# Patient Record
Sex: Female | Born: 1940 | Race: White | Hispanic: No | Smoking: Former smoker
Health system: Southern US, Community
[De-identification: ages and names within clinical notes are randomized; demographics above are authoritative.]

## PROBLEM LIST (undated history)

## (undated) DIAGNOSIS — I10 Essential (primary) hypertension: Secondary | ICD-10-CM

## (undated) DIAGNOSIS — F329 Major depressive disorder, single episode, unspecified: Secondary | ICD-10-CM

## (undated) DIAGNOSIS — K219 Gastro-esophageal reflux disease without esophagitis: Secondary | ICD-10-CM

## (undated) DIAGNOSIS — E119 Type 2 diabetes mellitus without complications: Secondary | ICD-10-CM

## (undated) DIAGNOSIS — F32A Depression, unspecified: Secondary | ICD-10-CM

## (undated) DIAGNOSIS — J189 Pneumonia, unspecified organism: Secondary | ICD-10-CM

## (undated) DIAGNOSIS — Z87442 Personal history of urinary calculi: Secondary | ICD-10-CM

## (undated) DIAGNOSIS — I2510 Atherosclerotic heart disease of native coronary artery without angina pectoris: Secondary | ICD-10-CM

## (undated) DIAGNOSIS — I209 Angina pectoris, unspecified: Secondary | ICD-10-CM

## (undated) DIAGNOSIS — H269 Unspecified cataract: Secondary | ICD-10-CM

## (undated) DIAGNOSIS — F419 Anxiety disorder, unspecified: Secondary | ICD-10-CM

## (undated) DIAGNOSIS — M1990 Unspecified osteoarthritis, unspecified site: Secondary | ICD-10-CM

## (undated) DIAGNOSIS — N189 Chronic kidney disease, unspecified: Secondary | ICD-10-CM

## (undated) DIAGNOSIS — M2040 Other hammer toe(s) (acquired), unspecified foot: Secondary | ICD-10-CM

## (undated) DIAGNOSIS — R0609 Other forms of dyspnea: Secondary | ICD-10-CM

## (undated) DIAGNOSIS — E785 Hyperlipidemia, unspecified: Secondary | ICD-10-CM

## (undated) DIAGNOSIS — R55 Syncope and collapse: Principal | ICD-10-CM

## (undated) HISTORY — DX: Pneumonia, unspecified organism: J18.9

## (undated) HISTORY — DX: Gastro-esophageal reflux disease without esophagitis: K21.9

## (undated) HISTORY — DX: Depression, unspecified: F32.A

## (undated) HISTORY — DX: Hyperlipidemia, unspecified: E78.5

## (undated) HISTORY — DX: Unspecified cataract: H26.9

## (undated) HISTORY — DX: Other hammer toe(s) (acquired), unspecified foot: M20.40

## (undated) HISTORY — PX: EYE SURGERY: SHX253

## (undated) HISTORY — DX: Essential (primary) hypertension: I10

## (undated) HISTORY — DX: Atherosclerotic heart disease of native coronary artery without angina pectoris: I25.10

## (undated) HISTORY — PX: CATARACT EXTRACTION W/ INTRAOCULAR LENS  IMPLANT, BILATERAL: SHX1307

## (undated) HISTORY — DX: Major depressive disorder, single episode, unspecified: F32.9

## (undated) HISTORY — PX: APPENDECTOMY: SHX54

## (undated) HISTORY — DX: Chronic kidney disease, unspecified: N18.9

---
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
## 1969-08-27 HISTORY — PX: ABDOMINAL HYSTERECTOMY: SHX81

## 1998-07-29 ENCOUNTER — Ambulatory Visit (HOSPITAL_COMMUNITY): Admission: RE | Admit: 1998-07-29 | Discharge: 1998-07-29 | Payer: Self-pay | Admitting: Endocrinology

## 1998-11-11 ENCOUNTER — Inpatient Hospital Stay (HOSPITAL_COMMUNITY): Admission: EM | Admit: 1998-11-11 | Discharge: 1998-11-13 | Payer: Self-pay | Admitting: Emergency Medicine

## 1998-12-31 ENCOUNTER — Other Ambulatory Visit: Admission: RE | Admit: 1998-12-31 | Discharge: 1998-12-31 | Payer: Self-pay | Admitting: Gastroenterology

## 1999-01-09 ENCOUNTER — Ambulatory Visit (HOSPITAL_COMMUNITY): Admission: RE | Admit: 1999-01-09 | Discharge: 1999-01-09 | Payer: Self-pay | Admitting: Gastroenterology

## 1999-01-26 ENCOUNTER — Ambulatory Visit (HOSPITAL_COMMUNITY): Admission: RE | Admit: 1999-01-26 | Discharge: 1999-01-26 | Payer: Self-pay | Admitting: Gastroenterology

## 1999-06-24 ENCOUNTER — Ambulatory Visit (HOSPITAL_COMMUNITY): Admission: RE | Admit: 1999-06-24 | Discharge: 1999-06-24 | Payer: Self-pay | Admitting: Endocrinology

## 1999-06-24 ENCOUNTER — Encounter: Payer: Self-pay | Admitting: Endocrinology

## 2000-06-17 ENCOUNTER — Ambulatory Visit (HOSPITAL_COMMUNITY): Admission: RE | Admit: 2000-06-17 | Discharge: 2000-06-17 | Payer: Self-pay | Admitting: Cardiology

## 2000-08-24 ENCOUNTER — Ambulatory Visit (HOSPITAL_COMMUNITY): Admission: RE | Admit: 2000-08-24 | Discharge: 2000-08-24 | Payer: Self-pay | Admitting: Family Medicine

## 2000-08-24 ENCOUNTER — Encounter: Payer: Self-pay | Admitting: Family Medicine

## 2000-10-25 ENCOUNTER — Encounter (INDEPENDENT_AMBULATORY_CARE_PROVIDER_SITE_OTHER): Payer: Self-pay

## 2000-10-25 ENCOUNTER — Other Ambulatory Visit: Admission: RE | Admit: 2000-10-25 | Discharge: 2000-10-25 | Payer: Self-pay | Admitting: Gastroenterology

## 2001-12-08 ENCOUNTER — Ambulatory Visit (HOSPITAL_COMMUNITY): Admission: RE | Admit: 2001-12-08 | Discharge: 2001-12-08 | Payer: Self-pay | Admitting: Family Medicine

## 2001-12-08 ENCOUNTER — Encounter: Payer: Self-pay | Admitting: Family Medicine

## 2001-12-22 ENCOUNTER — Observation Stay (HOSPITAL_COMMUNITY): Admission: AD | Admit: 2001-12-22 | Discharge: 2001-12-26 | Payer: Self-pay | Admitting: Cardiology

## 2002-04-24 ENCOUNTER — Encounter: Payer: Self-pay | Admitting: Family Medicine

## 2002-04-24 ENCOUNTER — Encounter: Admission: RE | Admit: 2002-04-24 | Discharge: 2002-04-24 | Payer: Self-pay | Admitting: Family Medicine

## 2002-12-10 ENCOUNTER — Encounter: Payer: Self-pay | Admitting: Family Medicine

## 2002-12-10 ENCOUNTER — Ambulatory Visit (HOSPITAL_COMMUNITY): Admission: RE | Admit: 2002-12-10 | Discharge: 2002-12-10 | Payer: Self-pay | Admitting: Family Medicine

## 2003-04-15 ENCOUNTER — Other Ambulatory Visit: Admission: RE | Admit: 2003-04-15 | Discharge: 2003-04-15 | Payer: Self-pay | Admitting: Family Medicine

## 2004-11-04 ENCOUNTER — Ambulatory Visit: Payer: Self-pay | Admitting: Family Medicine

## 2004-12-30 ENCOUNTER — Ambulatory Visit: Payer: Self-pay | Admitting: Family Medicine

## 2004-12-31 ENCOUNTER — Ambulatory Visit: Payer: Self-pay | Admitting: Cardiology

## 2005-01-07 ENCOUNTER — Ambulatory Visit: Payer: Self-pay

## 2005-01-09 ENCOUNTER — Encounter: Admission: RE | Admit: 2005-01-09 | Discharge: 2005-01-09 | Payer: Self-pay | Admitting: Family Medicine

## 2005-01-12 ENCOUNTER — Encounter: Admission: RE | Admit: 2005-01-12 | Discharge: 2005-01-12 | Payer: Self-pay | Admitting: Family Medicine

## 2005-01-14 ENCOUNTER — Ambulatory Visit: Payer: Self-pay | Admitting: Gastroenterology

## 2005-01-15 ENCOUNTER — Ambulatory Visit: Payer: Self-pay | Admitting: Gastroenterology

## 2005-01-19 ENCOUNTER — Ambulatory Visit: Payer: Self-pay | Admitting: Gastroenterology

## 2005-01-19 ENCOUNTER — Ambulatory Visit (HOSPITAL_COMMUNITY): Admission: RE | Admit: 2005-01-19 | Discharge: 2005-01-19 | Payer: Self-pay | Admitting: Gastroenterology

## 2005-01-27 ENCOUNTER — Ambulatory Visit: Payer: Self-pay | Admitting: Family Medicine

## 2005-01-27 ENCOUNTER — Ambulatory Visit: Payer: Self-pay | Admitting: Cardiology

## 2005-01-27 ENCOUNTER — Inpatient Hospital Stay (HOSPITAL_COMMUNITY): Admission: EM | Admit: 2005-01-27 | Discharge: 2005-01-28 | Payer: Self-pay | Admitting: Emergency Medicine

## 2005-02-03 ENCOUNTER — Ambulatory Visit: Payer: Self-pay | Admitting: Family Medicine

## 2005-02-14 ENCOUNTER — Ambulatory Visit: Payer: Self-pay | Admitting: Internal Medicine

## 2005-02-15 ENCOUNTER — Inpatient Hospital Stay (HOSPITAL_COMMUNITY): Admission: EM | Admit: 2005-02-15 | Discharge: 2005-02-16 | Payer: Self-pay | Admitting: Emergency Medicine

## 2005-02-16 ENCOUNTER — Encounter: Payer: Self-pay | Admitting: Cardiology

## 2005-03-23 ENCOUNTER — Ambulatory Visit: Payer: Self-pay | Admitting: Family Medicine

## 2005-05-14 ENCOUNTER — Ambulatory Visit: Payer: Self-pay | Admitting: Family Medicine

## 2005-09-13 ENCOUNTER — Ambulatory Visit: Payer: Self-pay | Admitting: Internal Medicine

## 2005-09-16 ENCOUNTER — Ambulatory Visit: Payer: Self-pay

## 2005-10-28 ENCOUNTER — Ambulatory Visit: Payer: Self-pay | Admitting: Internal Medicine

## 2006-02-03 ENCOUNTER — Ambulatory Visit: Payer: Self-pay | Admitting: Internal Medicine

## 2006-03-01 ENCOUNTER — Encounter: Admission: RE | Admit: 2006-03-01 | Discharge: 2006-03-01 | Payer: Self-pay | Admitting: Internal Medicine

## 2006-03-04 ENCOUNTER — Ambulatory Visit: Payer: Self-pay | Admitting: Internal Medicine

## 2006-05-30 ENCOUNTER — Ambulatory Visit: Payer: Self-pay | Admitting: Internal Medicine

## 2006-08-01 ENCOUNTER — Ambulatory Visit: Payer: Self-pay | Admitting: Internal Medicine

## 2006-08-22 ENCOUNTER — Ambulatory Visit: Payer: Self-pay | Admitting: Internal Medicine

## 2006-09-14 ENCOUNTER — Ambulatory Visit: Payer: Self-pay | Admitting: Cardiology

## 2006-09-16 ENCOUNTER — Ambulatory Visit: Payer: Self-pay | Admitting: Cardiology

## 2006-10-11 ENCOUNTER — Ambulatory Visit: Payer: Self-pay

## 2006-10-24 ENCOUNTER — Ambulatory Visit: Payer: Self-pay | Admitting: Cardiology

## 2007-01-31 ENCOUNTER — Ambulatory Visit: Payer: Self-pay | Admitting: Family Medicine

## 2007-01-31 LAB — CONVERTED CEMR LAB
AST: 25 units/L (ref 0–37)
Albumin: 4 g/dL (ref 3.5–5.2)
Chloride: 100 meq/L (ref 96–112)
Cholesterol: 275 mg/dL (ref 0–200)
Direct LDL: 182 mg/dL
GFR calc Af Amer: 45 mL/min
GFR calc non Af Amer: 37 mL/min
Hgb A1c MFr Bld: 5.5 % (ref 4.6–6.0)
Potassium: 3.8 meq/L (ref 3.5–5.1)
Sodium: 139 meq/L (ref 135–145)
Total CHOL/HDL Ratio: 5.3
Triglycerides: 204 mg/dL (ref 0–149)
VLDL: 41 mg/dL — ABNORMAL HIGH (ref 0–40)

## 2007-02-08 ENCOUNTER — Ambulatory Visit: Payer: Self-pay | Admitting: Internal Medicine

## 2007-02-08 LAB — CONVERTED CEMR LAB
BUN: 24 mg/dL — ABNORMAL HIGH (ref 6–23)
Calcium: 9.1 mg/dL (ref 8.4–10.5)
Chloride: 100 meq/L (ref 96–112)
GFR calc Af Amer: 64 mL/min
GFR calc non Af Amer: 53 mL/min

## 2007-02-28 ENCOUNTER — Encounter: Admission: RE | Admit: 2007-02-28 | Discharge: 2007-02-28 | Payer: Self-pay | Admitting: Orthopedic Surgery

## 2007-03-01 ENCOUNTER — Ambulatory Visit (HOSPITAL_BASED_OUTPATIENT_CLINIC_OR_DEPARTMENT_OTHER): Admission: RE | Admit: 2007-03-01 | Discharge: 2007-03-01 | Payer: Self-pay | Admitting: Orthopedic Surgery

## 2007-03-01 ENCOUNTER — Encounter (INDEPENDENT_AMBULATORY_CARE_PROVIDER_SITE_OTHER): Payer: Self-pay | Admitting: *Deleted

## 2007-03-24 ENCOUNTER — Ambulatory Visit: Payer: Self-pay | Admitting: Internal Medicine

## 2007-03-24 LAB — CONVERTED CEMR LAB
Cholesterol: 172 mg/dL (ref 0–200)
HDL: 43.9 mg/dL (ref >39.0)
Total CHOL/HDL Ratio: 3.9
Triglycerides: 250 mg/dL (ref 0–149)

## 2007-03-28 ENCOUNTER — Encounter: Admission: RE | Admit: 2007-03-28 | Discharge: 2007-03-28 | Payer: Self-pay | Admitting: Internal Medicine

## 2007-04-28 ENCOUNTER — Ambulatory Visit: Payer: Self-pay | Admitting: Cardiology

## 2007-05-19 DIAGNOSIS — R739 Hyperglycemia, unspecified: Secondary | ICD-10-CM | POA: Insufficient documentation

## 2007-05-19 DIAGNOSIS — I2510 Atherosclerotic heart disease of native coronary artery without angina pectoris: Secondary | ICD-10-CM | POA: Insufficient documentation

## 2007-05-19 DIAGNOSIS — Z8719 Personal history of other diseases of the digestive system: Secondary | ICD-10-CM | POA: Insufficient documentation

## 2007-05-19 DIAGNOSIS — Z9079 Acquired absence of other genital organ(s): Secondary | ICD-10-CM | POA: Insufficient documentation

## 2007-05-19 DIAGNOSIS — I10 Essential (primary) hypertension: Secondary | ICD-10-CM | POA: Insufficient documentation

## 2007-05-19 DIAGNOSIS — Z9089 Acquired absence of other organs: Secondary | ICD-10-CM | POA: Insufficient documentation

## 2007-05-19 DIAGNOSIS — E785 Hyperlipidemia, unspecified: Secondary | ICD-10-CM | POA: Insufficient documentation

## 2007-05-19 DIAGNOSIS — F325 Major depressive disorder, single episode, in full remission: Secondary | ICD-10-CM | POA: Insufficient documentation

## 2007-05-30 ENCOUNTER — Telehealth: Payer: Self-pay | Admitting: Internal Medicine

## 2007-06-13 ENCOUNTER — Ambulatory Visit: Payer: Self-pay | Admitting: Internal Medicine

## 2007-06-13 LAB — CONVERTED CEMR LAB
Albumin: 4 g/dL (ref 3.5–5.2)
Alkaline Phosphatase: 52 units/L (ref 39–117)
Direct LDL: 101.7 mg/dL
HDL: 45.7 mg/dL (ref >39.0)
Hgb A1c MFr Bld: 5.9 % (ref 4.6–6.0)
Total CHOL/HDL Ratio: 4.4
Triglycerides: 207 mg/dL (ref 0–149)

## 2007-06-21 ENCOUNTER — Ambulatory Visit: Payer: Self-pay | Admitting: Internal Medicine

## 2007-07-04 ENCOUNTER — Ambulatory Visit: Payer: Self-pay | Admitting: Internal Medicine

## 2007-07-04 DIAGNOSIS — J189 Pneumonia, unspecified organism: Secondary | ICD-10-CM

## 2007-07-04 HISTORY — DX: Pneumonia, unspecified organism: J18.9

## 2007-07-05 ENCOUNTER — Ambulatory Visit: Payer: Self-pay | Admitting: Internal Medicine

## 2007-07-14 ENCOUNTER — Encounter: Admission: RE | Admit: 2007-07-14 | Discharge: 2007-07-14 | Payer: Self-pay | Admitting: Internal Medicine

## 2007-10-20 ENCOUNTER — Telehealth (INDEPENDENT_AMBULATORY_CARE_PROVIDER_SITE_OTHER): Payer: Self-pay | Admitting: *Deleted

## 2007-12-07 ENCOUNTER — Telehealth (INDEPENDENT_AMBULATORY_CARE_PROVIDER_SITE_OTHER): Payer: Self-pay | Admitting: *Deleted

## 2007-12-15 ENCOUNTER — Telehealth (INDEPENDENT_AMBULATORY_CARE_PROVIDER_SITE_OTHER): Payer: Self-pay | Admitting: *Deleted

## 2008-04-30 ENCOUNTER — Encounter: Admission: RE | Admit: 2008-04-30 | Discharge: 2008-04-30 | Payer: Self-pay | Admitting: Internal Medicine

## 2008-06-12 ENCOUNTER — Encounter: Admission: RE | Admit: 2008-06-12 | Discharge: 2008-06-12 | Payer: Self-pay | Admitting: Internal Medicine

## 2008-06-12 ENCOUNTER — Ambulatory Visit: Payer: Self-pay | Admitting: Cardiology

## 2008-07-25 ENCOUNTER — Telehealth: Payer: Self-pay | Admitting: Gastroenterology

## 2008-08-01 ENCOUNTER — Ambulatory Visit: Payer: Self-pay | Admitting: Cardiology

## 2008-08-01 LAB — CONVERTED CEMR LAB
ALT: 18 units/L (ref 0–35)
AST: 20 units/L (ref 0–37)
Bilirubin, Direct: 0.1 mg/dL (ref 0.0–0.3)
Cholesterol: 165 mg/dL (ref 0–200)
HDL: 48.6 mg/dL (ref >39.0)
Total Bilirubin: 0.8 mg/dL (ref 0.3–1.2)
Total Protein: 6.6 g/dL (ref 6.0–8.3)
VLDL: 18 mg/dL (ref 0–40)

## 2008-11-29 ENCOUNTER — Encounter: Payer: Self-pay | Admitting: Obstetrics and Gynecology

## 2008-11-29 ENCOUNTER — Other Ambulatory Visit: Admission: RE | Admit: 2008-11-29 | Discharge: 2008-11-29 | Payer: Self-pay | Admitting: Obstetrics and Gynecology

## 2008-11-29 ENCOUNTER — Ambulatory Visit: Payer: Self-pay | Admitting: Obstetrics and Gynecology

## 2009-01-07 ENCOUNTER — Ambulatory Visit: Payer: Self-pay | Admitting: Obstetrics and Gynecology

## 2009-07-21 ENCOUNTER — Encounter: Admission: RE | Admit: 2009-07-21 | Discharge: 2009-07-21 | Payer: Self-pay | Admitting: Internal Medicine

## 2009-09-06 DIAGNOSIS — I259 Chronic ischemic heart disease, unspecified: Secondary | ICD-10-CM | POA: Insufficient documentation

## 2009-09-06 DIAGNOSIS — Z87898 Personal history of other specified conditions: Secondary | ICD-10-CM | POA: Insufficient documentation

## 2009-09-06 DIAGNOSIS — Z9189 Other specified personal risk factors, not elsewhere classified: Secondary | ICD-10-CM | POA: Insufficient documentation

## 2009-09-06 DIAGNOSIS — R609 Edema, unspecified: Secondary | ICD-10-CM | POA: Insufficient documentation

## 2009-09-06 DIAGNOSIS — K219 Gastro-esophageal reflux disease without esophagitis: Secondary | ICD-10-CM | POA: Insufficient documentation

## 2009-09-08 ENCOUNTER — Ambulatory Visit: Payer: Self-pay | Admitting: Cardiology

## 2009-09-08 DIAGNOSIS — R42 Dizziness and giddiness: Secondary | ICD-10-CM | POA: Insufficient documentation

## 2009-10-09 ENCOUNTER — Encounter: Payer: Self-pay | Admitting: Internal Medicine

## 2009-10-09 LAB — CONVERTED CEMR LAB: LDL Cholesterol: 96 mg/dL

## 2009-10-13 ENCOUNTER — Telehealth (INDEPENDENT_AMBULATORY_CARE_PROVIDER_SITE_OTHER): Payer: Self-pay | Admitting: *Deleted

## 2009-10-17 ENCOUNTER — Telehealth: Payer: Self-pay | Admitting: Cardiology

## 2009-10-21 ENCOUNTER — Encounter: Payer: Self-pay | Admitting: Internal Medicine

## 2009-10-29 ENCOUNTER — Ambulatory Visit: Payer: Self-pay | Admitting: Women's Health

## 2009-11-07 ENCOUNTER — Ambulatory Visit: Payer: Self-pay | Admitting: Women's Health

## 2010-01-02 ENCOUNTER — Encounter (INDEPENDENT_AMBULATORY_CARE_PROVIDER_SITE_OTHER): Payer: Self-pay | Admitting: *Deleted

## 2010-01-29 ENCOUNTER — Encounter (INDEPENDENT_AMBULATORY_CARE_PROVIDER_SITE_OTHER): Payer: Self-pay | Admitting: *Deleted

## 2010-01-29 ENCOUNTER — Ambulatory Visit: Payer: Self-pay | Admitting: Gastroenterology

## 2010-02-13 ENCOUNTER — Ambulatory Visit: Payer: Self-pay | Admitting: Gastroenterology

## 2010-02-18 ENCOUNTER — Encounter: Payer: Self-pay | Admitting: Gastroenterology

## 2010-08-10 ENCOUNTER — Encounter: Payer: Self-pay | Admitting: Cardiology

## 2010-10-22 ENCOUNTER — Encounter: Admission: RE | Admit: 2010-10-22 | Discharge: 2010-10-22 | Payer: Self-pay | Admitting: Internal Medicine

## 2010-10-28 ENCOUNTER — Encounter: Payer: Self-pay | Admitting: Cardiology

## 2010-11-02 ENCOUNTER — Ambulatory Visit: Payer: Self-pay | Admitting: Cardiology

## 2010-12-27 HISTORY — PX: VEIN LIGATION AND STRIPPING: SHX2653

## 2011-01-28 NOTE — Letter (Signed)
Summary: Patient Notice- Polyp Results  [facility]  [address] [place], [place] [address]   Phone: [phone]  Fax: [phone]        February 18, 2010 MRN: [id number]    [name] [address] [place], [place]  [address]    Dear [name],  I am pleased to inform you that the colon polyp(s) removed during your recent colonoscopy was (were) found to be benign (no cancer detected) upon pathologic examination.  I recommend you have a repeat colonoscopy examination in _5 years to look for recurrent polyps, as having colon polyps increases your risk for having recurrent polyps or even colon cancer in the future.  Should you develop new or worsening symptoms of abdominal pain, bowel habit changes or bleeding from the rectum or bowels, please schedule an evaluation with either your primary care physician or with me.  Additional information/recommendations:  __ No further action with gastroenterology is needed at this time. Please      follow-up with your primary care physician for your other healthcare      needs.  __ Please call [phone] to schedule a return visit to review your      situation.  __ Please keep your follow-up visit as already scheduled.  _x_ Continue treatment plan as outlined the day of your exam.  Please call us if you are having persistent problems or have questions about your condition that have not been fully answered at this time.  Sincerely,  Louis Meckel MD  This letter has been electronically signed by your physician.  Appended Document: Patient Notice- Polyp Results  Letter mailed 2.24.11

## 2011-01-28 NOTE — Letter (Signed)
Summary: [facility]  [facility]   Imported By: Marylou Mccoy 11/16/2010 14:48:44  _____________________________________________________________________  External Attachment:    Type:   Image     Comment:   External Document

## 2011-01-28 NOTE — Assessment & Plan Note (Signed)
Summary: 1 year fu appt/mt  Medications Added ASPIRIN 81 MG TBEC (ASPIRIN) Take one tablet by mouth daily        CC:  pt states she has been having mood swings thinks its related to meds.  History of Present Illness: [name] is a pleasant female who has a history of mild coronary artery disease by previous catheterization in February 2006.  At that time, she had a 30% LAD, 30% circumflex and irregularities in the right coronary artery.  Her ejection fraction was 60%.  An echocardiogram in January 2006, showed normal LV function and trace mitral and tricuspid regurgitation.  She has also had a previous abdominal ultrasound that showed no aneurysm in October 2007.  Carotid Dopplers in October 2007, showed 0-39% bilateral internal carotid artery stenosis. I last saw her in Sept of 2009. Since then the patient denies any dyspnea on exertion, orthopnea, PND, pedal edema, palpitations, syncope or chest pain.   Current Medications (verified): 1)  Pravastatin Sodium 80 Mg Tabs (Pravastatin Sodium) .... Take One Tablet By Mouth Daily At Bedtime 2)  Lasix 20 Mg Tabs (Furosemide) .... Take 1 Tablet By Mouth Once A Day 3)  Omeprazole 20 Mg Cpdr (Omeprazole) .Marland Kitchen.. 1 Tab By Mouth Once Daily 4)  Zolpidem Tartrate 10 Mg Tabs (Zolpidem Tartrate) .... At Bedtime 5)  Aspirin 81 Mg Tbec (Aspirin) .... Take One Tablet By Mouth Daily  Allergies: No Known Drug Allergies  Past History:  Past Medical History: GERD HYPERTENSION (ICD-401.9) DEPRESSION (ICD-311) CORONARY ARTERY DISEASE (ICD-414.00) HYPERLIPIDEMIA (ICD-272.4)  Past Surgical History: * VEIN STRIPPING PROCEDURE. TOTAL HYSTERECTOMY AND BILATERAL SALPINGOOPHERECTOMY, HX OF (ICD-V45.77) TONSILLECTOMY AND ADENOIDECTOMY, HX OF (ICD-V45.79)  Social History: Reviewed history from 09/06/2009 and no changes required. Married 3 three adult children alive and well,one with diabetes. Tobacco Use - No.  Alcohol Use - no  Review of Systems    Some problems with depression as her brother died in 2023/08/27; no fevers or chills, productive cough, hemoptysis, dysphasia, odynophagia, melena, hematochezia, dysuria, hematuria, rash, seizure activity, orthopnea, PND, pedal edema, claudication. Remaining systems are negative.   Vital Signs:  Patient profile:   70 year old female Height:      62 inches Weight:      202 pounds BMI:     37.08 Pulse rate:   82 / minute Resp:     14 per minute BP sitting:   141 / 94  (left arm)  Vitals Entered By: Kem Parkinson (November 02, 2010 12:35 PM)  Physical Exam  General:  Well-developed well-nourished in no acute distress.  Skin is warm and dry.  HEENT is normal.  Neck is supple. No thyromegaly.  Chest is clear to auscultation with normal expansion.  Cardiovascular exam is regular rate and rhythm.  Abdominal exam nontender or distended. No masses palpated. Extremities show no edema. neuro grossly intact    EKG  Procedure date:  11/02/2010  Findings:      Sinus rhythm at a rate of 77. Axis normal. No significant ST changes.  Impression & Recommendations:  Problem # 1:  CORONARY ARTERY DISEASE (ICD-414.00) Continue aspirin. Continue statin. No recent chest pain. The following medications were removed from the medication list:    Metoprolol Tartrate 25 Mg Tabs (Metoprolol tartrate) .Marland Kitchen... Take 1 tablet by mouth twice a day Her updated medication list for this problem includes:    Aspirin 81 Mg Tbec (Aspirin) .Marland Kitchen... Take one tablet by mouth daily  Problem # 2:  GERD (ICD-530.81)  Her updated medication list for this problem includes:    Omeprazole 20 Mg Cpdr (Omeprazole) .Marland Kitchen... 1 tab by mouth once daily  Problem # 3:  HYPERTENSION (ICD-401.9) Blood pressure elevated. I have asked her to follow this at home. We will add additional medications to keep her systolic blood pressure at 130 or less and her diastolic at 85 or less. The following medications were removed from the  medication list:    Metoprolol Tartrate 25 Mg Tabs (Metoprolol tartrate) .Marland Kitchen... Take 1 tablet by mouth twice a day Her updated medication list for this problem includes:    Lasix 20 Mg Tabs (Furosemide) .Marland Kitchen... Take 1 tablet by mouth once a day    Aspirin 81 Mg Tbec (Aspirin) .Marland Kitchen... Take one tablet by mouth daily  Problem # 4:  HYPERLIPIDEMIA (ICD-272.4) Continue statin. Lipids and liver monitored by primary care. Her updated medication list for this problem includes:    Pravastatin Sodium 80 Mg Tabs (Pravastatin sodium) .Marland Kitchen... Take one tablet by mouth daily at bedtime  Patient Instructions: 1)  Your physician recommends that you schedule a follow-up appointment in: ONE YEAR

## 2011-01-28 NOTE — Letter (Signed)
Summary: [facility] Instructions  [facility]  [address] [place], [place] [address]   Phone: [phone]  Fax: [phone]       [name]    October 30, 1941    MRN: [id number]        Procedure Day /Date:  Friday 02/13/2010     Arrival Time:  10:00 am      Procedure Time: 11:00 am     Location of Procedure:                    _ x_  [facility] (4th Floor)   PREPARATION FOR COLONOSCOPY WITH MOVIPREP   Starting 5 days prior to your procedure Sunday 2/13 do not eat nuts, seeds, popcorn, corn, beans, peas,  salads, or any raw vegetables.  Do not take any fiber supplements (e.g. Metamucil, Citrucel, and Benefiber).  THE DAY BEFORE YOUR PROCEDURE         DATE: Thursday 2/17  1.  Drink clear liquids the entire day-NO SOLID FOOD  2.  Do not drink anything colored red or purple.  Avoid juices with pulp.  No orange juice.  3.  Drink at least 64 oz. (8 glasses) of fluid/clear liquids during the day to prevent dehydration and help the prep work efficiently.  CLEAR LIQUIDS INCLUDE: Water Jello Ice Popsicles Tea (sugar ok, no milk/cream) Powdered fruit flavored drinks Coffee (sugar ok, no milk/cream) Gatorade Juice: apple, white grape, white cranberry  Lemonade Clear bullion, consomm, broth Carbonated beverages (any kind) Strained chicken noodle soup Hard Candy                             4.  In the morning, mix first dose of MoviPrep solution:    Empty 1 Pouch A and 1 Pouch B into the disposable container    Add lukewarm drinking water to the top line of the container. Mix to dissolve    Refrigerate (mixed solution should be used within 24 hrs)  5.  Begin drinking the prep at 5:00 p.m. The MoviPrep container is divided by 4 marks.   Every 15 minutes drink the solution down to the next mark (approximately 8 oz) until the full liter is complete.   6.  Follow completed prep with 16 oz of clear liquid of your choice (Nothing red or purple).   Continue to drink clear liquids until bedtime.  7.  Before going to bed, mix second dose of MoviPrep solution:    Empty 1 Pouch A and 1 Pouch B into the disposable container    Add lukewarm drinking water to the top line of the container. Mix to dissolve    Refrigerate  THE DAY OF YOUR PROCEDURE      DATE: Friday 2/18  Beginning at 6:00 a.m. (5 hours before procedure):         1. Every 15 minutes, drink the solution down to the next mark (approx 8 oz) until the full liter is complete.  2. Follow completed prep with 16 oz. of clear liquid of your choice.    3. You may drink clear liquids until 9:00 am (2 HOURS BEFORE PROCEDURE).   MEDICATION INSTRUCTIONS  Unless otherwise instructed, you should take regular prescription medications with a small sip of water   as early as possible the morning of your procedure.   Additional medication instructions:  Hold Furosemide day of procedure.  OTHER INSTRUCTIONS  You will need a responsible adult at least 69 years of age to accompany you and drive you home.   This person must remain in the waiting room during your procedure.  Wear loose fitting clothing that is easily removed.  Leave jewelry and other valuables at home.  However, you may wish to bring a book to read or  an iPod/MP3 player to listen to music as you wait for your procedure to start.  Remove all body piercing jewelry and leave at home.  Total time from sign-in until discharge is approximately 2-3 hours.  You should go home directly after your procedure and rest.  You can resume normal activities the  day after your procedure.  The day of your procedure you should not:   Drive   Make legal decisions   Operate machinery   Drink alcohol   Return to work  You will receive specific instructions about eating, activities and medications before you leave.    The above instructions have been reviewed and explained to me by   Ezra Sites RN  January 29, 2010 9:13 AM     I fully understand and can verbalize these instructions _____________________________ Date _________

## 2011-01-28 NOTE — Miscellaneous (Signed)
  Clinical Lists Changes  Medications: Removed medication of DEXILANT 60 MG CPDR (DEXLANSOPRAZOLE) by mouth qam #34, 5 refills Removed medication of OMEPRAZOLE 40 MG  CPDR (OMEPRAZOLE) 1 each day 30 minutes before meal by mouth qpm #34, 5 refills Removed medication of ROBINUL-FORTE 2 MG  TABS (GLYCOPYRROLATE) 1 by mouth two times a day as needed #34, 11 refills

## 2011-01-28 NOTE — Procedures (Signed)
Summary: Upper Endoscopy  Patient: [name] Note: All result statuses are Final unless otherwise noted.  Tests: (1) Upper Endoscopy (EGD)   EGD Upper Endoscopy       DONE     [facility]     [address]     [place], [place]  [address]           ENDOSCOPY PROCEDURE REPORT           PATIENT:  Sania, Noy  MR#:  [id number]     BIRTHDATE:  [birth year], 69 yrs. old  GENDER:  female           ENDOSCOPIST:  Barbette Hair. Arlyce Dice, MD     Referred by:           PROCEDURE DATE:  02/13/2010     PROCEDURE:  EGD, diagnostic     ASA CLASS:  Class II     INDICATIONS:  history of duodenal polyp           MEDICATIONS:   There was residual sedation effect present from     prior procedure., Versed 2 mg IV, glycopyrrolate (Robinal) 0.2 mg     IV, 0.6cc simethancone 0.6 cc PO     TOPICAL ANESTHETIC:  Exactacain Spray           DESCRIPTION OF PROCEDURE:   After the risks benefits and     alternatives of the procedure were thoroughly explained, informed     consent was obtained.  The LB GIF-H180 [id number] endoscope was     introduced through the mouth and advanced to the third portion of     the duodenum, without limitations.  The instrument was slowly     withdrawn as the mucosa was fully examined.     <<PROCEDUREIMAGES>>           The duodenal bulb was normal in appearance, as was the postbulbar     duodenum (see image1, image2, image3, image4, and image5).  The     upper, middle, and distal third of the esophagus were carefully     inspected and no abnormalities were noted. The z-line was well     seen at the GEJ. The endoscope was pushed into the fundus which     was normal including a retroflexed view. The antrum,gastric body,     first and second part of the duodenum were unremarkable.     Retroflexed views revealed no abnormalities.    The scope was then     withdrawn from the patient and the procedure completed.           COMPLICATIONS:  None           ENDOSCOPIC  IMPRESSION:     1) Normal duodenum     2) Normal EGD     RECOMMENDATIONS:     1) follow-up: GI lab PRN           REPEAT EXAM:  No           ______________________________     Barbette Hair. Arlyce Dice, MD           CC:  Willow Ora, MD           n.     Rosalie Doctor:   Barbette Hair. Kaplan at 02/13/2010 11:35 AM           Lenard Simmer, [id number]  Note: An exclamation mark (!) indicates a result that was not dispersed into the flowsheet. Document Creation Date:  02/13/2010 11:36 AM _______________________________________________________________________  (1) Order result status: Final Collection or observation date-time: 02/13/2010 11:21 Requested date-time:  Receipt date-time:  Reported date-time:  Referring Physician:   Ordering Physician: Melvia [name] [phone]) Specimen Source:  Source: Launa Grill Order Number: [id number] Lab site:

## 2011-01-28 NOTE — Miscellaneous (Signed)
Summary: [facility]  Clinical Lists Changes  Medications: Added new medication of MOVIPREP 100 GM  SOLR (PEG-KCL-NACL-NASULF-NA ASC-C) As per prep instructions. - Signed Rx of MOVIPREP 100 GM  SOLR (PEG-KCL-NACL-NASULF-NA ASC-C) As per prep instructions.;  #1 x 0;  Signed;  Entered by: Ezra Sites RN;  Authorized by: Louis Meckel MD;  Method used: Electronically to [id number] [address] [place], [place], [place]  [address], Ph: [phone], Fax: [phone] Observations: Added new observation of NKA: T (01/29/2010 8:07)    Prescriptions: MOVIPREP 100 GM  SOLR (PEG-KCL-NACL-NASULF-NA ASC-C) As per prep instructions.  #1 x 0   Entered by:   Ezra Sites RN   Authorized by:   Louis Meckel MD   Signed by:   Ezra Sites RN on 01/29/2010   Method used:   Electronically to        [facility] (retail)       [address] [place], [place]  [address]       Ph: [phone]       Fax: [phone]   RxID:   [id number]

## 2011-01-28 NOTE — Letter (Signed)
Summary: Endo/Colon Letter   [facility]  [address] [place], [place] [address]   Phone: [phone]  Fax: [phone]      January 02, 2010 MRN: [id number]   [name] [address] [place], [place]  [address]   Dear [name],   According to your medical record, it is time for you to schedule an Endoscopy/Colonoscopy . Endoscopic screeening is recommended for patients with certain upper digestive tract conditions because of associated increased risk for cancers of the upper digestive system. The American Cancer Society recommends Colonoscopy as a method to detect early colon cancer. Patients with a family history of colon cancer, or a personal history of colon polyps or inflammatory bowel disease are at increased risk.  This letter has been generated based on the recommendations made at the time of your prior procedure. If you feel that in your particular situation this may no longer apply, please contact our office.  Please call our office at [phone] to schedule this appointment or to update your records at your earliest convenience.  Thank you for cooperating with [place] to provide you with the very best care possible.   Sincerely,  Barbette Hair. Arlyce Dice, M.D.  [facility] [phone]

## 2011-01-28 NOTE — Procedures (Signed)
Summary: Colonoscopy  Patient: Zhanna Melin Note: All result statuses are Final unless otherwise noted.  Tests: (1) Colonoscopy (COL)   COL Colonoscopy           DONE     [facility]     [address]     [place], [place]  [address]           COLONOSCOPY PROCEDURE REPORT           PATIENT:  Lamoine, Fredricksen  MR#:  [id number]     BIRTHDATE:  [birth year], 69 yrs. old  GENDER:  female           ENDOSCOPIST:  Barbette Hair. Arlyce Dice, MD     Referred by:           PROCEDURE DATE:  02/13/2010     PROCEDURE:  Colonoscopy with snare polypectomy     ASA CLASS:  Class II     INDICATIONS:  history of pre-cancerous (adenomatous) colon polyps                 MEDICATIONS:   Fentanyl 100 mcg IV, Versed 10 mg IV, Benadryl 50     mg IV           DESCRIPTION OF PROCEDURE:   After the risks benefits and     alternatives of the procedure were thoroughly explained, informed     consent was obtained.  Digital rectal exam was performed and     revealed no abnormalities.   The LB CF-H180AL [id number] endoscope     was introduced through the anus and advanced to the cecum, which     was identified by both the appendix and ileocecal valve, without     limitations.  The quality of the prep was good, using MoviPrep.     The instrument was then slowly withdrawn as the colon was fully     examined.     <<PROCEDUREIMAGES>>           FINDINGS:  A sessile polyp was found in the midascending colon. It     was 8 mm in size. Polyp was snared, then cauterized with monopolar     cautery. Retrieval was successful (see image4). snare polyp  A     sessile polyp was found in the proximal ascending colon. It was 3     mm in size. Polyp was snared without cautery. Retrieval was     successful (see image3). snare polyp  This was otherwise a normal     examination of the colon (see image1, image2, image6, image7,     image8, image9, image12, image13, and image14).   Retroflexed     views in the rectum revealed  no abnormalities.    The scope was     then withdrawn from the patient and the procedure completed.           COMPLICATIONS:  None           ENDOSCOPIC IMPRESSION:     1) 8 mm sessile polyp in the ascending colon     2) 3 mm sessile polyp in the ascending colon     3) Otherwise normal examination     RECOMMENDATIONS:     1) Await biopsy results           REPEAT EXAM:  You will receive a letter from Dr. Arlyce Dice in 1-2     weeks, after reviewing the final pathology, with followup     recommendations.  ______________________________     Barbette Hair Arlyce Dice, MD           CC:  Willow Ora, MD           n.     Rosalie Doctor:   Barbette Hair. Kaplan at 02/13/2010 11:32 AM           Lenard Simmer, [id number]  Note: An exclamation mark (!) indicates a result that was not dispersed into the flowsheet. Document Creation Date: 02/13/2010 11:32 AM _______________________________________________________________________  (1) Order result status: Final Collection or observation date-time: 02/13/2010 11:16 Requested date-time:  Receipt date-time:  Reported date-time:  Referring Physician:   Ordering Physician: Melvia [name] [phone]) Specimen Source:  Source: Launa Grill Order Number: [id number] Lab site:   Appended Document: Colonoscopy     Procedures Next Due Date:    Colonoscopy: 01/2015

## 2011-05-11 NOTE — Assessment & Plan Note (Signed)
[facility]                            CARDIOLOGY OFFICE NOTE   NAME:Albea, [name]                   MRN:          [id number]  DATE:06/12/2008                            DOB:          Nov 30, 1941    [name] is a 70 year old female who has a history of mild coronary  artery disease by previous catheterization in February 2006.  At that  time, she had a 30% LAD, 30% circumflex and irregularities in the right  coronary artery.  Her ejection fraction was 60%.  An echocardiogram in  January 2006, showed normal LV function and trace mitral and tricuspid  regurgitation.  She has also had a previous abdominal ultrasound that  showed no aneurysm in October 2007.  Carotid Dopplers in October 2007,  showed 0-39% bilateral internal carotid artery stenosis.  Since I last  saw her, she is doing well from symptomatic standpoint.  She continues  to have occasional dizzy spells, which have been present for 10 years.  Note, we have worked this up previously without any identifiable cardiac  etiology.  She denies any dyspnea, chest pain, palpitations, or syncope.   MEDICATIONS:  1. Aspirin 81 mg p.o. daily.  2. Toprol 25 mg p.o. daily.  3. Protonix 40 mg p.o. daily.  4. Omeprazole.  5. Pravachol 40 mg p.o. daily.  6. __________ .   PHYSICAL EXAMINATION:  VITAL SIGNS:  Blood pressure of 118/70 and pulse  is 54.  The patient weighs 209 pounds.  HEENT:  Normal.  NECK:  Supple with no bruits.  CHEST:  Clear.  CARDIOVASCULAR:  Regular rate and rhythm.  ABDOMEN:  No tenderness.  EXTREMITIES:  No edema.   Electrocardiogram shows a sinus rhythm at a rate of 54.  The axis is  normal.  There are no ST changes noted.  I did have lipids from February 06, 2008, that showed a total cholesterol of 172 and LDL of 92 and HDL  of 55.  Liver functions were normal.   DIAGNOSES:  1. Hypertension - her blood pressure is adequately controlled on her      present  medications.  2. Hyperlipidemia - she will continue on her statin and this is being      followed by her primary care physician.  3. Dizziness - this is chronic and does not appear to be cardiac in      etiology.  4. Gastroesophageal reflux disease.  5. History of nonobstructive coronary artery disease - she will      continue on her aspirin, beta-blocker, and statin.  We discussed      the importance of diet and exercise.  She does not smoke.   We will see her back in 1 year.     Madolyn Frieze Jens Som, MD, [facility] [address]  Electronically Signed    B[facility]  DD: 06/12/2008  DT: 06/13/2008  Job #: [id number]

## 2011-05-14 ENCOUNTER — Encounter: Payer: Self-pay | Admitting: Cardiology

## 2011-05-14 NOTE — H&P (Signed)
[name], [name]            ACCOUNT NO.:  [id number]   MEDICAL RECORD NO.:  [id number]          PATIENT TYPE:  EMS   LOCATION:  MAJO                         FACILITY:  [facility]   PHYSICIAN:  Valetta Mole. Swords, M.D. [facility] OF BIRTH:  1941-09-29   DATE OF ADMISSION:  02/14/2005  DATE OF DISCHARGE:                                HISTORY & PHYSICAL   CHIEF COMPLAINT:  Syncope.   HISTORY OF PRESENT ILLNESS:  [name] is a 70 year old female who  reports recurrent syncope or presyncope spells.  Last night was the most  severe episode so she came to the emergency department.  Typical symptoms  are as follows:  When she goes from a lying to sitting or sitting to  standing position she feels lightheaded, everything gets black, she feels  hot, sweaty, and then apparently occasionally she passes out but she cannot  remember the details and no family members are here to substantiate her  symptoms.  She does not have any seizure activity, she is told.  She does  not have any nausea or vomiting.  When she tried to stand up in the  emergency department this morning she had recurrent symptoms and it was  decided to admit her for further evaluation.  She denies any headache,  neurologic deficits, or any other associated symptoms.  She knows of no  modifying factors other than positional change.  Her appetite has been  normal.   PAST MEDICAL HISTORY:  1.  Non-obstructive coronary artery disease.  2.  Hypertension.  3.  Hyperlipidemia.  4.  Gastroesophageal reflux disease.  5.  Depression.  6.  Status post tonsillectomy and adenoidectomy.  7.  Appendectomy.  8.  Vein stripping procedure.   CURRENT MEDICATIONS:  1.  Lipitor 40 mg p.o. daily.  2.  Benicar unknown dose.  3.  Aspirin 81 mg p.o. daily.  4.  Fish oil tablets.  5.  Lasix 20 mg every other day.  6.  Protonix 40 mg p.o. daily.  7.  Lexapro 20 mg p.o. daily.   ALLERGIES:  She has no known drug allergies.   SOCIAL HISTORY:   She lives in [place] with her husband.  One child has  diabetes.  She denies any current tobacco use.  She denies any alcohol use  or drug use.  She does not exercise.   FAMILY HISTORY:  Mother deceased at 39 with diabetes and MI.  Father  deceased at 33 with suicide, although he did have a history of coronary  disease.  She has a sister with diabetes.  She has a brother with diabetes  and coronary disease.   REVIEW OF SYSTEMS:  She denies any chest pain, shortness of breath, PND,  orthopnea.  She denies any rashes.  She does have occasional lower extremity  edema, but no shortness of breath.  She does not exercise so it is unclear  whether she has any dyspnea.  Her appetite is normal.  Bowel movements are  normal.  No change in urinary habits.  She denies any other complaints in  review of systems.   PHYSICAL EXAMINATION:  VITAL SIGNS:  Temperature 98, pulse 80, respirations  14, blood pressure 116/70.  GENERAL:  She appears as an obese female in no acute distress.  HEENT:  Atraumatic, normocephalic.  Extraocular movements are intact.  NECK:  Supple without lymphadenopathy, thyromegaly, jugular venous  distention, or carotid bruits.  CHEST:  Clear to auscultation without any increased work of breathing.  CARDIAC:  S1, S2 are normal without any murmurs or gallops.  ABDOMEN:  Obese.  Active bowel sounds.  Soft, nontender.  There is no  hepatosplenomegaly.  EXTREMITIES:  There is no clubbing or cyanosis.  She has trace pretibial  edema.  NEUROLOGIC:  She is alert and oriented without any motor or sensory  deficits.  Cranial nerves are intact.   CK-MB, troponin I, and myoglobin have been normal.  I-stat 8 significant for  a potassium of 3.4 and a glucose of 128.  Creatinine 0.9.   EKG demonstrates sinus bradycardia, otherwise normal.   Recent cardiac catheterization demonstrates minimal non-obstructive coronary  disease with 30% narrowing in the proximal LAD, 30% ostial narrowing  in the  circumflex, and irregular areas in the right coronary artery.   ASSESSMENT/PLAN:  1.  Recurrent syncope.  She needs further evaluation.  Check an      echocardiogram.  She will need further cardiac evaluation, possible tilt      table testing and EP evaluation if indicated.  I will have EP service      see the patient.  2.  Hypertension.  Will check orthostatic blood pressures.  3.  Non-obstructive coronary disease.  Doubt symptomatic.      BHS/MEDQ  D:  02/15/2005  T:  02/15/2005  Job:  [id number]   cc:   Angelena Sole, M.D. [facility]

## 2011-05-14 NOTE — Assessment & Plan Note (Signed)
[facility]                            CARDIOLOGY OFFICE NOTE   NAME:Reist, JALEESA CERVI                   MRN:          [id number]  DATE:04/28/2007                            DOB:          [birth year]    Ms. Poth returns for followup today.  She has a history of mild  coronary disease by previous catheterization in February 2006 with a 30%  LAD, 30% circumflex, and irregularities in the coronary artery.  Since I  saw her she has not had chest pain, shortness of breath, palpitations,  or syncope.  She continues to have occasional dizzy spells.  These last  several minutes and are associated with mild diaphoresis.  There is no  associated palpitations and they are not related to position.  They  occur at any time during the day.  Note we have performed an event  monitor previously that did not show significant arrhythmias.  She also  had carotid Dopplers performed that were unremarkable.  We have also  performed orthostatics in the past that have been negative.  Also her  symptoms are not orthostatic in nature.  Her last echocardiogram in  January of 2006 showed normal LV function and trace mitral and tricuspid  regurgitation.   MEDICATIONS:  1. Aspirin 81 mg p.o. daily.  2. Toprol 25 mg p.o. daily.  3. Protonix 40 mg p.o. daily.  4. Lisinopril HCT 10/12.5 mg p.o. daily.  5. Zetia 10 mg p.o. daily.  6. Pravachol 40 mg p.o. daily.  7. __________.   PHYSICAL EXAMINATION:  Today shows a blood pressure of 125/80 and her  pulse is 78.  She weighs 214 pounds.  CHEST:  Clear.  CARDIOVASCULAR:  Regular rate and rhythm.  EXTREMITIES:  Show no edema.   Her electrocardiogram shows a sinus rhythm at a rate of 75.  The axis is  normal.  There are nonspecific ST changes.   DIAGNOSES:  1. Continued dizziness - the etiology of this is not clear to me,      however the cardiac workup has been negative.  Her left ventricular      function is normal and  there is no carotid stenosis.  Her event      monitor previously was normal.  She is also not orthostatic.  I      have asked her to follow up with Dr. Drue Novel concerning this issue.  2. Hyperlipidemia - She will continue on her Pravachol and Zetia, and      Dr. Drue Novel is managing this.  3. Gastroesophageal reflux disease.  4. Hypertension - her blood pressure is well controlled on her present      medications.  5. History of nonobstructive coronary artery disease - she will      continue on her aspirin, Toprol      and statin, as well as her angiotensin converting enzyme inhibitor.      We will see her back in one year.     Madolyn Frieze Jens Som, MD, [facility]  Electronically Signed    BSC/MedQ  DD: 04/28/2007  DT: 04/28/2007  Job #: [id number]

## 2011-05-14 NOTE — Consult Note (Signed)
[name], SHADRICK NO.:  [id number]   MEDICAL RECORD NO.:  [id number]          PATIENT TYPE:  INP   LOCATION:  6533                         FACILITY:  [facility]   PHYSICIAN:  Pricilla Riffle, M.D.    DATE OF BIRTH:  07/31/1941   DATE OF CONSULTATION:  02/15/2005  DATE OF DISCHARGE:                                   CONSULTATION   IDENTIFICATION:  Ms. Barrette is a 70 year old woman who was admitted for  presyncope.   HISTORY OF PRESENT ILLNESS:  The patient was admitted recently to [facility] for chest pain, actually had only mild CAD by cath.   On talking to the patient and her daughter came in during the interview, she  has had these episodes of flushing and clammy sweatiness since January.  She  relates it actually to when Benicar and Lipitor were added to her regimen.  The episodes can occur at any time.  She can be walking up the stairs and  have them.  She could be driving.  She could be sitting.   The patient's daughter interjects that yesterday she was laying on the sofa,  and she began to feel flushed.  The patient is not clear what happened after  that.  But, the patient's daughter reports the father said she was not  responding too well, and he tried to get her up to go to the sofa.  She was  very weak and he had difficulty doing this.  There was no frank syncope.  She actually had her eyes open but was not responding well.   The patient was seen in the emergency room.  Initially, blood pressure was  okay, but as the evening progressed, she became mildly hypotensive with  systolics in the 90's, and she was therefore admitted.   On talking to the patient, she says she drinks adequate fluids, urinates  about four times per day.  She denies any chest pressure, although, the  records state she had some chest pressure.   ALLERGIES:  None.   ADMISSION MEDICATIONS:  1.  Aspirin 81 mg daily.  2.  Protonix 40 mg daily.  3.  Trazodone 100 mg daily.  4.  Lexapro 20 mg daily.  5.  Benicar 10 mg.  6.  Lipitor 20 mg.  7.  Lasix 20 mg q.o.d.   PAST MEDICAL HISTORY:  1.  Chest pain, recent cardiac catheterization showed 30% ostial left      circumflex, 30% mid LAD, LVF of 60% (January 27, 2005). The patient has      had two other cardiac catheterization in December 2002 and June 2001.      Had a Cardiolite scan done in January 2006 that was also normal.  2.  GE reflux.  3.  History of dyslipidemia.  No records on.  4.  History of hypertension.  5.  History of depression.   PAST SURGICAL HISTORY:  Status post hysterectomy at age 67.  Ovaries intact.   SOCIAL HISTORY:  The patient lives in [place].  She teaches.  She does  not smoke.  Drinks 3-4  mixed drinks per day.   FAMILY HISTORY:  Mother died at age 29 of diabetes.  Had an MI.  Father died  at age 38 of suicide.  One brother with CAD.   REVIEW OF SYSTEMS:  The patient has sweats as noted, occasional shortness of  breath.  Otherwise, all systems reviewed negative except above problems as  noted previously.  Note:  She denied every having hot flashes before.   PHYSICAL EXAMINATION:  GENERAL APPEARANCE:  The patient currently in no  acute distress.  VITAL SIGNS:  Temperature 98.3, respirations 18, blood pressure laying  140/88, pulse 72; sitting 160/88, pulse 68; standing 158/88, pulse 72.  NECK:  JVP is normal.  No bruits.  LUNGS:  Clear.  CARDIAC:  Regular rate and rhythm.  S1, S2.  No S3 or S4.  Grade 2/6  systolic murmur heard best at the base.  ABDOMEN:  Benign.  EXTREMITIES:  Good distal pulses throughout.  No edema.  NEUROLOGICAL:  Alert and oriented, cranial nerves II-XII intact.   STUDIES:  A 12-lead EKG shows normal sinus rhythm at a rate of 60 beats per  minute.   LABORATORY DATA:  Significant for hemoglobin of 12.2.  BUN and creatinine of  18 and 0.9, potassium 3.4, troponin x2 is less than 0.05.   IMPRESSION:  Ms. Condron is a 70 year old woman with a  confusing history.  She does not remember at all, so getting parts of it from family members.  Since January, she has had episodes of hot flashes, clamminess, dizziness,  no frank syncope that I can see.  They can occur at any time.  When she is  not having a spell, she feels fine.  Note:  Her medications were switched.  She had been on Lotrel and was switched to Benicar and Lescol.  She was  switched to Lipitor (it sounds because of achiness).  The achiness is gone,  but these other symptoms are now present.   In the emergency room, she was mildly hypotensive at times, and this has  improved.  She actually now is hypertensive and not orthostatic.   I would recommend continuing telemetry.  Would check orthostatics today and  tomorrow.  Would switch Benicar to Lisinopril 10.  Hold Lipitor for now.  Check urinalysis.  Check urine drug screen.  Check TSH.   It is not clear to me what is going on.  The patient's daughter does say she  drinks fairly heavily.  How this is contributing, it is unclear.      PVR/MEDQ  D:  02/15/2005  T:  02/15/2005  Job:  [id number]

## 2011-05-14 NOTE — Discharge Summary (Signed)
NAMEAUNDRAYA, DRIPPS            ACCOUNT NO.:  [id number]   MEDICAL RECORD NO.:  [id number]          PATIENT TYPE:  INP   LOCATION:  6533                         FACILITY:  [facility]   PHYSICIAN:  Rene Paci, M.D. LHCDATE OF BIRTH:  1941/02/11   DATE OF ADMISSION:  02/14/2005  DATE OF DISCHARGE:  02/16/2005                                 DISCHARGE SUMMARY   DISCHARGE DIAGNOSES:  1.  Syncope.  2.  Mild hypokalemia.   BRIEF ADMISSION HISTORY:  [name] is a 70 year old white female who  presents with recurrent presyncope and syncopal episodes.  The night prior  to admission was the most severe episode.  This prompted an emergency  department visit.  She does note feeling lightheaded when she goes from  lying to sitting or sitting to standing.  She states that things get black  and she feels hot, sweaty, and passes out and cannot remember the details.  There were no family members able to substantiate her symptoms.  She has no  prior history of seizure activity.   PAST MEDICAL HISTORY:  1.  Non-obstructive coronary artery disease.  Last catheterization was      January 27, 2005.  2.  Hypertension.  3.  Hyperlipidemia.  4.  Gastroesophageal reflux disease.  5.  Depression.  6.  Status post tonsillectomy and adenoidectomy.  7.  Status post appendectomy.  8.  History of a vein stripping procedure.   HOSPITAL COURSE:  #1 - RECURRENT PRESYNCOPE AND SYNCOPE:  The patient was  admitted for further evaluation.  Patient was admitted to telemetry.  She  did not have any arrhythmias.  Serial cardiac enzymes were also negative.  She was seen in consultation by cardiology.  They noted that her work-up was  unremarkable.  A 2-D echocardiogram has been ordered and has not yet been  read by Dr. Jens Som.  We anticipate if this is unremarkable the patient  will be discharged home.  It was recommended she follow up with Dr. Jens Som  in about two weeks.  At that point if she is having  persistent symptoms to  consider an event monitor and a 24-hour urine for VMA and possible tilt  table.  Patient has not been orthostatic during this admission.  Cardiology  did make a medication change.  They changed her Benicar to lisinopril.  The  patient is symptomatically improved.  She states that she has not felt this  well in several weeks.  The daughter did make note that she was concerned  about the patient's accelerated alcohol use and wondering if this was  contributing to her symptoms.  We did discuss this with the patient who  plans to abstain from alcohol over the next two-week period, at least.   #2 - HYPERTENSION:  As noted, the patient was on Benicar prior to admission.  This has been changed to lisinopril.  She has not been orthostatic.   #3 - MILD HYPOKALEMIA WITH A POTASSIUM 3.4:  We will replace prior to  discharge.   DISCHARGE LABORATORIES:  Potassium 3.4, BUN 22, creatinine 0.8.  TSH is  pending.  2-D echocardiogram is pending.  CT of the chest was negative for  PE and thoracic aneurysm.   DISCHARGE MEDICATIONS:  1.  Lisinopril 10 mg daily.  2.  Lipitor 40 mg daily.  3.  Protonix 40 mg daily.  4.  Aspirin 81 mg daily.  5.  Lexapro 20 mg daily.  6.  Lasix 20 mg every other day.   FOLLOW-UP:  With Dr. Jens Som Thursday, March 9 at 1:45 to consider an event  monitor versus a 24-hour urine for VMA versus tilt table.      LC/MEDQ  D:  02/16/2005  T:  02/16/2005  Job:  [id number]   cc:   Olga Millers, M.D. [facility]   Angelena Sole, M.D. [facility]

## 2011-05-14 NOTE — Op Note (Signed)
[name], [name]            ACCOUNT NO.:  [id number]   MEDICAL RECORD NO.:  [id number]          PATIENT TYPE:  AMB   LOCATION:  DSC                          FACILITY:  [facility]   PHYSICIAN:  Matthew A. 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   SEIZURE:  Excisional biopsy and proximal interphalangeal joint  debridement ulnar side.   SURGEON:  Artist Pais. Mina Marble, M.D.   ASSISTANT:  None.   ANESTHESIA:  General.   TOURNIQUET TIME:  21 minutes.   COMPLICATIONS:  None.   DRAINS:  None.   SPECIMEN SENT:  One.   OPERATIVE REPORT:  The patient taken to operating suite after induction  of general anesthesia the right extremity was prepped and draped usual  sterile fashion.  An Esmarch was used to exsanguinate the limb,  tourniquet was then inflated to 250 mmHg.  At this point time incision  made mid lateral over the proximal interphalangeal joint right index  finger, skin was incised.  The lateral band was longitudinally split,  flaps were raised accordingly.  The collateral ligament was carefully  identified and protected volarly.  A small arthrotomy was made anterior  to the collateral ligament.  The joint was entered.  There were several  small loose intra-articular bodies which were removed as well as for a  significant hypertrophic synovium was debrided using a synovectomy  rongeur.  The joint was thoroughly irrigated.  The PIP joint was stable  to stressing the collateral ligament with a very minimal part of the  anterior fibers taken down through the exposure.  Once this was done,  the interval, lateral band interval was closed with a inverted running 4-  0 Vicryl suture  followed by skin closure with 5-0 nylon.  Steri-Strips,  4x4s and compressive wrap was applied.  The patient tolerated procedure  well and went recovery room stable fashion.      Artist Pais Mina Marble, M.D.  Electronically Signed    MAW/MEDQ  D:  03/01/2007  T:  03/01/2007  Job:  [id number]

## 2011-05-14 NOTE — Cardiovascular Report (Signed)
[name], [name] NO.:  [id number]   MEDICAL RECORD NO.:  [id number]          PATIENT TYPE:  INP   LOCATION:  6732                         FACILITY:  [facility]   PHYSICIAN:  [name], M.D. [facility] DATE OF BIRTH:  04-10-1941   DATE OF PROCEDURE:  01/27/2005  DATE OF DISCHARGE:                              CARDIAC CATHETERIZATION   CLINICAL HISTORY:  [name] is 70 years old and has had a previous  history of chest pain.  She was evaluated with a Cardiolite about a month  ago which was negative and she also said she had a spiral CT which was  negative.  She was seen in Dr. Myrtie Alvarado office again today and had recurrent  chest pain and diaphoresis and was sent to the emergency room and seen by  Dr. Myrtis Alvarado and admitted for evaluation with angiography.   PROCEDURE:  The procedure was performed by the right femoral arterial using  an arterial vein sheath and 6 French coronary catheters.  Omnipaque contrast  was used.  The right femoral artery was closed with Starclose at the end of  the procedure.  The patient tolerated the procedure well and left the  laboratory in satisfactory condition.   RESULTS:  Left main coronary artery is free of disease.   Left anterior descending artery gave rise to a diagonal branch, a septal  perforator, and a second diagonal branch.  There were irregularities in the  LAD and there was 30% narrowing after the first septal perforator which had  not changed.  The diagonal branch looked better than described in the  previous study and appeared to be free of significant plaque.   Circumflex artery gave rise to a marginal branch and two posterolateral  branches.  There was 30% narrowing in the ostium of the circumflex artery  which had not changed.   The right coronary artery was a moderate size vessel that gave rise to two  right ventricular branches and posterior descending branch, and three  posterolateral branches.  There was  irregularity in the right coronary  artery but no significant obstruction.   The left ventriculogram from the RAO projection showed good wall motion with  no areas of hypokinesis.  The estimated ejection fraction 60%.   HEMODYNAMIC DATA:  Aortic pressure 146/80 with mean 106.  Left ventricular  pressure was 146/23.   CONCLUSION:  Minimal, nonobstructive coronary artery disease with 30%  narrowing in the proximal left anterior descending  artery, 30% ostial  narrowing in the circumflex artery, and irregular areas in the right  coronary artery with normal LV function.   RECOMMENDATIONS:  The patient has minimal nonobstructive disease and the  plaque actually looks better than it did on the previous catheterization  which was performed in 2002 by Dr. Riley Alvarado.  I think it is unlikely that her  symptoms are related to her heart.  We will consider keeping her tonight and  consider an outpatient GI evaluation.      BB/MEDQ  D:  01/27/2005  T:  01/27/2005  Job:  [id number]   cc:   [name], M.D. [facility]  [name], M.D. [facility]

## 2011-05-14 NOTE — Assessment & Plan Note (Signed)
[facility]                              CARDIOLOGY OFFICE NOTE   NAME:[name], [name]                   MRN:          [id number]  DATE:10/24/2006                            DOB:          1941/05/20    [name] returns for followup today. Please refer to my previous notes  for details. Since I last saw her, she denies any dyspnea or chest pain. She  has had several dizzy spells. She these are similar to what she described  on my previous notes. We did perform carotid Dopplers that were unremarkable  as well as an abdominal ultrasound that showed no aneurysm. She also had an  event monitor that showed no significant arrhythmia. She also has been  tracking her blood pressure. She states at times it has run as low as  systolic 97-98. It is not clear that her symptoms correlate with this and  overall her blood pressure is well-controlled.   MEDICATIONS:  1. Aspirin 81 mg p.o. daily.  2. Lexapro 20 mg p.o. daily.  3. Toprol 25 mg p.o. daily.  4. Protonix 40 mg p.o. daily.  5. Lisinopril HCT 10/12.5 mg p.o. daily.  6. Zetia 10 mg p.o. daily.  7. Crestor 40 mg p.o. daily.   PHYSICAL EXAMINATION:  VITAL SIGNS:  Blood pressure 102/50 and her pulse is  72.  NECK:  Supple.  CHEST:  Clear.  CARDIOVASCULAR:  Reveals a regular rate and rhythm.  EXTREMITIES:  Show no edema.   DIAGNOSES:  1. History of nonobstructed coronary disease.  2. Hyperlipidemia.  3. Gastroesophageal reflux disease.  4. Hypertension.  5. Dizzy spells of uncertain etiology.   PLAN:  [name] is continuing to have dizzy spells. Her event monitor  was unremarkable and her LV function has been preserved. Her carotid  Dopplers were also normal. It is not clear to me that her symptoms correlate  with low blood pressure. However, I have instructed her that if they  continue we can certainly try to decrease her medications and she will  contact us if this is indeed the case.  We will otherwise make no changes in  her medications and I will see her back in 6 months.    ______________________________  Madolyn Frieze. Jens Som, MD, [facility]   BSC/MedQ  DD: 10/24/2006  DT: 10/25/2006  Job #: [id number]

## 2011-05-14 NOTE — Discharge Summary (Signed)
[place]  Patient:    [name], [name] Visit Number: [id number] MRN: [id number]          Service Type: MED Location: [phone] Attending Physician:  Junious Silk Dictated by:   Rozell Searing, P.A. Admit Date:  12/22/2001 Discharge Date: 12/26/2001   CC:         Angelena Sole, M.D. [facility]   Referring Physician Discharge [place]  PROCEDURES:  Coronary angiogram 12/25/01.  REASON FOR ADMISSION:  The patient is a 70 year old female, with history of nonobstructive coronary artery disease and normal LV function and enrollment in the Reversal Study, who presented to the office for evaluation of chest discomfort.  Please refer to dictated admission note for full details.  LABORATORY DATA:  CBC normal.  INR 1.0.  Sodium 140, potassium 3.5, glucose 109, BUN 14, creatinine 0.8.  ADMISSION CHEST X-RAY:  No acute changes.  HOSPITAL COURSE:  Following admission, the patient had no further complaints of chest discomfort.  She was maintained on medical therapy, including Lovenox with plans to proceed with diagnostic coronary angiogram on Monday.  Cardiac catheterization, performed 12/25/01 by Dr. Riley Kill (see cath report for full details), revealed moderate coronary artery disease and normal LV function (EF 59%).  Specifically, the lesions were no greater than 50% stenotic.  Following review of films with Dr. Jens Som, the patients primary cardiologist, recommendation was to continue with medical therapy and proceed with an outpatient stress Cardiolite test.  DISCHARGE MEDICATIONS: 1. Coated aspirin 81 mg q.d. 2. Norvasc 10 mg q.d. 3. Protonix 40 mg q.d. 4. Lipitor 40 mg q.d. 5. Trazodone 100 mg q.d. 6. Celexa 40 mg q.d. 7. Naproxen 500 mg b.i.d. 8. Methocarbamol 500 mg b.i.d. 9. Nitrostat as directed.  INSTRUCTIONS:  No heavy lifting/driving x 2 days; maintain a low fat/cholesterol diet; call the office if there is any  bleeding/swelling in the groin.  The patient is scheduled for a stress Cardiolite on Monday, January 13, at 11:30 a.m. at [facility].  The patient is scheduled of follow up with Dr. Olga Millers on Thursday, January 18, 2002, at 2:45 p.m.  DISCHARGE DIAGNOSES: 1. Moderate coronary artery disease/normal left ventricle.    a. Coronary angiogram 12/25/01.    b. Reversal Trial.    c. Medical therapy recommended. 2. Dyslipidemia. 3. Probable gastroesophageal reflux disease. Dictated by:   Rozell Searing, P.A. Attending Physician:  Junious Silk DD:  12/26/01 TD:  12/26/01 Job: [id number]

## 2011-05-14 NOTE — Cardiovascular Report (Signed)
[place]  Patient:    ISELA, STANTZ Visit Number: [id number] MRN: [id number]          Service Type: MED Location: [phone] Attending Physician:  Junious Silk Dictated by:   Arturo Morton Riley Kill, M.D. [facility] Proc. Date: 12/25/01 Admit Date:  12/22/2001   CC:         Madolyn Frieze. Jens Som, M.D. [facility]  [facility]   Cardiac Catheterization  INDICATIONS:  Ms. Stouffer has had a prior cardiac catheterization.  She has had some fairly typical exertional angina; she has also gained some weight. The current study was done to assess coronary anatomy and was recommended by Dr. Madolyn Frieze. Crenshaw and Dr. Corinda Gubler.  PROCEDURE: 1. Left heart catheterization. 2. Selective coronary arteriography. 3. Selective left ventriculography.  DESCRIPTION OF PROCEDURE:  The procedure was performed from the right femoral artery using 6-French catheter.  She tolerated procedure without complication and was taken to the holding area in satisfactory clinical condition.  HEMODYNAMIC DATA: 1. Central aorta 152/80. 2. LV 152/16.  ANGIOGRAPHIC DATA: 1. Ventriculography was performed in the RAO projection.  Overall global    systolic function was preserved.  Ejection fraction was calculated at    58.6%.  No segmental wall motion abnormalities were identified. 2. The left main coronary artery was free of critical disease. 3. The LAD has about 40-50% narrowing just beyond the origin of the takeoff of    the intermediate; this represents probably an optional diagonal.  This is    best seen in the spider view and does not appear to be critical, although    it was somewhat difficult to evaluate because of overlap in multiple views.    The mid and distal portion of the LAD are without critical disease.  The    intermediate has about 30-40% mid-narrowing but without high-grade focal    narrowing. 4. The circumflex provides an AV circumflex with two  modest-sized marginal    branches.  There is about 40-50% ostial narrowing which is seen in multiple    views. 5. The right coronary demonstrates about 30% narrowing segmentally in the    proximal and mid-vessel.  There is a 50% ostial PDA stenosis in a fairly    sizable vessel.  The remainder of the right coronary artery is without    critical disease.  CONCLUSIONS: 1. Preserved overall left ventricular function. 2. Multiple intermediate lesions, none of which appear to be critical.  The    patient will likely need a stress Cardiolite to try to assess    exercise-induced chest pain, exercise duration as well as localized    myocardial ischemia.  In the absence of a focal defect that clearly    outlines a location, the leaning would be in the direction of medical    therapy. Dictated by:   Arturo Morton Riley Kill, M.D. [facility] Attending Physician:  Junious Silk DD:  12/25/01 TD:  12/25/01 Job: [phone] [id number]

## 2011-05-14 NOTE — Discharge Summary (Signed)
[name], [name] NO.:  [id number]   MEDICAL RECORD NO.:  [id number]          PATIENT TYPE:  INP   LOCATION:  6533                         FACILITY:  [facility]   PHYSICIAN:  Cornell Barman, P.A. LHCDATE OF BIRTH:  [birth year]   DATE OF ADMISSION:  02/14/2005  DATE OF DISCHARGE:  02/16/2005                                 DISCHARGE SUMMARY   No dictation for this job number.      LC/MEDQ  D:  04/16/2005  T:  04/16/2005  Job:  [id number]

## 2011-05-14 NOTE — Cardiovascular Report (Signed)
[place]. [facility]  Patient:    [name], [name]                   MRN: [id number] Proc. Date: 06/16/00 Adm. Date:  [id number] Disc. Date: [id number] Attending:  Lenoria Farrier CC:         Justine Null, M.D. [facility]             Madolyn Frieze. Jens Som, M.D. [facility]             Cardiac Catheterization Lab                        Cardiac Catheterization  INDICATIONS:  Mrs. Klingel is 70 years old and has nonobstructive coronary disease documented at catheterization one-and-a-half years ago.  She has hypertension and a positive family history of premature coronary disease and hyperlipidemia.  She was brought back today for a followup catheterization as part of the Reversal trial.  DESCRIPTION OF PROCEDURE:  The procedure was performed via the right femoral artery using an arterial sheath and 6-French preformed coronary catheters.  A front wall arterial puncture was performed, and Omnipaque contrast was used. We used a 7-French JL3.5 guiding catheter with sideholes and a short Floppy wire to IVUS the left anterior descending artery.  We passed the IVUS down the LAD without much difficulty.  We used a 3.2 UltraCross, and passed the UltraCross down past the mid portion of the LAD and did an automatic pullback. Nitroglycerin was given before passage of the IVUS catheter.  Repeat diagnostic studies were then performed through the guiding catheter.  The patient tolerated the procedure well and left the laboratory in satisfactory condition.  RESULTS: 1. The left main coronary artery is free of significant disease. 2. The left anterior descending artery gave rise to an early diagonal branch,    two large septal perforators, several small septal perforators, and a small    diagonal branch.  There was 30% narrowing in the mid portion of the vessel. 3. The right coronary artery was a moderate sized vessel that gave rise to a    conus branch, three right ventricular  branches, a posterior descending    branch, and a posterolateral branch.  There was 30% narrowing in the mid    right coronary artery with some irregularities.  The left ventriculogram performed in the RAO projection showed good wall motion with no areas of hypokinesis.  The estimated ejection fraction was 60%.  The IVUS run of the LAD showed a 3.0 vessel with moderate plaque.  The maximum area of plaque area was about 20% of the lumen area.  CONCLUSION:  Nonobstructive coronary artery disease with 30% narrowing in the mid LAD, 30% narrowing in the mid right coronary artery, no significant disease in the circumflex artery, and normal LV function.  RECOMMENDATIONS:  Reassurance.  ADDENDUM:  The aortic pressure was 123/60 with a mean of 83.  The left ventricular pressure was 123/6.  Based on the description last time, the LAD appears about the same.  There is minor irregularities in the right coronary artery which were not described on the last catheterization, but I did not review those pictures to make a direct comparison. DD:  06/17/00 TD:  06/20/00 Job: [id number]

## 2011-05-14 NOTE — Assessment & Plan Note (Signed)
[facility]                              CARDIOLOGY OFFICE NOTE   NAME:[name], [name]                   MRN:          [id number]  DATE:09/14/2006                            DOB:          [birth year]    ADDENDUM   This is on a note I dictated earlier today.  Please add at the bottom  addendum.   We did check orthostatics.  In a lying position, her blood pressure was  93/69 with a pulse of 56.  In the standing position, she was 116/68 with a  pulse of 63.  She is, therefore, not orthostatic.                              Madolyn Frieze Jens Som, MD, [facility]    BSC/MedQ  DD:  09/14/2006  DT:  09/15/2006  Job #:  [id number]

## 2011-05-14 NOTE — Consult Note (Signed)
NAMEMERIAH, SHANDS NO.:  [id number]   MEDICAL RECORD NO.:  [id number]          PATIENT TYPE:  INP   LOCATION:  1828                         FACILITY:  [facility]   PHYSICIAN:  Willa Rough, M.D.     DATE OF BIRTH:  Mar 04, 1941   DATE OF CONSULTATION:  01/27/2005  DATE OF DISCHARGE:                                   CONSULTATION   PRIMARY CARE PHYSICIAN:  Angelena Sole, M.D. and Olga Millers, M.D.   CHIEF COMPLAINT:  Chest pain.   REASON FOR ADMISSION:  The patient states she is admitted for a cardiac  catheterization.   HISTORY OF PRESENT ILLNESS:  This is a very pleasant, 70 year old female  followed by Dr. Jens Som and seen by Dr. Ruthine Dose this morning.  The patient  states prior to her follow-up appointment with Dr. Ruthine Dose, the patient was  descending steps when she had a sudden onset of pressure mid sternal  radiating down her left arm.  She states the pain was a 10 on a scale of 1-  10.  She states the pain eventually went away with rest.  It lasted about 3-  4 minutes.  She was positive for diaphoresis and shortness of breath with  episode.  She denies nausea or vomiting or presyncopal symptoms.  The  patient also complains of shortness of breath with ADLs over the last 3-4  months.  She had discussed this symptom with Dr. Jens Som and had a stress  Myoview done at our [facility] on January 07, 2005.  Interpretation of stress Myoview with chest pain with no diagnostic  electrocardiographic changes showed probable apical thinning without  significant ischemia.  Gaited ejection fraction was 70% at that time and  wall motion was normal.  Currently, the patient is pain free.   PAST MEDICAL HISTORY:  1.  Last cardiac catheterization was in December 2002.  2.  Hypertension.  3.  Hyperlipidemia.  4.  GERD.  5.  Depression.  6.  Status post tonsillectomy and adenoidectomy.  7.  Appendectomy.  8.  Vein stripping.   ALLERGIES:  No known drug  allergies.   MEDICATIONS:  1.  Protonix 40.  2.  Aspirin 81.  3.  Lasix 20 mg every other day.  4.  Lexapro 20 mg daily.  5.  Lotrel was recently discontinued.  6.  Calcium/Vitamin D b.i.d.  7.  Lescol recently discontinued.  8.  Lipitor 40 mg daily.  9.  Benicar 20 mg daily.  10. Fish oil 3000 mg daily.   SOCIAL HISTORY:  The patient lives here in [place] with her husband.  She  is disabled.  She has three adult children alive and well, one with  diabetes.  She denies any tobacco use in several years.  Basically, no  exercises.  She watches her granddaughter.  Denies any ETOH, drug or herbal  medication use.  She pretty much eats whatever she wants to.   FAMILY HISTORY:  Positive for coronary artery disease.  Mother deceased at  age 21 with diabetes and MI.  Father deceased at age 29 with history of  heart disease and suicide.  She has a brother who is alive with coronary  artery disease and diabetes and a sister alive with diabetes.   REVIEW OF SYSTEMS:  CONSTITUTIONAL:  Positive for sweats.  CARDIOPULMONARY:  Positive for chest pain, shortness of breath, dyspnea on exertion, positive  for peripheral lower extremity edema, positive for cough with clear mucus.  GENITOURINARY:  Positive for nocturia two to three times a night.  GASTROINTESTINAL:  Positive for GERD symptoms without Protonix use.   PHYSICAL EXAMINATION:  VITAL SIGNS:  Temperature 98.3, pulse 60 and regular,  respirations 18, blood pressure 151/72.  She is currently saturating 96% on  room air.  GENERAL:  The patient is in no acute distress currently.  HEENT:  Normocephalic, atraumatic.  Pupils equal round and reactive to  light.  NECK:  Supple with no jugular venous distention.  No carotid bruits.  No  thyromegaly.  No lymphadenopathy.  HEART:  Regular rate and rhythm, S1, S2, no murmurs, rubs or gallops.  LUNGS:  Clear to auscultation bilaterally.  ABDOMEN:  Soft, nontender.  Positive bowel sounds x4  quadrants.  Nondistended.  EXTREMITIES:  No clubbing, cyanosis or edema noted.  Pulses are equal  bilaterally.  NEUROLOGIC:  The patient is alert and oriented x3.  Cranial nerves 2-12  grossly intact.  Muscle strength 5/5 in all four extremities.   LABORATORY DATA AND X-RAY FINDINGS:  EKG showing a rate of 59, rhythm sinus  bradycardic with PR 158, QRS 92, QTC 434.  Chest x-ray pending.   Lab work is pending currently.   ASSESSMENT:  Dr. Myrtis Ser in to see the patient and exam patient.  Agree with  plan.  The patient is being admitted for chest pain continued in light of  recent Cardiolite.  At this point, we feel the patient needs further workup  with cardiac catheterization planned for a.m.  However, after speaking with  the patient and Dr. Myrtis Ser, catheterization lab is available to go ahead and  do cardiac catheterization today.  I am in the process of working the  patient up for baseline blood work and prepping for cardiac catheterization.      MB/MEDQ  D:  01/27/2005  T:  01/27/2005  Job:  [id number]   cc:   Olga Millers, M.D. [facility]

## 2011-05-14 NOTE — Discharge Summary (Signed)
[name], [name] NO.:  [id number]   MEDICAL RECORD NO.:  [id number]          PATIENT TYPE:  INP   LOCATION:  6732                         FACILITY:  [facility]   PHYSICIAN:  Rene Paci, M.D. LHCDATE OF BIRTH:  March 09, 1941   DATE OF ADMISSION:  01/27/2005  DATE OF DISCHARGE:  01/28/2005                                 DISCHARGE SUMMARY   DIAGNOSIS AT DISCHARGE:  Chest pain.   BRIEF ADMISSION HISTORY:  Ms. Emmerich is a 69 year old white female who saw  Dr. Ruthine Dose on the day of admission.  While there, she describes a sudden  onset of chest pressure.  This occurred while walking down the steps.  Pain  eventually went away with rest.  She did have diaphoresis and shortness of  breath.   PAST MEDICAL HISTORY:  1.  Hypertension.  2.  Hyperlipidemia.  3.  Gastroesophageal reflux disease.  4.  Status post tonsillectomy.  5.  Status post adenoidectomy.  6.  Status post appendectomy.  7.  Status post vein stripping.  8.  Negative stress Cardiolite in January 2006.   HOSPITAL COURSE:  Cardiovascular.  The patient presented with chest pain.  The patient had had previous Cardiolites that were unremarkable.  It was  felt that the patient should proceed with a cardiac catheterization.  Cardiac catheterization revealed nonobstructive coronary disease.  The  patient's pain was not felt to be cardiac, and she was felt to be stable for  discharge.   LABORATORIES AT DISCHARGE:  CBC was normal.  Coags were normal.  CMET was  normal.  Cholesterol 162, triglycerides 151, HDL 51, LDL 79.   MEDICATIONS AT DISCHARGE:  1.  Protonix 40 mg daily.  2.  Aspirin 81 mg daily.  3.  Lasix 20 mg every other day.  4.  Lexapro 20 mg daily.  5.  Lipitor 40 mg daily.  6.  Benicar 20 mg daily.  7.  Fish oil daily.   FOLLOWUP:  With Dr. Ruthine Dose.      LC/MEDQ  D:  04/22/2005  T:  04/22/2005  Job:  [id number]   cc:   Angelena Sole, M.D. [facility]   Olga Millers, M.D. [facility]

## 2011-05-14 NOTE — Assessment & Plan Note (Signed)
[facility]                              CARDIOLOGY OFFICE NOTE   NAME:[name], [name]                   MRN:          [id number]  DATE:09/14/2006                            DOB:          07/18/1941    HISTORY OF PRESENT ILLNESS:  Mr. Bosques is a pleasant female who has a  history of chest pain.  She has had catheterizations that have been  unremarkable in the past.  The most recent one was in 2006 and showed non-  obstructive disease and normal LV function.  She had an echocardiogram in  January, 2006 that showed normal LV function and trace mitral and tricuspid  regurgitation.  Since I last saw her, she denies any increased dyspnea,  although she does have some dyspnea with more extreme exertion.  There is  occasional mild pedal edema.  There is no orthopnea or PND.  She does not  have exertional chest pain; however, she is complaining of episodes of  dizziness.  These occur approximately 2-3 times per week and have been  longstanding for years.  There is occasional palpitations but this is not  with every episode.  There is no chest pain or shortness of breath.  She  does feel some diaphoresis and mild nausea.  It lasts for 10 minutes and  resolves spontaneously.  There are no other neurological symptoms noted.   MEDICATIONS:  1. Aspirin 81 mg p.o. daily.  2. Lexapro 20 mg p.o. daily.  3. Toprol 25 mg p.o. daily.  4. Protonix 40 mg p.o. daily.  5. Lisinopril/HCT 10/12.5 mg p.o. daily.  6. Zetia 10 mg p.o. daily.  7. 40 mg p.o. daily.   PHYSICAL EXAMINATION:  VITAL SIGNS:  Blood pressure:  120/78.  Pulse:  80.  Weight:  220 lb.  NECK:  Supple.  There is a question of a left bruit.  CHEST:  Clear to auscultation with normal expansion.  CARDIOVASCULAR:  Regular rate and rhythm.  No murmurs, rubs or gallops.  ABDOMEN:  No pulsatile masses, no bruits but there is tenderness to deep  palpation.  EXTREMITIES:  No edema.   LABS:   Electrocardiogram from Dr. Drue Novel' office on August 22, 2006 showed a  normal sinus rhythm.  Her rate is 62.  The axis is normal.  There are no ST  changes.  The QT was normal.   DIAGNOSES:  1. Dizzy spells of uncertain etiology.  2. History of non-obstructive coronary disease.  3. History of hyperlipidemia.  4. Gastroesophageal reflux disease.   PLAN:  Ms. Berish is complaining of dizzy spells of uncertain etiology.  We will schedule her to have an event monitor to exclude significant  arrhythmia.  We will also check orthostatics today, although her symptoms do  not sound to be consistent with orthostasis.  She has a questionable left  carotid bruit.  We will schedule her for carotid Dopplers and also abdominal  ultrasound to exclude aneurysm given the pain on deep palpation.  I will see  her back in 6 weeks to review the above.   ADDENDUM:  We did check  orthostatics.  In a lying position, her blood  pressure was 93/69 with a pulse of 56.  In the standing position, she was  116/68 with a pulse of 63.  She is, therefore, not orthostatic.                              Madolyn Frieze Jens Som, MD, [facility]   OZH#[id number]  BSC/MedQ  DD:  09/14/2006  DT:  09/15/2006  Job #:  [id number]   cc:   Willow Ora, MD

## 2011-05-19 ENCOUNTER — Encounter: Payer: Self-pay | Admitting: Cardiology

## 2011-05-19 ENCOUNTER — Ambulatory Visit (INDEPENDENT_AMBULATORY_CARE_PROVIDER_SITE_OTHER): Payer: Medicare Other | Admitting: Cardiology

## 2011-05-19 DIAGNOSIS — I739 Peripheral vascular disease, unspecified: Secondary | ICD-10-CM | POA: Insufficient documentation

## 2011-05-19 DIAGNOSIS — R072 Precordial pain: Secondary | ICD-10-CM

## 2011-05-19 DIAGNOSIS — Z9189 Other specified personal risk factors, not elsewhere classified: Secondary | ICD-10-CM

## 2011-05-19 DIAGNOSIS — I2510 Atherosclerotic heart disease of native coronary artery without angina pectoris: Secondary | ICD-10-CM

## 2011-05-19 DIAGNOSIS — E785 Hyperlipidemia, unspecified: Secondary | ICD-10-CM

## 2011-05-19 DIAGNOSIS — I10 Essential (primary) hypertension: Secondary | ICD-10-CM

## 2011-05-19 NOTE — Assessment & Plan Note (Signed)
Patient describes atypical chest pain. Schedule stress echocardiogram for risk stratification.

## 2011-05-19 NOTE — Assessment & Plan Note (Signed)
Managed by primary care. 

## 2011-05-19 NOTE — Assessment & Plan Note (Signed)
Blood pressure controlled. Continue present medications. 

## 2011-05-19 NOTE — Assessment & Plan Note (Signed)
Continue aspirin and statin. 

## 2011-05-19 NOTE — Patient Instructions (Signed)
Your physician has requested that you have a stress echocardiogram. For further information please visit [email or web address] Please follow instruction sheet as given.  Your physician has requested that you have a lower extremity arterial duplex. This test is an ultrasound of the arteries in the legs. It looks at arterial blood flow in the legs. Allow one hour for Lower Arterial scans. There are no restrictions or special instructions  Your physician wants you to follow-up in: 1 year. You will receive a reminder letter in the mail two months in advance. If you don't receive a letter, please call our office to schedule the follow-up appointment.

## 2011-05-19 NOTE — Progress Notes (Signed)
HPI: [name] is a pleasant female who has a history of mild coronary artery disease by previous catheterization in February 2006.  At that time, she had a 30% LAD, 30% circumflex and irregularities in the right coronary artery.  Her ejection fraction was 60%.  An echocardiogram in January 2006, showed normal LV function and trace mitral and tricuspid regurgitation.  She has also had a previous abdominal ultrasound that showed no aneurysm in October 2007.  Carotid Dopplers in October 2007, showed 0-39% bilateral internal carotid artery stenosis. I last saw her in Nov 2011. Since then she describes occasional chest pain. It is substernal and with occasional radiation to her upper extremities. There are no associated symptoms. It lasts seconds and resolve spontaneously.They occur with stress. No exertional chest pain. She also notices discoloration of her feet at times. She states they will be purple. There is some pain from the mid tibia down with ambulation.  Current Outpatient Prescriptions  Medication Sig Dispense Refill  . aspirin 81 MG tablet Take 81 mg by mouth daily.        . citalopram (CELEXA) 20 MG tablet Take 20 mg by mouth daily.        . furosemide (LASIX) 20 MG tablet Take 20 mg by mouth daily.        Marland Kitchen omeprazole (PRILOSEC) 20 MG capsule Take 20 mg by mouth daily.        . pravastatin (PRAVACHOL) 80 MG tablet Take 80 mg by mouth daily.        Marland Kitchen triamcinolone (KENALOG) 0.1 % cream Apply topically as needed.        . zolpidem (AMBIEN) 10 MG tablet Take 10 mg by mouth at bedtime as needed.           Past Medical History  Diagnosis Date  . GERD (gastroesophageal reflux disease)   . HTN (hypertension)   . Depression   . CAD (coronary artery disease)     nonobstructive  . HLD (hyperlipidemia)     Past Surgical History  Procedure Date  . Vein ligation and stripping   . Total abdominal hysterectomy w/ bilateral salpingoophorectomy   . Tonsillectomy and adenoidectomy      History   Social History  . Marital Status: Married    Spouse Name: N/A    Number of Children: N/A  . Years of Education: N/A   Occupational History  . Not on file.   Social History Main Topics  . Smoking status: Never Smoker   . Smokeless tobacco: Not on file  . Alcohol Use: No  . Drug Use: Not on file  . Sexually Active: Not on file   Other Topics Concern  . Not on file   Social History Narrative  . No narrative on file    ROS: no fevers or chills, productive cough, hemoptysis, dysphasia, odynophagia, melena, hematochezia, dysuria, hematuria, rash, seizure activity, orthopnea, PND, pedal edema. Remaining systems are negative.  Physical Exam: Well-developed well-nourished in no acute distress.  Skin is warm and dry.  HEENT is normal.  Neck is supple. No thyromegaly.  Chest is clear to auscultation with normal expansion.  Cardiovascular exam is regular rate and rhythm.  Abdominal exam nontender or distended. No masses palpated. Extremities show no edema. 2+femoral pulses; 2 + left dp and 1+ right neuro grossly intact  ECG Sinus rhythm at a rate of 68. Rare PAC.

## 2011-05-19 NOTE — Assessment & Plan Note (Signed)
Symptoms atypical for claudication. Schedule ABIs.

## 2011-06-08 ENCOUNTER — Encounter (INDEPENDENT_AMBULATORY_CARE_PROVIDER_SITE_OTHER): Payer: Medicare Other | Admitting: *Deleted

## 2011-06-08 ENCOUNTER — Ambulatory Visit (HOSPITAL_COMMUNITY): Payer: Medicare Other | Attending: Cardiology | Admitting: Radiology

## 2011-06-08 DIAGNOSIS — I739 Peripheral vascular disease, unspecified: Secondary | ICD-10-CM

## 2011-06-08 DIAGNOSIS — R5381 Other malaise: Secondary | ICD-10-CM | POA: Insufficient documentation

## 2011-06-08 DIAGNOSIS — R079 Chest pain, unspecified: Secondary | ICD-10-CM | POA: Insufficient documentation

## 2011-06-08 DIAGNOSIS — R072 Precordial pain: Secondary | ICD-10-CM

## 2011-06-08 DIAGNOSIS — R55 Syncope and collapse: Secondary | ICD-10-CM | POA: Insufficient documentation

## 2011-06-08 DIAGNOSIS — R0989 Other specified symptoms and signs involving the circulatory and respiratory systems: Secondary | ICD-10-CM

## 2011-06-08 DIAGNOSIS — E785 Hyperlipidemia, unspecified: Secondary | ICD-10-CM | POA: Insufficient documentation

## 2011-06-08 DIAGNOSIS — R42 Dizziness and giddiness: Secondary | ICD-10-CM | POA: Insufficient documentation

## 2011-06-08 DIAGNOSIS — E669 Obesity, unspecified: Secondary | ICD-10-CM | POA: Insufficient documentation

## 2011-06-10 ENCOUNTER — Encounter: Payer: Self-pay | Admitting: Cardiology

## 2011-06-11 ENCOUNTER — Telehealth: Payer: Self-pay | Admitting: Cardiology

## 2011-06-11 NOTE — Telephone Encounter (Signed)
Test results

## 2011-06-14 NOTE — Telephone Encounter (Signed)
Spoke with pt, aware of stress test and lea results [name]

## 2011-11-08 ENCOUNTER — Other Ambulatory Visit: Payer: Self-pay | Admitting: Internal Medicine

## 2011-11-08 DIAGNOSIS — Z1231 Encounter for screening mammogram for malignant neoplasm of breast: Secondary | ICD-10-CM

## 2011-12-01 ENCOUNTER — Ambulatory Visit: Payer: Medicare Other

## 2011-12-08 ENCOUNTER — Ambulatory Visit: Payer: Medicare Other

## 2011-12-15 ENCOUNTER — Ambulatory Visit: Payer: Medicare Other

## 2012-01-06 ENCOUNTER — Ambulatory Visit
Admission: RE | Admit: 2012-01-06 | Discharge: 2012-01-06 | Disposition: A | Discharge status: Home / Self Care | Payer: Medicare Other | Source: Ambulatory Visit | Attending: Internal Medicine | Admitting: Internal Medicine

## 2012-01-06 DIAGNOSIS — Z1231 Encounter for screening mammogram for malignant neoplasm of breast: Secondary | ICD-10-CM

## 2012-08-01 ENCOUNTER — Encounter: Payer: Self-pay | Admitting: Cardiology

## 2012-08-01 ENCOUNTER — Ambulatory Visit (INDEPENDENT_AMBULATORY_CARE_PROVIDER_SITE_OTHER): Payer: Medicare Other | Admitting: Cardiology

## 2012-08-01 VITALS — BP 145/79 | HR 62 | Ht 65.0 in | Wt 202.8 lb

## 2012-08-01 DIAGNOSIS — Z9189 Other specified personal risk factors, not elsewhere classified: Secondary | ICD-10-CM

## 2012-08-01 DIAGNOSIS — E785 Hyperlipidemia, unspecified: Secondary | ICD-10-CM

## 2012-08-01 DIAGNOSIS — I10 Essential (primary) hypertension: Secondary | ICD-10-CM

## 2012-08-01 DIAGNOSIS — I2510 Atherosclerotic heart disease of native coronary artery without angina pectoris: Secondary | ICD-10-CM

## 2012-08-01 NOTE — Assessment & Plan Note (Signed)
EKG is normal. Her recent functional study negative. Symptoms are chronic and unchanged. No further cardiac evaluation at this time.

## 2012-08-01 NOTE — Assessment & Plan Note (Signed)
Blood pressure controlled. Continue present medications. Potassium and renal function monitored by primary care. 

## 2012-08-01 NOTE — Assessment & Plan Note (Signed)
Mild on previous catheterization. Continue aspirin and statin. 

## 2012-08-01 NOTE — Patient Instructions (Addendum)
Your physician wants you to follow-up in: ONE YEAR WITH DR CRENSHAW You will receive a reminder letter in the mail two months in advance. If you don't receive a letter, please call our office to schedule the follow-up appointment.  

## 2012-08-01 NOTE — Progress Notes (Signed)
   HPI: [name] is a pleasant female who has a history of mild coronary artery disease by previous catheterization in February 2006. At that time, she had a 30% LAD, 30% circumflex and irregularities in the right coronary artery. Her ejection fraction was 60%. An echocardiogram in January 2006, showed normal LV function and trace mitral and tricuspid regurgitation. She has also had a previous abdominal ultrasound that showed no aneurysm in October 2007. Carotid Dopplers in October 2007, showed 0-39% bilateral internal carotid artery stenosis. Stress echocardiogram in June of 2012 was normal. ABIs in June of 2012 were normal. Since I last saw her in May of 2012, she has dyspnea with more extreme activities but no orthopnea, PND, pedal edema or syncope. She continues to have an occasional pain in her chest with activities for one to 2 seconds. She has had this for years and is unchanged.   Current Outpatient Prescriptions  Medication Sig Dispense Refill  . aspirin 81 MG tablet Take 81 mg by mouth daily.        . citalopram (CELEXA) 20 MG tablet Take 20 mg by mouth daily.        . furosemide (LASIX) 20 MG tablet Take 20 mg by mouth daily.        . nebivolol (BYSTOLIC) 5 MG tablet Take 5 mg by mouth daily.      Marland Kitchen omeprazole (PRILOSEC) 20 MG capsule Take 20 mg by mouth daily.        . pravastatin (PRAVACHOL) 80 MG tablet Take 80 mg by mouth daily.        Marland Kitchen triamcinolone (KENALOG) 0.1 % cream Apply topically as needed.           Past Medical History  Diagnosis Date  . GERD (gastroesophageal reflux disease)   . HTN (hypertension)   . Depression   . CAD (coronary artery disease)     nonobstructive  . HLD (hyperlipidemia)     Past Surgical History  Procedure Date  . Vein ligation and stripping   . Total abdominal hysterectomy w/ bilateral salpingoophorectomy   . Tonsillectomy and adenoidectomy     History   Social History  . Marital Status: Married    Spouse Name: N/A    Number of  Children: N/A  . Years of Education: N/A   Occupational History  . Not on file.   Social History Main Topics  . Smoking status: Never Smoker   . Smokeless tobacco: Never Used  . Alcohol Use: No  . Drug Use: Not on file  . Sexually Active: Not on file   Other Topics Concern  . Not on file   Social History Narrative  . No narrative on file    ROS: no fevers or chills, productive cough, hemoptysis, dysphasia, odynophagia, melena, hematochezia, dysuria, hematuria, rash, seizure activity, orthopnea, PND, pedal edema, claudication. Remaining systems are negative.  Physical Exam: Well-developed obese in no acute distress.  Skin is warm and dry.  HEENT is normal.  Neck is supple.  Chest is clear to auscultation with normal expansion.  Cardiovascular exam is regular rate and rhythm.  Abdominal exam nontender or distended. No masses palpated. Extremities show no edema. neuro grossly intact  ECG sinus rhythm at a rate of 60. No ST changes.

## 2012-08-01 NOTE — Assessment & Plan Note (Signed)
Continue statin. Lipids and liver monitored by primary care. 

## 2012-08-22 ENCOUNTER — Ambulatory Visit: Payer: Medicare Other | Admitting: Cardiology

## 2012-08-29 ENCOUNTER — Observation Stay (HOSPITAL_COMMUNITY)
Admission: EM | Admit: 2012-08-29 | Discharge: 2012-08-30 | Disposition: A | Discharge status: Home / Self Care | Payer: Medicare Other | Attending: Internal Medicine | Admitting: Internal Medicine

## 2012-08-29 ENCOUNTER — Encounter (HOSPITAL_COMMUNITY): Payer: Self-pay | Admitting: Neurology

## 2012-08-29 ENCOUNTER — Emergency Department (HOSPITAL_COMMUNITY): Payer: Medicare Other

## 2012-08-29 DIAGNOSIS — R55 Syncope and collapse: Principal | ICD-10-CM

## 2012-08-29 DIAGNOSIS — F329 Major depressive disorder, single episode, unspecified: Secondary | ICD-10-CM

## 2012-08-29 DIAGNOSIS — E785 Hyperlipidemia, unspecified: Secondary | ICD-10-CM

## 2012-08-29 DIAGNOSIS — K219 Gastro-esophageal reflux disease without esophagitis: Secondary | ICD-10-CM

## 2012-08-29 DIAGNOSIS — I259 Chronic ischemic heart disease, unspecified: Secondary | ICD-10-CM

## 2012-08-29 DIAGNOSIS — R7301 Impaired fasting glucose: Secondary | ICD-10-CM

## 2012-08-29 DIAGNOSIS — J189 Pneumonia, unspecified organism: Secondary | ICD-10-CM

## 2012-08-29 DIAGNOSIS — F419 Anxiety disorder, unspecified: Secondary | ICD-10-CM

## 2012-08-29 DIAGNOSIS — Z8669 Personal history of other diseases of the nervous system and sense organs: Secondary | ICD-10-CM

## 2012-08-29 DIAGNOSIS — Z9089 Acquired absence of other organs: Secondary | ICD-10-CM

## 2012-08-29 DIAGNOSIS — F325 Major depressive disorder, single episode, in full remission: Secondary | ICD-10-CM | POA: Diagnosis present

## 2012-08-29 DIAGNOSIS — I2510 Atherosclerotic heart disease of native coronary artery without angina pectoris: Secondary | ICD-10-CM

## 2012-08-29 DIAGNOSIS — Z8719 Personal history of other diseases of the digestive system: Secondary | ICD-10-CM

## 2012-08-29 DIAGNOSIS — Z9189 Other specified personal risk factors, not elsewhere classified: Secondary | ICD-10-CM

## 2012-08-29 DIAGNOSIS — R42 Dizziness and giddiness: Secondary | ICD-10-CM

## 2012-08-29 DIAGNOSIS — E7800 Pure hypercholesterolemia, unspecified: Secondary | ICD-10-CM | POA: Insufficient documentation

## 2012-08-29 DIAGNOSIS — I10 Essential (primary) hypertension: Secondary | ICD-10-CM

## 2012-08-29 DIAGNOSIS — Z9079 Acquired absence of other genital organ(s): Secondary | ICD-10-CM

## 2012-08-29 DIAGNOSIS — M1990 Unspecified osteoarthritis, unspecified site: Secondary | ICD-10-CM

## 2012-08-29 DIAGNOSIS — I209 Angina pectoris, unspecified: Secondary | ICD-10-CM

## 2012-08-29 DIAGNOSIS — R0609 Other forms of dyspnea: Secondary | ICD-10-CM

## 2012-08-29 DIAGNOSIS — F3289 Other specified depressive episodes: Secondary | ICD-10-CM

## 2012-08-29 DIAGNOSIS — I739 Peripheral vascular disease, unspecified: Secondary | ICD-10-CM

## 2012-08-29 DIAGNOSIS — R0600 Dyspnea, unspecified: Secondary | ICD-10-CM

## 2012-08-29 DIAGNOSIS — R609 Edema, unspecified: Secondary | ICD-10-CM

## 2012-08-29 HISTORY — DX: Unspecified osteoarthritis, unspecified site: M19.90

## 2012-08-29 HISTORY — DX: Anxiety disorder, unspecified: F41.9

## 2012-08-29 HISTORY — DX: Syncope and collapse: R55

## 2012-08-29 HISTORY — DX: Other forms of dyspnea: R06.09

## 2012-08-29 HISTORY — DX: Angina pectoris, unspecified: I20.9

## 2012-08-29 HISTORY — DX: Dyspnea, unspecified: R06.00

## 2012-08-29 LAB — CBC WITH DIFFERENTIAL/PLATELET
Basophils Relative: 0 % (ref 0–1)
Eosinophils Relative: 2 % (ref 0–5)
HCT: 37.7 % (ref 36.0–46.0)
Hemoglobin: 13.1 g/dL (ref 12.0–15.0)
Lymphocytes Relative: 31 % (ref 12–46)
MCHC: 34.7 g/dL (ref 30.0–36.0)
MCV: 84.7 fL (ref 78.0–100.0)
Monocytes Absolute: 0.3 10*3/uL (ref 0.1–1.0)
Monocytes Relative: 6 % (ref 3–12)
Neutro Abs: 3.5 10*3/uL (ref 1.7–7.7)

## 2012-08-29 LAB — URINE MICROSCOPIC-ADD ON

## 2012-08-29 LAB — URINALYSIS, ROUTINE W REFLEX MICROSCOPIC
Glucose, UA: NEGATIVE mg/dL
Hgb urine dipstick: NEGATIVE
Specific Gravity, Urine: 1.011 (ref 1.005–1.030)

## 2012-08-29 LAB — BASIC METABOLIC PANEL
BUN: 18 mg/dL (ref 6–23)
CO2: 27 mEq/L (ref 19–32)
Chloride: 100 mEq/L (ref 96–112)
GFR calc Af Amer: 80 mL/min — ABNORMAL LOW (ref >90)
Potassium: 3.9 mEq/L (ref 3.5–5.1)

## 2012-08-29 LAB — TROPONIN I: Troponin I: <0.3 ng/mL

## 2012-08-29 LAB — PHOSPHORUS: Phosphorus: 3.8 mg/dL (ref 2.3–4.6)

## 2012-08-29 LAB — MAGNESIUM: Magnesium: 1.8 mg/dL (ref 1.5–2.5)

## 2012-08-29 MED ORDER — ASPIRIN 81 MG PO TABS: 81.0000 mg | ORAL_TABLET | Freq: Every day | ORAL | Status: DC

## 2012-08-29 MED ORDER — SODIUM CHLORIDE 0.9 % IV SOLN: Freq: Once | INTRAVENOUS | Status: DC

## 2012-08-29 MED ORDER — ACETAMINOPHEN 650 MG RE SUPP: 650.0000 mg | Freq: Four times a day (QID) | RECTAL | Status: DC | PRN

## 2012-08-29 MED ORDER — ENOXAPARIN SODIUM 40 MG/0.4ML ~~LOC~~ SOLN
40.0000 mg | SUBCUTANEOUS | Status: DC
  Administered 2012-08-29: 40 mg via SUBCUTANEOUS
  Filled 2012-08-29 (×2): qty 0.4

## 2012-08-29 MED ORDER — ACETAMINOPHEN 325 MG PO TABS: 650.0000 mg | ORAL_TABLET | Freq: Four times a day (QID) | ORAL | Status: DC | PRN

## 2012-08-29 MED ORDER — PANTOPRAZOLE SODIUM 40 MG PO TBEC: 40.0000 mg | DELAYED_RELEASE_TABLET | Freq: Every day | ORAL | Status: DC

## 2012-08-29 MED ORDER — ASPIRIN 81 MG PO CHEW: 81.0000 mg | CHEWABLE_TABLET | Freq: Every day | ORAL | Status: DC

## 2012-08-29 MED ORDER — ONDANSETRON HCL 4 MG PO TABS: 4.0000 mg | ORAL_TABLET | Freq: Four times a day (QID) | ORAL | Status: DC | PRN

## 2012-08-29 MED ORDER — SODIUM CHLORIDE 0.9 % IV SOLN
INTRAVENOUS | Status: AC
  Administered 2012-08-29: 20:00:00 via INTRAVENOUS

## 2012-08-29 MED ORDER — ONDANSETRON HCL 4 MG/2ML IJ SOLN: 4.0000 mg | Freq: Four times a day (QID) | INTRAMUSCULAR | Status: DC | PRN

## 2012-08-29 MED ORDER — ATORVASTATIN CALCIUM 80 MG PO TABS
80.0000 mg | ORAL_TABLET | Freq: Every evening | ORAL | Status: DC
  Administered 2012-08-29: 80 mg via ORAL
  Filled 2012-08-29 (×2): qty 1

## 2012-08-29 MED ORDER — CITALOPRAM HYDROBROMIDE 20 MG PO TABS
20.0000 mg | ORAL_TABLET | Freq: Every day | ORAL | Status: DC
  Filled 2012-08-29: qty 1

## 2012-08-29 MED ORDER — SODIUM CHLORIDE 0.9 % IV BOLUS (SEPSIS)
1000.0000 mL | Freq: Once | INTRAVENOUS | Status: AC
  Administered 2012-08-29: 1000 mL via INTRAVENOUS

## 2012-08-29 MED ORDER — HYDRALAZINE HCL 20 MG/ML IJ SOLN
10.0000 mg | Freq: Four times a day (QID) | INTRAMUSCULAR | Status: DC | PRN
  Filled 2012-08-29: qty 0.5

## 2012-08-29 MED ORDER — ONDANSETRON HCL 4 MG/2ML IJ SOLN
INTRAMUSCULAR | Status: AC
  Filled 2012-08-29: qty 2

## 2012-08-29 MED ORDER — ALBUTEROL SULFATE (5 MG/ML) 0.5% IN NEBU: 2.5000 mg | INHALATION_SOLUTION | RESPIRATORY_TRACT | Status: DC | PRN

## 2012-08-29 NOTE — ED Notes (Addendum)
Per ems- Pt was at work, fell from standing position. Started feeling dizzy and fell over. Tile floor, unsure of LOC. C/o pain across forehead, no swelling or bruising. No neuro deficits. 230/122 initially, 152/86 upon arrival, CBG 85. EKG unremarkable. A x 4. 20 in left AC. 4 mg zofran given due to nausea

## 2012-08-29 NOTE — H&P (Signed)
[name] is an 71 y.o. female.    PCP: Ralene Ok, MD  Her primary cardiologist is Dr. 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 She was afraid that there was a child behind her, so she turned around quickly and took a step back and then the next thing she knew she was dizzy and lightheaded and then she fell on the ground. Does not recall if she had the passing out episode before the fall or after the fall. There was no reported history of any seizure type activity. No urine or stool incontinence. Patient denies any chest pain, shortness of breath, nausea, vomiting, or abdominal pain before or after this episode. She denies any focal weakness. She had been standing and walking around for at least 10-15 minutes before this event occurred. She tells me that she does get hot spells, multiple times a day, but this has been ongoing for a long period of time. She denies any recent illness. She has been drinking fluids during the course of the day today. Denies any vision changes. Denies any neck pains. No history of fever or chills. Denies previous episodes of the same. Denies any ear pain, hearing loss, ringing in the ears.   Home Medications: Prior to Admission medications   Medication Sig Start Date End Date Taking? Authorizing Provider  aspirin 81 MG tablet Take 81 mg by mouth daily.     Yes Historical Provider, MD  atorvastatin (LIPITOR) 80 MG tablet Take 80 mg by mouth every evening.  07/27/12  Yes Historical Provider, MD  citalopram (CELEXA) 20 MG tablet Take 20 mg by mouth daily.     Yes Historical Provider, MD  furosemide (LASIX) 20 MG tablet Take 20 mg by mouth daily.     Yes Historical Provider, MD  nebivolol (BYSTOLIC) 5 MG tablet Take 5 mg  by mouth daily.   Yes Historical Provider, MD  omeprazole (PRILOSEC) 20 MG capsule Take 20 mg by mouth daily.     Yes Historical Provider, MD  triamcinolone (KENALOG) 0.1 % cream Apply 1 application topically as needed. For itching   Yes Historical Provider, MD    Allergies: No Known Allergies  Past Medical History: Past Medical History  Diagnosis Date  . GERD (gastroesophageal reflux disease)   . HTN (hypertension)   . Depression   . CAD (coronary artery disease)     nonobstructive  . HLD (hyperlipidemia)     Past Surgical History  Procedure Date  . Vein ligation and stripping   . Total abdominal hysterectomy w/ bilateral salpingoophorectomy   . Tonsillectomy and adenoidectomy   . Cataract extraction     Social History:  reports that she has never smoked. She has never used smokeless tobacco. She reports that she does not drink alcohol or use illicit drugs.  Family History:  Family History  Problem Relation Age of Onset  . Heart disease    . Suicidality    . Diabetes    . Heart attack    . Coronary artery disease      Review of Systems - History obtained from the patient General ROS: negative Psychological ROS: negative Ophthalmic ROS: negative ENT ROS: negative Allergy and Immunology ROS: negative Hematological and Lymphatic ROS: negative Endocrine ROS: negative Respiratory ROS: no cough,  shortness of breath, or wheezing Cardiovascular ROS: no chest pain or dyspnea on exertion Gastrointestinal ROS: no abdominal pain, change in bowel habits, or black or bloody stools Genito-Urinary ROS: no dysuria, trouble voiding, or hematuria Musculoskeletal ROS: negative Neurological ROS: as in hpi Dermatological ROS: negative  Physical Examination Blood pressure 126/59, pulse 56, temperature 98 F (36.7 C), temperature source Oral, resp. rate 14, SpO2 99.00%.  General appearance: alert, cooperative, appears stated age, no distress and moderately obese Head:  Normocephalic, without obvious abnormality, atraumatic Eyes: conjunctivae/corneas clear. PERRL, EOM's intact.  Throat: lips, mucosa, and tongue normal; teeth and gums normal Neck: no adenopathy, no carotid bruit, no JVD, supple, symmetrical, trachea midline and thyroid not enlarged, symmetric, no tenderness/mass/nodules Back: symmetric, no curvature. ROM normal. No CVA tenderness. Resp: clear to auscultation bilaterally Cardio: regular rate and rhythm, S1, S2 normal, no murmur, click, rub or gallop GI: soft, non-tender; bowel sounds normal; no masses,  no organomegaly Extremities: extremities normal, atraumatic, no cyanosis or edema Pulses: 2+ and symmetric Skin: Skin color, texture, turgor normal. No rashes or lesions Lymph nodes: Cervical, supraclavicular, and axillary nodes normal. Neurologic: Alert and oriented X 3, normal strength and tone. Normal symmetric reflexes.   Laboratory Data: Results for orders placed during the hospital encounter of 08/29/12 (from the past 48 hour(s))  BASIC METABOLIC PANEL     Status: Abnormal   Collection Time   08/29/12  3:57 PM      Component Value Range Comment   Sodium 137  135 - 145 mEq/L    Potassium 3.9  3.5 - 5.1 mEq/L    Chloride 100  96 - 112 mEq/L    CO2 27  19 - 32 mEq/L    Glucose, Bld 99  70 - 99 mg/dL    BUN 18  6 - 23 mg/dL    Creatinine, Ser 1.61  0.50 - 1.10 mg/dL    Calcium 9.4  8.4 - 09.6 mg/dL    GFR calc non Af Amer 69 (*) >90 mL/min    GFR calc Af Amer 80 (*) >90 mL/min   CBC WITH DIFFERENTIAL     Status: Normal   Collection Time   08/29/12  3:57 PM      Component Value Range Comment   WBC 5.8  4.0 - 10.5 K/uL    RBC 4.45  3.87 - 5.11 MIL/uL    Hemoglobin 13.1  12.0 - 15.0 g/dL    HCT 04.5  40.9 - 81.1 %    MCV 84.7  78.0 - 100.0 fL    MCH 29.4  26.0 - 34.0 pg    MCHC 34.7  30.0 - 36.0 g/dL    RDW 91.4  78.2 - 95.6 %    Platelets 219  150 - 400 K/uL    Neutrophils Relative 61  43 - 77 %    Neutro Abs 3.5  1.7 - 7.7 K/uL     Lymphocytes Relative 31  12 - 46 %    Lymphs Abs 1.8  0.7 - 4.0 K/uL    Monocytes Relative 6  3 - 12 %    Monocytes Absolute 0.3  0.1 - 1.0 K/uL    Eosinophils Relative 2  0 - 5 %    Eosinophils Absolute 0.1  0.0 - 0.7 K/uL    Basophils Relative 0  0 - 1 %    Basophils Absolute 0.0  0.0 - 0.1 K/uL   TROPONIN I     Status: Normal  Collection Time   08/29/12  3:57 PM      Component Value Range Comment   Troponin I <0.30  <0.30 ng/mL   MAGNESIUM     Status: Normal   Collection Time   08/29/12  3:57 PM      Component Value Range Comment   Magnesium 1.8  1.5 - 2.5 mg/dL   PHOSPHORUS     Status: Normal   Collection Time   08/29/12  3:57 PM      Component Value Range Comment   Phosphorus 3.8  2.3 - 4.6 mg/dL   URINALYSIS, ROUTINE W REFLEX MICROSCOPIC     Status: Abnormal   Collection Time   08/29/12  4:36 PM      Component Value Range Comment   Color, Urine YELLOW  YELLOW    APPearance CLOUDY (*) CLEAR    Specific Gravity, Urine 1.011  1.005 - 1.030    pH 6.0  5.0 - 8.0    Glucose, UA NEGATIVE  NEGATIVE mg/dL    Hgb urine dipstick NEGATIVE  NEGATIVE    Bilirubin Urine NEGATIVE  NEGATIVE    Ketones, ur NEGATIVE  NEGATIVE mg/dL    Protein, ur NEGATIVE  NEGATIVE mg/dL    Urobilinogen, UA 0.2  0.0 - 1.0 mg/dL    Nitrite NEGATIVE  NEGATIVE    Leukocytes, UA LARGE (*) NEGATIVE   URINE MICROSCOPIC-ADD ON     Status: Abnormal   Collection Time   08/29/12  4:36 PM      Component Value Range Comment   Squamous Epithelial / LPF MANY (*) RARE    WBC, UA 7-10  <3 WBC/hpf    RBC / HPF 0-2  <3 RBC/hpf    Bacteria, UA RARE  RARE     Radiology Reports: Ct Head Wo Contrast  08/29/2012  *RADIOLOGY REPORT*  Clinical Data: Fall  CT HEAD WITHOUT CONTRAST  Technique:  Contiguous axial images were obtained from the base of the skull through the vertex without contrast.  Comparison: None.  Findings: Age appropriate atrophy.  Negative for intracranial hemorrhage.  No subdural hematoma.  Negative for  acute infarct or mass.  Negative for skull fracture  IMPRESSION: No acute abnormality.   Original Report Authenticated By: Camelia Phenes, M.D.     Electrocardiogram: Sinus rhythm at 52 beats per minute. Normal axis. Intervals are normal. No concerning ST or T-wave changes are noted. Compared to previous EKG this. EKG shows no new changes.  Assessment/Plan  Principal Problem:  *Syncope Active Problems:  HYPERLIPIDEMIA  DEPRESSION  HYPERTENSION  CORONARY ARTERY DISEASE   #1 syncope: Etiology remains unclear. It may have something to do with positional imbalance. There no focal neurological deficits. Cerebellar signs are normal. Other differential include arrhythmias. She is bradycardic at 50-60 beats per minute. However, her blood pressures are normal. She will be observed in the hospital overnight on telemetry. Troponins will be cycled. She'll undergo echocardiogram and carotid Dopplers. She was seen by physical and occupational therapy.  #2 history of hypertension: Due to bradycardia will hold her antihypertensive medication. Alternative agents will be prescribed as needed.  #3 history of hyperlipidemia: Continue with Lipitor.  #4 history of coronary artery disease: This has not require any intervention in the past. She had a negative stress echo last year. Mild CAD was noted in Feb 2006 by angiogram. Continue with aspirin for now. Cycle cardiac enzymes as stated above.  #5 history of depression. Continue with her antidepressant.  #6 Abnormal UA: Appears  to be a contaminated sample. Send for culture. Will not treat as she is asymptomatic and afebrile.  DVT, prophylaxis will be initiated.  She's a full code.  Further management decisions will depend on results of further testing and patient's response to treatment.  [facility]  [facility] Pager [phone]  08/29/2012, 6:15 PM

## 2012-08-29 NOTE — ED Notes (Signed)
Patient transported to CT 

## 2012-08-29 NOTE — ED Notes (Signed)
Admitting MD at bedside.

## 2012-08-29 NOTE — ED Provider Notes (Signed)
History     CSN: [id number]  Arrival date & time 08/29/12  1458   First MD Initiated Contact with Patient 08/29/12 1515      Chief Complaint  Patient presents with  . 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 Pt had no chest pain, palpitations, sob. Pt has a headache right now, nausea previously which was treated with zofran by EMS. Pt has no current  nausea, vomiting, visual complains, seizures, altered mental status, new weakness, or numbness. Pt has no neck pain, and denies any upper extremity symptoms. No long bone pain at this time. Thee is no hx of syncope, and patient has no new meds.  Patient is a 71 y.o. female presenting with fall. The history is provided by the patient.  Fall Associated symptoms include headaches. Pertinent negatives include no abdominal pain, no nausea, no vomiting and no hematuria.    Past Medical History  Diagnosis Date  . GERD (gastroesophageal reflux disease)   . HTN (hypertension)   . Depression   . CAD (coronary artery disease)     nonobstructive  . HLD (hyperlipidemia)     Past Surgical History  Procedure Date  . Vein ligation and stripping   . Total abdominal hysterectomy w/ bilateral salpingoophorectomy   . Tonsillectomy and adenoidectomy     Family History  Problem Relation Age of Onset  . Heart disease    . Suicidality    . Diabetes    . Heart attack    . Coronary artery disease      History  Substance Use Topics  . Smoking status: Never Smoker   . Smokeless tobacco: Never Used  . Alcohol Use: No    OB History     Grav Para Term Preterm Abortions TAB SAB Ect Mult Living                  Review of Systems  Constitutional: Negative for activity change.  HENT: Negative for facial swelling and neck pain.   Respiratory: Negative for cough, shortness of breath and wheezing.   Cardiovascular: Negative for chest pain.  Gastrointestinal: Negative for nausea, vomiting, abdominal pain, diarrhea, constipation, blood in stool and abdominal distention.  Genitourinary: Negative for hematuria and difficulty urinating.  Skin: Negative for color change.  Neurological: Positive for syncope, light-headedness and headaches. Negative for speech difficulty.  Hematological: Does not bruise/bleed easily.  Psychiatric/Behavioral: Negative for confusion.    Allergies  Review of patient's allergies indicates no known allergies.  Home Medications   Current Outpatient Rx  Name Route Sig Dispense Refill  . ASPIRIN 81 MG PO TABS Oral Take 81 mg by mouth daily.      Marland Kitchen CITALOPRAM HYDROBROMIDE 20 MG PO TABS Oral Take 20 mg by mouth daily.      . FUROSEMIDE 20 MG PO TABS Oral Take 20 mg by mouth daily.      . NEBIVOLOL HCL 5 MG PO TABS Oral Take 5 mg by mouth daily.    Marland Kitchen OMEPRAZOLE 20 MG PO CPDR Oral Take 20 mg by mouth daily.      Marland Kitchen PRAVASTATIN SODIUM 80 MG PO TABS Oral  Take 80 mg by mouth daily.      . TRIAMCINOLONE ACETONIDE 0.1 % EX CREA Topical Apply topically as needed.        BP 152/77  Pulse 54  Temp 98 F (36.7 C) (Oral)  Resp 14  SpO2 99%  Physical Exam  Nursing note and vitals reviewed. Constitutional: She is oriented to person, place, and time. She appears well-developed and well-nourished.  HENT:  Head: Normocephalic and atraumatic.       No midline c-spine tenderness, pt able to turn head to 45 degrees bilaterally without any pain and able to flex neck to the chest and extend without any pain or neurologic symptoms.   Eyes: EOM are normal. Pupils are equal, round, and reactive to light.  Neck:  Neck supple. No JVD present.  Cardiovascular: Normal rate, regular rhythm and normal heart sounds.   No murmur heard. Pulmonary/Chest: Effort normal. No respiratory distress.  Abdominal: Soft. She exhibits no distension and no mass. There is no tenderness. There is no rebound and no guarding.  Neurological: She is alert and oriented to person, place, and time.  Skin: Skin is warm and dry.    ED Course  Procedures (including critical care time)   Labs Reviewed  BASIC METABOLIC PANEL  CBC WITH DIFFERENTIAL  TROPONIN I  URINALYSIS, ROUTINE W REFLEX MICROSCOPIC  MAGNESIUM  PHOSPHORUS   No results found.   No diagnosis found.    MDM   Date: 08/29/2012  Rate:54  Rhythm: sinus bradycardia  QRS Axis: normal  Intervals: PR prolonged  ST/T Wave abnormalities: normal  Conduction Disutrbances:first-degree A-V block   Narrative Interpretation:   Old EKG Reviewed: unchanged  DDx includes: Orthostatic hypotension Stroke Vertebral artery dissection/stenosis Dysrhythmia PE Vasovagal/neurocardiogenic syncope Aortic stenosis Valvular disorder/Cardiomyopathy Anemia  Pt comes in with cc of syncope and a resultant fall. CT head will be ordered due to headache and nausea. Cspine cleared clinically. This appears to be a syncopal event.  We have the benefit of reviewing some cardiovascular tests (2006 cath, 2012 stress). Pt has hx of diastolic CHF - and so is a + for SF syncope rule - however, with dizziness and syncope precipitated with turning, this appears more of a neuro/vascular event. I am unable to reproduce the sx with turning, and she has no bruits on exam. Negative cerebellar exam as well, with EKG that is normal.  We will get basic labs, and orthostatics. If orthostatic +, will hydrate and discharge. If O/S negative - will consider calling the hospitalist for possible admission.           Derwood Kaplan, MD 08/29/12 1546

## 2012-08-30 DIAGNOSIS — R55 Syncope and collapse: Secondary | ICD-10-CM

## 2012-08-30 LAB — COMPREHENSIVE METABOLIC PANEL
ALT: 16 U/L (ref 0–35)
AST: 22 U/L (ref 0–37)
Albumin: 3.3 g/dL — ABNORMAL LOW (ref 3.5–5.2)
Alkaline Phosphatase: 65 U/L (ref 39–117)
Potassium: 4 mEq/L (ref 3.5–5.1)
Sodium: 140 mEq/L (ref 135–145)
Total Protein: 5.8 g/dL — ABNORMAL LOW (ref 6.0–8.3)

## 2012-08-30 LAB — TROPONIN I
Troponin I: <0.3 ng/mL (ref <0.30)
Troponin I: <0.3 ng/mL (ref <0.30)

## 2012-08-30 LAB — CBC
HCT: 35.2 % — ABNORMAL LOW (ref 36.0–46.0)
MCHC: 34.1 g/dL (ref 30.0–36.0)
MCV: 85.9 fL (ref 78.0–100.0)
RDW: 13.3 % (ref 11.5–15.5)

## 2012-08-30 MED ORDER — NEBIVOLOL HCL 2.5 MG PO TABS: 2.5000 mg | ORAL_TABLET | Freq: Every day | ORAL | Status: DC

## 2012-08-30 NOTE — Progress Notes (Signed)
*  PRELIMINARY RESULTS* Vascular Ultrasound Carotid Duplex (Doppler) has been completed.  Preliminary findings: Bilaterally no significant ICA stenosis with antegrade vertebral flow.  Farrel Demark, RDMS, RVT  08/30/2012, 12:57 PM

## 2012-08-30 NOTE — Discharge Summary (Signed)
Physician Discharge Summary  [name]:[id number] DOB: Oct 03, 1941 DOA: 08/29/2012  PCP: Ralene Ok, MD  Admit date: 08/29/2012 Discharge date: 08/30/2012  Recommendations for Outpatient Follow-up:  1. Followup with primary medical doctor in one week from hospital discharge. 2. Followup with primary cardiologist in one week from hospital discharge.   Discharge Diagnoses:  Principal Problem:  *Syncope Active Problems:  HYPERLIPIDEMIA  DEPRESSION  HYPERTENSION  CORONARY ARTERY DISEASE   Discharge Condition: Improved and stable.  Diet recommendation: Heart healthy.  Filed Weights   08/29/12 1906  Weight: 96.5 kg (212 lb 11.9 oz)    History of present illness:  71 year old female with history of nonobstructive coronary artery disease, hypertension, hypercholesterolemia, active and teaches kindergarten class presented to the emergency department after passing out in class. She was in her usual state of health at school without any symptoms. 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 They cannot speak and sometimes grab her when they need something, especially during lunchtime. She felt that one of those students was behind her and so she turned rapidly felt dizzy and lightheaded and then she does not remember anything. She is unable to tell how long she passed out. There was no preceding or subsequent chest pain, palpitations or dyspnea. She hit her right side of her forehead and right thigh.  Hospital Course:  #1 syncope: Etiology remains unclear. It may have something to do with positional imbalance. There no focal neurological deficits. Cerebellar signs are normal. Carotid Dopplers were negative. 2-D echo was a poor study but discussion with cardiologist suggests no etiology to explain syncope. Possibly secondary to transient hypotension versus vasovagal. Patient denies any symptoms at this time.  #2 history of hypertension: Patient has not had similar episodes of  passing out in the past. She has been on Bystolic. Reduced the dose from 5-2.5 mg daily and she can follow up with her primary cardiologist regarding adjustment of dose or changing medications.  #3 history of hyperlipidemia: Continue with Lipitor.   #4 history of coronary artery disease: This has not required any intervention in the past. She had a negative stress echo last year. Mild CAD was noted in Feb 2006 by angiogram. Continue with aspirin for now.   #5 history of depression. Continue with her antidepressant.   #6 Abnormal UA: Appears to be a contaminated sample. Sent for culture. Will not treat as she is asymptomatic and afebrile.   Procedures:  None.  Consultations:  None.  Discharge Exam:  Complaints: Patient denies any complaints. No headache, dizziness, lightheadedness, chest pain, palpitations or dyspnea. Filed Vitals:   08/30/12 1422  BP: 152/69  Pulse: 57  Temp: 96.7 F (35.9 C)  Resp: 17   Filed Vitals:   08/29/12 1725 08/29/12 1825 08/29/12 1906 08/30/12 1422  BP: 126/59 143/72 152/81 152/69  Pulse: 56 56 52 57  Temp:   97.8 F (36.6 C) 96.7 F (35.9 C)  TempSrc:   Oral Oral  Resp:  14 16 17   Height:   5\' 5"  (1.651 m)   Weight:   96.5 kg (212 lb 11.9 oz)   SpO2:  96% 98% 100%    General exam: Moderately built and nourished female who is in no obvious distress. Respiratory system: Clear to auscultation and no increased work of breathing. Cardiovascular system: First and second heart sounds heard, regular mildly bradycardic. No JVD, murmurs or pedal edema. Gastrointestinal system: Abdomen is nondistended, soft and nontender. Normal bowel sounds heard. Central system: Alert and oriented.  No focal neurological deficits. Extremities: Symmetric 5 x 5 power. Small area of bruising on the right lower lateral thigh but without other acute findings.   Discharge Instructions  Discharge Orders    Future Orders Please Complete By Expires   Diet - low sodium  heart healthy      Increase activity slowly      Call MD for:  persistant dizziness or light-headedness      Call MD for:  extreme fatigue        Medication List  As of 08/30/2012  6:53 PM   TAKE these medications         aspirin 81 MG tablet   Take 81 mg by mouth daily.      atorvastatin 80 MG tablet   Commonly known as: LIPITOR   Take 80 mg by mouth every evening.      citalopram 20 MG tablet   Commonly known as: CELEXA   Take 20 mg by mouth daily.      furosemide 20 MG tablet   Commonly known as: LASIX   Take 20 mg by mouth daily.      nebivolol 2.5 MG tablet   Commonly known as: BYSTOLIC   Take 1 tablet (2.5 mg total) by mouth daily.      omeprazole 20 MG capsule   Commonly known as: PRILOSEC   Take 20 mg by mouth daily.      triamcinolone cream 0.1 %   Commonly known as: KENALOG   Apply 1 application topically as needed. For itching           Follow-up Information    Follow up with MOREIRA,ROY, MD. Schedule an appointment as soon as possible for a visit in 1 week.   Contact information:   [address] [place] [address] [phone]       Follow up with Olga Millers, MD. Schedule an appointment as soon as possible for a visit in 1 week.   Contact information:   [address] [place], [address] [place] [address] [phone]           The results of significant diagnostics from this hospitalization (including imaging, microbiology, ancillary and laboratory) are listed below for reference.    Significant Diagnostic Studies: Ct Head Wo Contrast  08/29/2012  *RADIOLOGY REPORT*  Clinical Data: Fall  CT HEAD WITHOUT CONTRAST  Technique:  Contiguous axial images were obtained from the base of the skull through the vertex without contrast.  Comparison: None.  Findings: Age appropriate atrophy.  Negative for intracranial hemorrhage.  No subdural hematoma.  Negative for acute infarct or mass.  Negative for  skull fracture  IMPRESSION: No acute abnormality.   Original Report Authenticated By: Camelia Phenes, M.D.    Carotid Dopplers  Preliminary findings: Bilaterally no significant ICA stenosis with antegrade vertebral flow  Microbiology: No results found for this or any previous visit (from the past 240 hour(s)).   Labs: Basic Metabolic Panel:  Lab 08/30/12 5784 08/29/12 1557  NA 140 137  K 4.0 3.9  CL 105 100  CO2 28 27  GLUCOSE 107* 99  BUN 15 18  CREATININE 0.81 0.84  CALCIUM 9.0 9.4  MG -- 1.8  PHOS -- 3.8   Liver Function Tests:  Lab 08/30/12 0715  AST 22  ALT 16  ALKPHOS 65  BILITOT 0.6  PROT 5.8*  ALBUMIN 3.3*   No results found for this basename: LIPASE:5,AMYLASE:5 in the  last 168 hours No results found for this basename: AMMONIA:5 in the last 168 hours CBC:  Lab 08/30/12 0715 08/29/12 1557  WBC 5.5 5.8  NEUTROABS -- 3.5  HGB 12.0 13.1  HCT 35.2* 37.7  MCV 85.9 84.7  PLT 184 219   Cardiac Enzymes:  Lab 08/30/12 0715 08/30/12 0129 08/29/12 2010 08/29/12 1557  CKTOTAL -- -- -- --  CKMB -- -- -- --  CKMBINDEX -- -- -- --  TROPONINI <0.30 <0.30 <0.30 <0.30   BNP: BNP (last 3 results) No results found for this basename: PROBNP:3 in the last 8760 hours CBG: No results found for this basename: GLUCAP:5 in the last 168 hours  Other Lab data:  1. TSH 2.745. 2. UA: Large leukocytes, 7-10 white blood cells and many squamous epithelial cells and rare bacteria. 3. Urine culture: Pending   Time coordinating discharge: Less than 30 minutes  Signed:  HONGALGI,ANAND  [facility] 08/30/2012, 6:53 PM

## 2012-08-30 NOTE — Evaluation (Signed)
Occupational Therapy Evaluation Patient Details Name: [name] MRN: [id number] DOB: 12-21-1941 Today's Date: 08/30/2012 Time: 7829-5621 OT Time Calculation (min): 26 min  OT Assessment / Plan / Recommendation Clinical Impression  Pt admitted after syncopal episode at work.  No further episodes or evidence of dizziness.  Pt is independent in mobility and ADL.  No further OT needs.    OT Assessment  Patient does not need any further OT services    Follow Up Recommendations  No OT follow up    Barriers to Discharge      Equipment Recommendations       Recommendations for Other Services    Frequency       Precautions / Restrictions Precautions Precautions: None   Pertinent Vitals/Pain No pain    ADL  Eating/Feeding: Simulated;Independent Where Assessed - Eating/Feeding: Edge of bed Grooming: Performed;Wash/dry hands;Independent Where Assessed - Grooming: Unsupported standing Upper Body Bathing: Simulated;Independent Where Assessed - Upper Body Bathing: Unsupported standing Lower Body Bathing: Simulated;Independent Where Assessed - Lower Body Bathing: Unsupported standing Upper Body Dressing: Performed;Independent Where Assessed - Upper Body Dressing: Unsupported standing Lower Body Dressing: Performed;Independent Where Assessed - Lower Body Dressing: Unsupported sit to stand Toilet Transfer: Performed;Independent Toilet Transfer Method: Sit to Barista: Regular height toilet Toileting - Clothing Manipulation and Hygiene: Performed;Independent Where Assessed - Toileting Clothing Manipulation and Hygiene: Sit on 3-in-1 or toilet Transfers/Ambulation Related to ADLs: independent ADL Comments: independent    OT Diagnosis:    OT Problem List:   OT Treatment Interventions:     OT Goals    Visit Information  Last OT Received On: 08/30/12    Subjective Data  Subjective: "I work at a daycare." Patient Stated Goal: Return to work.     Prior Functioning  Vision/Perception  Home Living Lives With: Spouse Available Help at Discharge: Family Type of Home: House Home Access: Stairs to enter Secretary/administrator of Steps: 1 Entrance Stairs-Rails: None Home Layout: One level Bathroom Shower/Tub: Tub/shower unit;Walk-in shower Bathroom Toilet: Standard Home Adaptive Equipment: Grab bars in shower Prior Function Level of Independence: Independent Able to Take Stairs?: Yes Driving: Yes Vocation: Part time employment Communication Communication: No difficulties Dominant Hand: Right      Cognition  Overall Cognitive Status: Appears within functional limits for tasks assessed/performed Arousal/Alertness: Awake/alert Behavior During Session: [facility] for tasks performed    Extremity/Trunk Assessment Right Upper Extremity Assessment RUE ROM/Strength/Tone: Within functional levels RUE Coordination: WFL - gross/fine motor Left Upper Extremity Assessment LUE ROM/Strength/Tone: Within functional levels LUE Sensation: WFL - Light Touch;WFL - Proprioception LUE Coordination: WFL - gross/fine motor Trunk Assessment Trunk Assessment: Normal   Mobility  Shoulder Instructions  Bed Mobility Bed Mobility: Supine to Sit;Sit to Supine;Sitting - Scoot to Edge of Bed Supine to Sit: 7: Independent Sitting - Scoot to [place] of Bed: 7: Independent Sit to Supine: 7: Independent Transfers Transfers: Sit to Stand;Stand to Sit Sit to Stand: 7: Independent Stand to Sit: 7: Independent       Exercise     Balance     End of Session OT - End of Session Activity Tolerance: Patient tolerated treatment well Patient left: in bed;with call bell/phone within reach Nurse Communication: Mobility status  GO Functional Assessment Tool Used: clinical judgement Functional Limitation: Self care Self Care Current Status (H0865): 0 percent impaired, limited or restricted Self Care Goal Status (H8469): 0 percent impaired, limited or  restricted Self Care Discharge Status (G2952): 0 percent impaired, limited or restricted  Evern Bio 08/30/2012, 10:47 AM [phone]

## 2012-08-30 NOTE — Progress Notes (Signed)
PT SCREEN NOTE:  08/30/2012  Pt presents with no acute PT needs per OT and pt reports.  Acute PT signing off.  Please feel free to re-order if pt mobility status declines.  Lyza Houseworth L. Tamotsu Wiederholt DPT [phone]

## 2012-08-30 NOTE — Progress Notes (Signed)
  Echocardiogram 2D Echocardiogram has been performed.  [name] 08/30/2012, 3:37 PM

## 2012-08-31 LAB — URINE CULTURE

## 2012-12-01 ENCOUNTER — Other Ambulatory Visit: Payer: Self-pay | Admitting: Internal Medicine

## 2012-12-01 DIAGNOSIS — Z1231 Encounter for screening mammogram for malignant neoplasm of breast: Secondary | ICD-10-CM

## 2013-01-25 ENCOUNTER — Ambulatory Visit: Payer: Medicare Other

## 2013-11-09 ENCOUNTER — Other Ambulatory Visit (HOSPITAL_COMMUNITY): Payer: Self-pay | Admitting: Internal Medicine

## 2013-11-09 DIAGNOSIS — Z1231 Encounter for screening mammogram for malignant neoplasm of breast: Secondary | ICD-10-CM

## 2013-11-13 ENCOUNTER — Ambulatory Visit (HOSPITAL_COMMUNITY)
Admission: RE | Admit: 2013-11-13 | Discharge: 2013-11-13 | Disposition: A | Discharge status: Home / Self Care | Payer: Medicare Other | Source: Ambulatory Visit | Attending: Internal Medicine | Admitting: Internal Medicine

## 2013-11-13 ENCOUNTER — Other Ambulatory Visit (HOSPITAL_COMMUNITY): Payer: Self-pay | Admitting: Internal Medicine

## 2013-11-13 ENCOUNTER — Ambulatory Visit (HOSPITAL_COMMUNITY): Payer: Medicare Other

## 2013-11-13 DIAGNOSIS — Z1231 Encounter for screening mammogram for malignant neoplasm of breast: Secondary | ICD-10-CM

## 2014-06-14 LAB — LIPID PANEL
Cholesterol: 150 mg/dL (ref 0–200)
HDL: 56 mg/dL (ref 35–70)
LDL CALC: 79 mg/dL

## 2014-06-14 LAB — HEPATIC FUNCTION PANEL
ALK PHOS: 66 U/L (ref 25–125)
ALT: 29 U/L (ref 7–35)
BILIRUBIN, TOTAL: 1 mg/dL

## 2014-06-14 LAB — BASIC METABOLIC PANEL
BUN: 14 mg/dL (ref 4–21)
Creatinine: 0.8 mg/dL (ref 0.5–1.1)
Glucose: 78 mg/dL
SODIUM: 140 mmol/L (ref 137–147)

## 2014-06-14 LAB — HEMOGLOBIN A1C: Hgb A1c MFr Bld: 5.5 % (ref 4.0–6.0)

## 2014-09-21 ENCOUNTER — Encounter: Payer: Self-pay | Admitting: Gastroenterology

## 2014-10-10 ENCOUNTER — Other Ambulatory Visit (HOSPITAL_COMMUNITY): Payer: Self-pay | Admitting: Internal Medicine

## 2014-10-10 DIAGNOSIS — Z1231 Encounter for screening mammogram for malignant neoplasm of breast: Secondary | ICD-10-CM

## 2014-10-11 ENCOUNTER — Other Ambulatory Visit (HOSPITAL_COMMUNITY): Payer: Self-pay | Admitting: Internal Medicine

## 2014-10-11 DIAGNOSIS — M810 Age-related osteoporosis without current pathological fracture: Secondary | ICD-10-CM

## 2014-11-19 ENCOUNTER — Ambulatory Visit (HOSPITAL_COMMUNITY)
Admission: RE | Admit: 2014-11-19 | Discharge: 2014-11-19 | Disposition: A | Discharge status: Home / Self Care | Payer: Medicare Other | Source: Ambulatory Visit | Attending: Internal Medicine | Admitting: Internal Medicine

## 2014-11-19 ENCOUNTER — Ambulatory Visit (HOSPITAL_COMMUNITY): Payer: Medicare Other

## 2014-11-19 ENCOUNTER — Other Ambulatory Visit (HOSPITAL_COMMUNITY): Payer: Self-pay | Admitting: Internal Medicine

## 2014-11-19 DIAGNOSIS — M810 Age-related osteoporosis without current pathological fracture: Secondary | ICD-10-CM | POA: Diagnosis not present

## 2014-11-19 DIAGNOSIS — Z1231 Encounter for screening mammogram for malignant neoplasm of breast: Secondary | ICD-10-CM

## 2014-11-19 LAB — HM DEXA SCAN: HM Dexa Scan: NORMAL

## 2014-11-19 LAB — HM MAMMOGRAPHY: HM Mammogram: NORMAL

## 2015-01-03 LAB — BASIC METABOLIC PANEL
BUN: 16 mg/dL (ref 4–21)
Creatinine: 0.6 mg/dL (ref 0.5–1.1)
GLUCOSE: 79 mg/dL
Potassium: 3.8 mmol/L (ref 3.4–5.3)
Sodium: 139 mmol/L (ref 137–147)

## 2015-01-03 LAB — HEPATIC FUNCTION PANEL
ALT: 16 U/L (ref 7–35)
AST: 20 U/L (ref 13–35)
Alkaline Phosphatase: 61 U/L (ref 25–125)
BILIRUBIN, TOTAL: 0.9 mg/dL

## 2015-01-03 LAB — LIPID PANEL
Cholesterol: 165 mg/dL (ref 0–200)
HDL: 69 mg/dL (ref 35–70)
LDL Cholesterol: 79 mg/dL
Triglycerides: 74 mg/dL (ref 40–160)

## 2015-08-25 ENCOUNTER — Telehealth: Payer: Self-pay | Admitting: Internal Medicine

## 2015-08-25 NOTE — Telephone Encounter (Signed)
I usually take a week off around Christmas. I have not made plans with family yet as to when we will be out. I would hate to have her change the spot and then me be out of the office. I would be fine with this change otherwise. I am sorry. She can still make the change if she wants in case I take the week before but please let her know it might have to be rescheduled.

## 2015-08-25 NOTE — Telephone Encounter (Signed)
Pt has a new pt visit on 12/01/15. 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 She would like to know if she could come in on one of those days.  You do have an 8:15 new pt appt 12/28, but its on a wednesday (not a 50+ slot) Is it ok to change her appt to that day?

## 2015-10-10 DIAGNOSIS — Z23 Encounter for immunization: Secondary | ICD-10-CM | POA: Diagnosis not present

## 2015-11-11 NOTE — Telephone Encounter (Signed)
Pt has now retired and does not need to change appt

## 2015-11-26 ENCOUNTER — Other Ambulatory Visit: Payer: Self-pay

## 2015-11-26 DIAGNOSIS — Z1231 Encounter for screening mammogram for malignant neoplasm of breast: Secondary | ICD-10-CM

## 2015-11-28 ENCOUNTER — Encounter: Payer: Self-pay | Admitting: Family Medicine

## 2015-12-01 ENCOUNTER — Encounter: Payer: Self-pay | Admitting: Family Medicine

## 2015-12-01 ENCOUNTER — Ambulatory Visit (INDEPENDENT_AMBULATORY_CARE_PROVIDER_SITE_OTHER): Payer: Medicare HMO | Admitting: Family Medicine

## 2015-12-01 VITALS — BP 138/82 | Temp 98.0°F | Ht 65.0 in | Wt 194.0 lb

## 2015-12-01 DIAGNOSIS — R413 Other amnesia: Secondary | ICD-10-CM

## 2015-12-01 DIAGNOSIS — E785 Hyperlipidemia, unspecified: Secondary | ICD-10-CM

## 2015-12-01 DIAGNOSIS — R739 Hyperglycemia, unspecified: Secondary | ICD-10-CM | POA: Diagnosis not present

## 2015-12-01 DIAGNOSIS — I2510 Atherosclerotic heart disease of native coronary artery without angina pectoris: Secondary | ICD-10-CM

## 2015-12-01 DIAGNOSIS — F32A Depression, unspecified: Secondary | ICD-10-CM

## 2015-12-01 DIAGNOSIS — I8393 Asymptomatic varicose veins of bilateral lower extremities: Secondary | ICD-10-CM | POA: Insufficient documentation

## 2015-12-01 DIAGNOSIS — M1990 Unspecified osteoarthritis, unspecified site: Secondary | ICD-10-CM | POA: Diagnosis not present

## 2015-12-01 DIAGNOSIS — H409 Unspecified glaucoma: Secondary | ICD-10-CM | POA: Insufficient documentation

## 2015-12-01 DIAGNOSIS — F329 Major depressive disorder, single episode, unspecified: Secondary | ICD-10-CM

## 2015-12-01 DIAGNOSIS — G4700 Insomnia, unspecified: Secondary | ICD-10-CM | POA: Insufficient documentation

## 2015-12-01 DIAGNOSIS — M2040 Other hammer toe(s) (acquired), unspecified foot: Secondary | ICD-10-CM | POA: Insufficient documentation

## 2015-12-01 LAB — CBC
HCT: 40.8 % (ref 36.0–46.0)
Hemoglobin: 13.5 g/dL (ref 12.0–15.0)
MCHC: 33.2 g/dL (ref 30.0–36.0)
MCV: 88.7 fl (ref 78.0–100.0)
Platelets: 227 10*3/uL (ref 150.0–400.0)
RBC: 4.6 Mil/uL (ref 3.87–5.11)
RDW: 14.3 % (ref 11.5–15.5)
WBC: 5.7 10*3/uL (ref 4.0–10.5)

## 2015-12-01 LAB — HEMOGLOBIN A1C: Hgb A1c MFr Bld: 5.4 % (ref 4.6–6.5)

## 2015-12-01 LAB — LDL CHOLESTEROL, DIRECT: Direct LDL: 74 mg/dL

## 2015-12-01 MED ORDER — ATORVASTATIN CALCIUM 80 MG PO TABS: 80.0000 mg | ORAL_TABLET | Freq: Every evening | ORAL | Status: DC

## 2015-12-01 MED ORDER — OMEPRAZOLE 20 MG PO CPDR: 20.0000 mg | DELAYED_RELEASE_CAPSULE | Freq: Every day | ORAL | Status: DC

## 2015-12-01 MED ORDER — CITALOPRAM HYDROBROMIDE 20 MG PO TABS
20.0000 mg | ORAL_TABLET | Freq: Every day | ORAL | Status: DC
Start: 2015-12-01 — End: 2016-07-13

## 2015-12-01 NOTE — Assessment & Plan Note (Signed)
S: history of nonobstructive coronary artery disease: This has not required any intervention in the past. She had a negative stress echo 2012. Mild CAD was noted in Feb 1st 2006 by angiogram. Stopped ASA on her own due to bruising. asymptomatic. . Is on atorvastatin A/P: restart aspirin every other day- stopped due to easy bruising but continues to bruise anyway. Continue statin. Patient considering returning to cardiology

## 2015-12-01 NOTE — Progress Notes (Signed)
Kelsey Reddish, MD Phone: [phone]  Subjective:  Patient presents today to establish care. Chief complaint-noted.   See problem oriented charting  The following were reviewed and entered/updated in epic: Past Medical History  Diagnosis Date  . GERD (gastroesophageal reflux disease)   . HTN (hypertension)   . Depression   . CAD (coronary artery disease)     nonobstructive  . HLD (hyperlipidemia)   . Exertional dyspnea 08/29/2012  . Anginal pain ([place]) 08/29/2012    "have had problems w/this"  . Syncope and collapse 08/29/2012    "first time ever"  . Arthritis 08/29/2012    right hand, Seen by Dr. Estanislado Pandy  . Anxiety 08/29/2012  . PNEUMONIA 07/04/2007    Qualifier: Diagnosis of  By: Larose Kells MD, Keenan Bachelor toe     surgery x2, second with Dr. Sharol Given- only other option amputatoin   Patient Active Problem List   Diagnosis Date Noted  . nonobstructive CAD (coronary artery disease) 12/01/2015    Priority: High  . Memory loss 12/01/2015    Priority: High  . 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 Hyperlipidemia 05/19/2007    Priority: Medium  . Depression 05/19/2007    Priority: Medium  . Essential hypertension 05/19/2007    Priority: Medium  . Insomnia 12/01/2015    Priority: Low  . Glaucoma 12/01/2015    Priority: Low  . Hammer toe     Priority: Low  . Arthritis 08/29/2012    Priority: Low  . GERD 09/06/2009    Priority: Low  . Edema 09/06/2009    Priority: Low  . History of syncope 09/06/2009    Priority: Low  . Hyperglycemia 05/19/2007    Priority: Low   Past Surgical History  Procedure Laterality Date  . Vein ligation and stripping  2012    bilaterally  . Abdominal hysterectomy  1970's  . Cataract extraction w/ intraocular lens  implant, bilateral  ~ 2010    bilateral  . Tonsillectomy and adenoidectomy  1950's  . Appendectomy  ~ 1970    Family History  Problem Relation Age of Onset  . Heart disease Mother     brother, father  . Suicidality  Father     62  . Diabetes Mother     sister, brother  . Coronary artery disease Brother     father  . Alcoholism Brother   . Ovarian cancer Daughter     Medications- reviewed and updated Current Outpatient Prescriptions  Medication Sig Dispense Refill  . aspirin 81 MG tablet Take 81 mg by mouth every other day.    Marland Kitchen atorvastatin (LIPITOR) 80 MG tablet Take 80 mg by mouth every evening.     . citalopram (CELEXA) 20 MG tablet Take 20 mg by mouth daily.      Marland Kitchen omeprazole (PRILOSEC) 20 MG capsule Take 20 mg by mouth daily.       No current facility-administered medications for this visit.    Allergies-reviewed and updated Allergies  Allergen Reactions  . Clindamycin/Lincomycin     Severe rash and into throat    Social History   Social History  . Marital Status: Widowed    Spouse Name: N/A  . Number of Children: N/A  . Years of Education: N/A   Social History Main Topics  . Smoking status: Never Smoker   . Smokeless tobacco: Never Used  . Alcohol Use: No  . Drug Use: No  . Sexual Activity: No  Other Topics Concern  . None   Social History Narrative   Widowed 20-Oct-2013. Daughter died Apr 20, 2012. 2 living children. 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      Hobbies: gardening, time with dog, time with grandkids    ROS--Full ROS was completed Review of Systems  Constitutional: Negative for fever and chills.  HENT: Negative for ear pain.   Eyes: Negative for pain and discharge.  Respiratory: Negative for cough and hemoptysis.   Cardiovascular: Negative for chest pain and palpitations.  Gastrointestinal: Positive for heartburn. Negative for nausea and vomiting.  Genitourinary: Negative for dysuria and urgency.  Musculoskeletal: Positive for joint pain. Negative for neck pain.  Skin: Negative for itching and rash.  Neurological:  Negative for dizziness, tingling and headaches.  Endo/Heme/Allergies: Negative for polydipsia. Bruises/bleeds easily.  Psychiatric/Behavioral: Positive for depression. Negative for substance abuse.   Objective: BP 138/82 mmHg  Temp(Src) 98 F (36.7 C)  Ht 5\' 5"  (1.651 m)  Wt 194 lb (87.998 kg)  BMI 32.28 kg/m2 Gen: NAD, resting comfortably HEENT: Mucous membranes are moist. Oropharynx normal. TM normal. Eyes: sclera and lids normal, PERRLA Neck: no thyromegaly, no cervical lymphadenopathy CV: RRR no murmurs rubs or gallops Lungs: CTAB no crackles, wheeze, rhonchi Abdomen: soft/nontender/nondistended/normal bowel sounds. No rebound or guarding.  Ext: no edema Skin: warm, dry Neuro: 5/5 strength in upper and lower extremities, normal gait, normal reflexes  Assessment/Plan:  Memory loss S: complains of issues with memory over last year. Seems to be getting worse. 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  A/P: MMSE 26/30 today. We will trend at 6 month visit (3 off for serial 7s, 1 loss for ifs,ands, buts).    Hyperlipidemia S: LDL 79 last check. Not taking aspirin. Is on atorvastatin A/P: restart aspirin every other day- stopped due to easy bruising but continues to bruise anyway. LDL goal <100 reasonable consider nonobstructive CAD and on max dose statin   Depression S: reasonable control on citalopram 20mg . Still struggling at times with loss of husband and daughter which is understandable A/P: in part of reversible causes of memory loss- will get PHQ9 next visit. Refilled meds.    nonobstructive CAD (coronary artery disease) S: history of nonobstructive coronary artery disease: This has not required any intervention in the past. She had a negative stress echo 2011-04-21. Mild CAD was noted in Feb 1st 2006 by angiogram. Stopped ASA on her own due to bruising. asymptomatic. . Is on atorvastatin A/P: restart  aspirin every other day- stopped due to easy bruising but continues to bruise anyway. Continue statin. Patient considering returning to cardiology   Immunizations PNA 04-20-13- will request records  6 months follow up Return precautions advised.   History of hand arthritis followed by Dr. Estanislado Pandy. Requests return.  Orders Placed This Encounter  Procedures  . Hemoglobin A1c    [place]  . CBC    [place]  . LDL cholesterol, direct    [place]  . Ambulatory referral to Rheumatology    Referral Priority:  Routine    Referral Type:  Consultation    Referral Reason:  Specialty Services Required    Requested Specialty:  Rheumatology    Number of Visits Requested:  1    Meds ordered  this encounter  Medications  . aspirin 81 MG tablet    Sig: Take 81 mg by mouth every other day.

## 2015-12-01 NOTE — Assessment & Plan Note (Signed)
S: complains of issues with memory over last year. Seems to be getting worse. 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  A/P: MMSE 26/30 today. We will trend at 6 month visit (3 off for serial 7s, 1 loss for ifs,ands, buts).

## 2015-12-01 NOTE — Patient Instructions (Signed)
Check in with me in 24months- schedule at front desk. Repeat memory testing at that visit.   Stop by lab on your way out  No change today except to try aspirin every other day  Really nice to meet you!

## 2015-12-01 NOTE — Assessment & Plan Note (Addendum)
S: LDL 79 last check. Not taking aspirin. Is on atorvastatin A/P: restart aspirin every other day- stopped due to easy bruising but continues to bruise anyway. LDL goal <100 reasonable consider nonobstructive CAD and on max dose statin

## 2015-12-01 NOTE — Assessment & Plan Note (Signed)
S: reasonable control on citalopram 20mg . Still struggling at times with loss of husband and daughter which is understandable A/P: in part of reversible causes of memory loss- will get PHQ9 next visit. Refilled meds.

## 2015-12-01 NOTE — Addendum Note (Signed)
Addended by: Clyde Lundborg A on: 12/01/2015 08:57 AM   Modules accepted: Orders

## 2015-12-31 ENCOUNTER — Ambulatory Visit
Admission: RE | Admit: 2015-12-31 | Discharge: 2015-12-31 | Disposition: A | Discharge status: Home / Self Care | Payer: Medicare HMO | Source: Ambulatory Visit

## 2015-12-31 DIAGNOSIS — Z1231 Encounter for screening mammogram for malignant neoplasm of breast: Secondary | ICD-10-CM

## 2016-01-28 DIAGNOSIS — M19271 Secondary osteoarthritis, right ankle and foot: Secondary | ICD-10-CM | POA: Diagnosis not present

## 2016-01-28 DIAGNOSIS — M170 Bilateral primary osteoarthritis of knee: Secondary | ICD-10-CM | POA: Diagnosis not present

## 2016-01-28 DIAGNOSIS — M19041 Primary osteoarthritis, right hand: Secondary | ICD-10-CM | POA: Diagnosis not present

## 2016-02-25 DIAGNOSIS — H52203 Unspecified astigmatism, bilateral: Secondary | ICD-10-CM | POA: Diagnosis not present

## 2016-05-31 ENCOUNTER — Ambulatory Visit: Payer: Self-pay | Admitting: Family Medicine

## 2016-07-13 ENCOUNTER — Ambulatory Visit (INDEPENDENT_AMBULATORY_CARE_PROVIDER_SITE_OTHER): Payer: Medicare HMO | Admitting: Family Medicine

## 2016-07-13 ENCOUNTER — Encounter: Payer: Self-pay | Admitting: Family Medicine

## 2016-07-13 VITALS — BP 136/82 | HR 87 | Temp 97.7°F | Ht 65.0 in | Wt 199.2 lb

## 2016-07-13 DIAGNOSIS — R05 Cough: Secondary | ICD-10-CM

## 2016-07-13 DIAGNOSIS — R059 Cough, unspecified: Secondary | ICD-10-CM

## 2016-07-13 DIAGNOSIS — F321 Major depressive disorder, single episode, moderate: Secondary | ICD-10-CM

## 2016-07-13 MED ORDER — ESCITALOPRAM OXALATE 20 MG PO TABS: 20.0000 mg | ORAL_TABLET | Freq: Every day | ORAL | Status: DC

## 2016-07-13 NOTE — Assessment & Plan Note (Addendum)
S: Patient previously reported well controlled depression. Today, mentioned has not slept well since husband and daughter died. For sleep has bene taking tylenol pm and melatonin but still could be up for 4-5 hours. Plan for last visit was phq9 due to potential reversible cause of dementia. PHQ9 of 14 today with 5 for first 2 questions and no SI/HI.  A/P:poorly controlled depression could contribute to memory loss- will change celexa to lexapro (20mg  each). Had considered increasing celexa to 40mg  but cannot with omeprazole. Follow up 4-6 weeks. Return/warning precautions given. Hold off on MMSE until depression improved.

## 2016-07-13 NOTE — Progress Notes (Addendum)
Subjective:  [name] is a 75 y.o. year old very pleasant female patient who presents for/with See problem oriented charting ROS- No chest pain or shortness of breath. No headache or blurry vision. No fever.see any ROS included in HPI as well.   Past Medical History-  Patient Active Problem List   Diagnosis Date Noted  . nonobstructive CAD (coronary artery disease) 12/01/2015    Priority: High  . Memory loss 12/01/2015    Priority: High  . 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 Hyperlipidemia 05/19/2007    Priority: Medium  . Depression 05/19/2007    Priority: Medium  . Essential hypertension 05/19/2007    Priority: Medium  . Insomnia 12/01/2015    Priority: Low  . Glaucoma 12/01/2015    Priority: Low  . Hammer toe     Priority: Low  . Arthritis 08/29/2012    Priority: Low  . GERD 09/06/2009    Priority: Low  . Edema 09/06/2009    Priority: Low  . History of syncope 09/06/2009    Priority: Low  . Hyperglycemia 05/19/2007    Priority: Low    Medications- reviewed and updated Current Outpatient Prescriptions  Medication Sig Dispense Refill  . aspirin 81 MG tablet Take 81 mg by mouth every other day.    Marland Kitchen atorvastatin (LIPITOR) 80 MG tablet Take 1 tablet (80 mg total) by mouth every evening. 30 tablet 11  . dextromethorphan-guaiFENesin (MUCINEX DM) 30-600 MG 12hr tablet Take 1 tablet by mouth 2 (two) times daily.    . Melatonin 3 MG CAPS Take 1 capsule by mouth at bedtime.    Marland Kitchen omeprazole (PRILOSEC) 20 MG capsule Take 1 capsule (20 mg total) by mouth daily. 30 capsule 11  . escitalopram (LEXAPRO) 20 MG tablet Take 1 tablet (20 mg total) by mouth daily. 30 tablet 5   No current facility-administered medications for this visit.    Objective: BP 136/82 mmHg  Pulse 87  Temp(Src) 97.7 F (36.5 C) (Oral)  Ht 5\' 5"  (1.651 m)  Wt 199 lb 3.2 oz (90.357 kg)  BMI 33.15 kg/m2  SpO2 94% Gen: NAD, resting comfortably Oropharynx normal, mild clear  discharge nose CV: RRR no murmurs rubs or gallops Lungs: CTAB no crackles, wheeze, rhonchi Abdomen: soft/nontender/nondistended/normal bowel sounds. No rebound or guarding.  Ext: no edema Skin: warm, dry Neuro: grossly normal, moves all extremities  Assessment/Plan:  Depression S: Patient previously reported well controlled depression. Today, mentioned has not slept well since husband and daughter died. For sleep has bene taking tylenol pm and melatonin but still could be up for 4-5 hours. Plan for last visit was phq9 due to potential reversible cause of dementia. PHQ9 of 14 today with 5 for first 2 questions and no SI/HI.  A/P:poorly controlled depression could contribute to memory loss- will change celexa to lexapro (20mg  each). Had considered increasing celexa to 40mg  but cannot with omeprazole. Follow up 4-6 weeks. Return/warning precautions given. Hold off on MMSE until depression improved.   Cough S:Started after trip to [place], [place]. Cough for a month, productive of brown mucus at times. . No shortness of breath except if ain coughing fits. No fever. No sinus pressure. Some wheezing. Taking some m\ucinex which helps some.  A/P: suspect bronchitis that is improving, in case allergic element consider flonase- she will consider. Consider CXR at follow up if persistent or sooner workup if worsening.   4-6 weeks  Meds ordered this encounter  Medications  . Melatonin  3 MG CAPS    Sig: Take 1 capsule by mouth at bedtime.  Marland Kitchen dextromethorphan-guaiFENesin (MUCINEX DM) 30-600 MG 12hr tablet    Sig: Take 1 tablet by mouth 2 (two) times daily.  Marland Kitchen escitalopram (LEXAPRO) 20 MG tablet    Sig: Take 1 tablet (20 mg total) by mouth daily.    Dispense:  30 tablet    Refill:  5   The duration of face-to-face time during this visit was 25 minutes. Greater than 50% of this time was spent in counseling, explanation of diagnosis, planning of further management, and/or coordination of care.   Return  precautions advised.  Garret Reddish, MD

## 2016-07-13 NOTE — Patient Instructions (Signed)
Your depression is not well controlled. Could be contributing to memory loss as well as sleep issues. Stop celexa, start lexapro 20mg  tomorrow. See me back in 4-6 weeks, we will check memory test if depression is better controlled.   Glad you had a great trip! We want to help you feel better at home though and not just when you are on a trip out of country.   Seems like you have bronchitis that is slowly improving. See me back if worsening symptoms- or we can do CXR at follow up. Could try some flonase to see if any allergic component but I think this is all viral    Taking the medicine as directed and not missing any doses is one of the best things you can do to treat your depression.  Here are some things to keep in mind:  1) Side effects (stomach upset, some increased anxiety) may happen before you notice a benefit.  These side effects typically go away over time. 2) Changes to your dose of medicine or a change in medication all together is sometimes necessary 3) Most people need to be on medication at least 6-12 months 4) Many people will notice an improvement within two weeks but the full effect of the medication can take up to 4-6 weeks 5) Stopping the medication when you start feeling better often results in a return of symptoms 6) If you start having thoughts of hurting yourself or others after starting this medicine, call our office immediately at [phone] or seek care through 911.

## 2016-07-13 NOTE — Progress Notes (Signed)
Pre visit review using our clinic review tool, if applicable. No additional management support is needed unless otherwise documented below in the visit note. 

## 2016-08-04 ENCOUNTER — Telehealth: Payer: Self-pay | Admitting: Family Medicine

## 2016-08-04 NOTE — Telephone Encounter (Signed)
° °  [facility] call to ask if they can give pt 90 day supply

## 2016-08-05 NOTE — Telephone Encounter (Signed)
Call placed to [facility]. They were asking oto do a 90 day refill for the atorvastatin. Ordered a 90 day supply.

## 2016-08-24 ENCOUNTER — Encounter: Payer: Self-pay | Admitting: Family Medicine

## 2016-08-24 ENCOUNTER — Ambulatory Visit (INDEPENDENT_AMBULATORY_CARE_PROVIDER_SITE_OTHER): Payer: Medicare HMO | Admitting: Family Medicine

## 2016-08-24 VITALS — BP 138/86 | HR 70 | Temp 98.1°F | Wt 188.4 lb

## 2016-08-24 DIAGNOSIS — F321 Major depressive disorder, single episode, moderate: Secondary | ICD-10-CM | POA: Diagnosis not present

## 2016-08-24 DIAGNOSIS — R69 Illness, unspecified: Secondary | ICD-10-CM | POA: Diagnosis not present

## 2016-08-24 DIAGNOSIS — Z23 Encounter for immunization: Secondary | ICD-10-CM | POA: Diagnosis not present

## 2016-08-24 MED ORDER — OMEPRAZOLE 20 MG PO CPDR: 20.0000 mg | DELAYED_RELEASE_CAPSULE | Freq: Every day | ORAL | 3 refills | Status: DC

## 2016-08-24 MED ORDER — TRAZODONE HCL 50 MG PO TABS: 25.0000 mg | ORAL_TABLET | Freq: Every evening | ORAL | 3 refills | Status: DC | PRN

## 2016-08-24 MED ORDER — ESCITALOPRAM OXALATE 20 MG PO TABS: 20.0000 mg | ORAL_TABLET | Freq: Every day | ORAL | 3 refills | Status: DC

## 2016-08-24 MED ORDER — ATORVASTATIN CALCIUM 80 MG PO TABS: 80.0000 mg | ORAL_TABLET | Freq: Every evening | ORAL | 3 refills | Status: DC

## 2016-08-24 NOTE — Addendum Note (Signed)
Addended by: Mariam Dollar, Roselyn Reef M on: 08/24/2016 10:40 AM   Modules accepted: Orders

## 2016-08-24 NOTE — Patient Instructions (Addendum)
prevnar 13 and high dose flu shot today  Try trazodone for sleep  Let's check in 2 months from now

## 2016-08-24 NOTE — Progress Notes (Signed)
Pre visit review using our clinic review tool, if applicable. No additional management support is needed unless otherwise documented below in the visit note. 

## 2016-08-24 NOTE — Progress Notes (Signed)
Subjective:  [name] is a 75 y.o. year old very pleasant female patient who presents for/with See problem oriented charting ROS- No Si/Hi. No chest pain or shortness of breath.see any ROS included in HPI as well.   Past Medical History-  Patient Active Problem List   Diagnosis Date Noted  . nonobstructive CAD (coronary artery disease) 12/01/2015    Priority: High  . Memory loss 12/01/2015    Priority: High  . 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 Hyperlipidemia 05/19/2007    Priority: Medium  . Moderate major depression ([place]) 05/19/2007    Priority: Medium  . Essential hypertension 05/19/2007    Priority: Medium  . Insomnia 12/01/2015    Priority: Low  . Glaucoma 12/01/2015    Priority: Low  . Hammer toe     Priority: Low  . Arthritis 08/29/2012    Priority: Low  . GERD 09/06/2009    Priority: Low  . Edema 09/06/2009    Priority: Low  . History of syncope 09/06/2009    Priority: Low  . Hyperglycemia 05/19/2007    Priority: Low    Medications- reviewed and updated Current Outpatient Prescriptions  Medication Sig Dispense Refill  . aspirin 81 MG tablet Take 81 mg by mouth every other day.    Marland Kitchen atorvastatin (LIPITOR) 80 MG tablet Take 1 tablet (80 mg total) by mouth every evening. 90 tablet 3  . dextromethorphan-guaiFENesin (MUCINEX DM) 30-600 MG 12hr tablet Take 1 tablet by mouth 2 (two) times daily.    Marland Kitchen escitalopram (LEXAPRO) 20 MG tablet Take 1 tablet (20 mg total) by mouth daily. 90 tablet 3  . omeprazole (PRILOSEC) 20 MG capsule Take 1 capsule (20 mg total) by mouth daily. 90 capsule 3   No current facility-administered medications for this visit.     Objective: BP 138/86   Pulse 70   Temp 98.1 F (36.7 C) (Oral)   Wt 188 lb 6.4 oz (85.5 kg)   SpO2 97%   BMI 31.35 kg/m  Gen: NAD, resting comfortably CV: RRR no murmurs rubs or gallops Lungs: CTAB no crackles, wheeze, rhonchi Skin: warm, dry Neuro: grossly normal, moves all  extremities, normal gait   Assessment/Plan:  Flonase helped for cough- denies issues at present.    Moderate major depression ([place]) Insomnia S: last visit changed celexa to lexapro 20mg . PHQ9 of 14 last visit (5 for questions 1 and 2). Had not been sleeping well since loss of husband and daughter. Had tried melatonin and tylenol prn. Patient at this point states she feels much better but phq9 only down to 11 (though questions 1 and 2 down to a 2). She complains less of memoryissues and in particular if she sleeps better- she really does not note issues.  A/P: score not as improved as patient reports she feels. She is out of house more- walks dog in AM, goes to gym 6 days a week. Has looked into workingat [employer] for samples and applied for another job- enjoys being around people- hard just to be at home alone still but overall feels much better. For sleep issues-will trial trazodone 25-50mg   2 months  Meds ordered this encounter  Medications  . atorvastatin (LIPITOR) 80 MG tablet    Sig: Take 1 tablet (80 mg total) by mouth every evening.    Dispense:  90 tablet    Refill:  3  . escitalopram (LEXAPRO) 20 MG tablet    Sig: Take 1 tablet (20 mg total)  by mouth daily.    Dispense:  90 tablet    Refill:  3  . omeprazole (PRILOSEC) 20 MG capsule    Sig: Take 1 capsule (20 mg total) by mouth daily.    Dispense:  90 capsule    Refill:  3  . traZODone (DESYREL) 50 MG tablet    Sig: Take 0.5-1 tablets (25-50 mg total) by mouth at bedtime as needed for sleep.    Dispense:  30 tablet    Refill:  3   The duration of face-to-face time during this visit was 15 minutes. Greater than 50% of this time was spent in counseling, explanation of diagnosis, planning of further management, and/or coordination of care.   Return precautions advised.  Garret Reddish, MD

## 2016-08-24 NOTE — Assessment & Plan Note (Signed)
Insomnia S: last visit changed celexa to lexapro 20mg . PHQ9 of 14 last visit (5 for questions 1 and 2). Had not been sleeping well since loss of husband and daughter. Had tried melatonin and tylenol prn. Patient at this point states she feels much better but phq9 only down to 11 (though questions 1 and 2 down to a 2). She complains less of memoryissues and in particular if she sleeps better- she really does not note issues.  A/P: score not as improved as patient reports she feels. She is out of house more- walks dog in AM, goes to gym 6 days a week. Has looked into workingat [employer] for samples and applied for another job- enjoys being around people- hard just to be at home alone still but overall feels much better. For sleep issues-will trial trazodone 25-50mg 

## 2016-10-25 ENCOUNTER — Ambulatory Visit: Payer: Medicare HMO | Admitting: Family Medicine

## 2016-10-26 ENCOUNTER — Other Ambulatory Visit: Payer: Self-pay

## 2016-10-26 ENCOUNTER — Ambulatory Visit (INDEPENDENT_AMBULATORY_CARE_PROVIDER_SITE_OTHER): Payer: Medicare HMO | Admitting: Family Medicine

## 2016-10-26 ENCOUNTER — Telehealth: Payer: Self-pay

## 2016-10-26 ENCOUNTER — Encounter: Payer: Self-pay | Admitting: Family Medicine

## 2016-10-26 VITALS — BP 142/86 | HR 66 | Wt 198.4 lb

## 2016-10-26 DIAGNOSIS — E785 Hyperlipidemia, unspecified: Secondary | ICD-10-CM | POA: Diagnosis not present

## 2016-10-26 DIAGNOSIS — R69 Illness, unspecified: Secondary | ICD-10-CM | POA: Diagnosis not present

## 2016-10-26 DIAGNOSIS — I10 Essential (primary) hypertension: Secondary | ICD-10-CM | POA: Diagnosis not present

## 2016-10-26 DIAGNOSIS — F321 Major depressive disorder, single episode, moderate: Secondary | ICD-10-CM

## 2016-10-26 DIAGNOSIS — Z23 Encounter for immunization: Secondary | ICD-10-CM | POA: Diagnosis not present

## 2016-10-26 DIAGNOSIS — S80811A Abrasion, right lower leg, initial encounter: Secondary | ICD-10-CM

## 2016-10-26 LAB — CBC
HCT: 40.2 % (ref 36.0–46.0)
Hemoglobin: 13.9 g/dL (ref 12.0–15.0)
MCHC: 34.6 g/dL (ref 30.0–36.0)
MCV: 86.5 fl (ref 78.0–100.0)
PLATELETS: 234 10*3/uL (ref 150.0–400.0)
RBC: 4.65 Mil/uL (ref 3.87–5.11)
RDW: 14 % (ref 11.5–15.5)
WBC: 5 10*3/uL (ref 4.0–10.5)

## 2016-10-26 LAB — LIPID PANEL
CHOL/HDL RATIO: 3
Cholesterol: 153 mg/dL (ref 0–200)
HDL: 56.2 mg/dL (ref >39.00)
LDL CALC: 78 mg/dL (ref 0–99)
NonHDL: 96.79
Triglycerides: 92 mg/dL (ref 0.0–149.0)
VLDL: 18.4 mg/dL (ref 0.0–40.0)

## 2016-10-26 LAB — COMPREHENSIVE METABOLIC PANEL
ALBUMIN: 4.1 g/dL (ref 3.5–5.2)
ALT: 17 U/L (ref 0–35)
AST: 21 U/L (ref 0–37)
Alkaline Phosphatase: 67 U/L (ref 39–117)
BILIRUBIN TOTAL: 0.9 mg/dL (ref 0.2–1.2)
BUN: 27 mg/dL — AB (ref 6–23)
CO2: 29 meq/L (ref 19–32)
CREATININE: 0.71 mg/dL (ref 0.40–1.20)
Calcium: 9.5 mg/dL (ref 8.4–10.5)
Chloride: 104 mEq/L (ref 96–112)
GFR: 85.35 mL/min (ref >60.00)
Glucose, Bld: 92 mg/dL (ref 70–99)
Potassium: 4.6 mEq/L (ref 3.5–5.1)
SODIUM: 141 meq/L (ref 135–145)
Total Protein: 6.4 g/dL (ref 6.0–8.3)

## 2016-10-26 MED ORDER — DOXEPIN HCL 3 MG PO TABS: 3.0000 mg | ORAL_TABLET | Freq: Every evening | ORAL | 3 refills | Status: DC | PRN

## 2016-10-26 NOTE — Assessment & Plan Note (Signed)
S: pressure trending up despite improved exercise patterns. No rx BP Readings from Last 3 Encounters:  10/26/16 (!) 142/86  08/24/16 138/86  07/13/16 136/82  A/P: follow up in 3 months- if continues to trend up will need to consider medication again

## 2016-10-26 NOTE — Telephone Encounter (Signed)
Received PA request from [facility] for Silenor 3 mg. PA submitted & is pending. Key: [id number]

## 2016-10-26 NOTE — Progress Notes (Signed)
Subjective:  [name] is a 75 y.o. year old very pleasant female patient who presents for/with See problem oriented charting ROS- No chest pain or shortness of breath. No headache or blurry vision.  Did get a cut on her right leg- she is not sure exactly when it happened.see any ROS included in HPI as well.   Past Medical History-  Patient Active Problem List   Diagnosis Date Noted  . nonobstructive CAD (coronary artery disease) 12/01/2015    Priority: High  . Memory loss 12/01/2015    Priority: High  . 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 Hyperlipidemia 05/19/2007    Priority: Medium  . 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 Essential hypertension 05/19/2007    Priority: Medium  . Insomnia 12/01/2015    Priority: Low  . Glaucoma 12/01/2015    Priority: Low  . Hammer toe     Priority: Low  . Arthritis 08/29/2012    Priority: Low  . GERD 09/06/2009    Priority: Low  . Edema 09/06/2009    Priority: Low  . History of syncope 09/06/2009    Priority: Low  . Hyperglycemia 05/19/2007    Priority: Low    Medications- reviewed and updated Current Outpatient Prescriptions  Medication Sig Dispense Refill  . aspirin 81 MG tablet Take 81 mg by mouth every other day.    Marland Kitchen atorvastatin (LIPITOR) 80 MG tablet Take 1 tablet (80 mg total) by mouth every evening. 90 tablet 3  . dextromethorphan-guaiFENesin (MUCINEX DM) 30-600 MG 12hr tablet Take 1 tablet by mouth 2 (two) times daily.    Marland Kitchen escitalopram (LEXAPRO) 20 MG tablet Take 1 tablet (20 mg total) by mouth daily. 90 tablet 3  . omeprazole (PRILOSEC) 20 MG capsule Take 1 capsule (20 mg total) by mouth daily. 90 capsule 3  . Doxepin HCl 3 MG TABS Take 1-2 tablets (3-6 mg total) by mouth at bedtime as needed. 30 tablet 3   No current facility-administered medications for this visit.     Objective: BP (!) 142/86 (BP Location: Left Arm, Patient Position: Sitting, Cuff Size: Normal)    Pulse 66   Wt 198 lb 6.4 oz (90 kg)   SpO2 98%   BMI 33.02 kg/m  Gen: NAD, resting comfortably CV: RRR no murmurs rubs or gallops Lungs: CTAB no crackles, wheeze, rhonchi Ext: trace edema, no surrounding erythema but does have an abrasion on right lower leg on shin that is not actively bleeding.   Assessment/Plan:  Hyperlipidemia S: well controlled on atorvastatin 80mg . No myalgias.  Lab Results  Component Value Date   CHOL 165 01/03/2015   HDL 69 01/03/2015   LDLCALC 79 01/03/2015   LDLDIRECT 74.0 12/01/2015   TRIG 74 01/03/2015   CHOLHDL 3.4 CALC 08/01/2008   A/P: update lipids today   Essential hypertension S: pressure trending up despite improved exercise patterns. No rx BP Readings from Last 3 Encounters:  10/26/16 (!) 142/86  08/24/16 138/86  07/13/16 136/82  A/P: follow up in 3 months- if continues to trend up will need to consider medication again   [name] S: At last visit we kept patient on lexapro 20mg  (prior change from celexa) but had PHQ9 of 14- she felt in a much better place though and stated felt better than score indicated. She was out walking her dog which was new for her, doing gym 6 days a week. Had looked into getting  a job.   Today reports, She is doing gym now 6-7 days a week. Walking the day. Has not found a job yet. PHQ9 down to 7 with score of 3 for poor sleep. NO SI/HI. 1 each for #1 and #2.   Tylenol prn and melatonin trialed in the past. We trialed trazodone last visit- she felt very hyper on this- cleaned a lot, talked a lot- stopped after  3days  Has noted when away from home and sleeping with others no issues sleeping. Has not slept well since husband died- wonders if it is being a lone. She is going to try a noise machine A/P: continue lexapro 20mg . Trial sleep machine- if ineffective- may trial doxepin low dose.    Abrasion of right leg, initial encounter S: not sure when this occurred but sometime in last day.  Does not remember hitting it but then had abrasion on right lower leg A/P: update Td today  3 months.   Orders Placed This Encounter  Procedures  . Td : Tetanus/diphtheria >7yo Preservative  free  . Lipid panel    [place]    Order Specific Question:   Has the patient fasted?    Answer:   No  . CBC    [place]  . Comprehensive metabolic panel    [place]    Order Specific Question:   Has the patient fasted?    Answer:   No    Meds ordered this encounter  Medications  . Doxepin HCl 3 MG TABS    Sig: Take 1-2 tablets (3-6 mg total) by mouth at bedtime as needed.    Dispense:  30 tablet    Refill:  3    Return precautions advised.  Garret Reddish, MD

## 2016-10-26 NOTE — Assessment & Plan Note (Signed)
S: At last visit we kept patient on lexapro 20mg  (prior change from celexa) but had PHQ9 of 14- she felt in a much better place though and stated felt better than score indicated. She was out walking her dog which was new for her, doing gym 6 days a week. Had looked into getting a job.   Today reports, She is doing gym now 6-7 days a week. Walking the day. Has not found a job yet. PHQ9 down to 7 with score of 3 for poor sleep. NO SI/HI. 1 each for #1 and #2.   Tylenol prn and melatonin trialed in the past. We trialed trazodone last visit- she felt very hyper on this- cleaned a lot, talked a lot- stopped after  3days  Has noted when away from home and sleeping with others no issues sleeping. Has not slept well since husband died- wonders if it is being a lone. She is going to try a noise machine A/P: continue lexapro 20mg . Trial sleep machine- if ineffective- may trial doxepin low dose.

## 2016-10-26 NOTE — Telephone Encounter (Signed)
Insurance will cover 30 tablets per 30 days. Spoke with Dr. Yong Channel, and new rx for 30 tabs for 30 days sent.

## 2016-10-26 NOTE — Assessment & Plan Note (Signed)
S: well controlled on atorvastatin 80mg . No myalgias.  Lab Results  Component Value Date   CHOL 165 01/03/2015   HDL 69 01/03/2015   LDLCALC 79 01/03/2015   LDLDIRECT 74.0 12/01/2015   TRIG 74 01/03/2015   CHOLHDL 3.4 CALC 08/01/2008   A/P: update lipids today

## 2016-10-26 NOTE — Progress Notes (Signed)
Pre visit review using our clinic review tool, if applicable. No additional management support is needed unless otherwise documented below in the visit note. 

## 2016-10-26 NOTE — Patient Instructions (Addendum)
Td (no Tdap) today for abrasion on right leg  Follow up in 3 months  Call us immediately if any thoughts of hurting yourself  Trial doxepin 3mg  for sleep- can use 2 pills if no side effects on 3 mg but not sleeping  Blood pressure up slightly- may need medication if continues to be high next visit but suspect will be better

## 2016-11-19 ENCOUNTER — Encounter: Payer: Self-pay | Admitting: Family Medicine

## 2016-12-09 ENCOUNTER — Other Ambulatory Visit: Payer: Self-pay | Admitting: Family Medicine

## 2016-12-09 DIAGNOSIS — Z1231 Encounter for screening mammogram for malignant neoplasm of breast: Secondary | ICD-10-CM

## 2017-01-26 ENCOUNTER — Ambulatory Visit
Admission: RE | Admit: 2017-01-26 | Discharge: 2017-01-26 | Disposition: A | Discharge status: Home / Self Care | Payer: Medicare HMO | Source: Ambulatory Visit | Attending: Family Medicine | Admitting: Family Medicine

## 2017-01-26 ENCOUNTER — Encounter: Payer: Self-pay | Admitting: Family Medicine

## 2017-01-26 ENCOUNTER — Ambulatory Visit (INDEPENDENT_AMBULATORY_CARE_PROVIDER_SITE_OTHER): Payer: Medicare HMO | Admitting: Family Medicine

## 2017-01-26 VITALS — BP 138/84 | HR 72 | Temp 97.7°F | Ht 65.0 in | Wt 202.0 lb

## 2017-01-26 DIAGNOSIS — I10 Essential (primary) hypertension: Secondary | ICD-10-CM | POA: Diagnosis not present

## 2017-01-26 DIAGNOSIS — Z7189 Other specified counseling: Secondary | ICD-10-CM | POA: Insufficient documentation

## 2017-01-26 DIAGNOSIS — F321 Major depressive disorder, single episode, moderate: Secondary | ICD-10-CM | POA: Diagnosis not present

## 2017-01-26 DIAGNOSIS — E785 Hyperlipidemia, unspecified: Secondary | ICD-10-CM

## 2017-01-26 DIAGNOSIS — R69 Illness, unspecified: Secondary | ICD-10-CM | POA: Diagnosis not present

## 2017-01-26 DIAGNOSIS — Z1231 Encounter for screening mammogram for malignant neoplasm of breast: Secondary | ICD-10-CM

## 2017-01-26 LAB — TSH: TSH: 4.12 u[IU]/mL (ref 0.35–4.50)

## 2017-01-26 NOTE — Assessment & Plan Note (Signed)
Has old paperwork with intentions if she becomes ill. Full code but would not want prolonged intubation or to be in vegetative steate- plans to bring by old paperwork

## 2017-01-26 NOTE — Progress Notes (Signed)
Subjective:  RODA TRENTMAN is a 76 y.o. year old very pleasant female patient who presents for/with See problem oriented charting ROS- No SI/HI. No depressed mood. No chest pain or shortness of breath.    Past Medical History-  Patient Active Problem List   Diagnosis Date Noted  . nonobstructive CAD (coronary artery disease) 12/01/2015    Priority: High  . Memory loss 12/01/2015    Priority: High  . 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 Hyperlipidemia 05/19/2007    Priority: Medium  . 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 Essential hypertension 05/19/2007    Priority: Medium  . Insomnia 12/01/2015    Priority: Low  . Glaucoma 12/01/2015    Priority: Low  . Hammer toe     Priority: Low  . Arthritis 08/29/2012    Priority: Low  . GERD 09/06/2009    Priority: Low  . Edema 09/06/2009    Priority: Low  . History of syncope 09/06/2009    Priority: Low  . Hyperglycemia 05/19/2007    Priority: Low  . Advanced directives, counseling/discussion 01/26/2017    Medications- reviewed and updated Current Outpatient Prescriptions  Medication Sig Dispense Refill  . aspirin 81 MG tablet Take 81 mg by mouth every other day.    Marland Kitchen atorvastatin (LIPITOR) 80 MG tablet Take 1 tablet (80 mg total) by mouth every evening. 90 tablet 3  . escitalopram (LEXAPRO) 20 MG tablet Take 1 tablet (20 mg total) by mouth daily. 90 tablet 3  . omeprazole (PRILOSEC) 20 MG capsule Take 1 capsule (20 mg total) by mouth daily. 90 capsule 3   No current facility-administered medications for this visit.     Objective: BP 138/84 (BP Location: Left Arm, Patient Position: Sitting, Cuff Size: Large)   Pulse 72   Temp 97.7 F (36.5 C) (Oral)   Ht 5\' 5"  (1.651 m)   Wt 202 lb (91.6 kg)   SpO2 95%   BMI 33.61 kg/m  Gen: NAD, resting comfortably, cheerful CV: RRR no murmurs rubs or gallops Lungs: CTAB no crackles, wheeze, rhonchi  Ext: no edema Skin: warm,  dry Neuro: grossly normal, moves all extremities  Assessment/Plan:  Essential hypertension S: controlled on no medication- improved from last visit..  BP Readings from Last 3 Encounters:  01/26/17 138/84  10/26/16 (!) 142/86  08/24/16 138/86  A/P:Continue without medication  Moderate major depression ([place]) S: PHQ9 had been as high as 14 but last visit down 2 7 but 3 for poor sleep. We had previously tried trazodone, tylenol pm, melatonin and no help with sleep (see prior SE noted). She was also to try a sleep machine/noise machine- not sure if this was done.  TOday states she feels like "a cloud has lifted".Been going to [employer] 5 days a week- has dropped 2 sizes. Has found doing some charitable things over holidays also helped. She reports sleep has been better with this routine. Now sleeping after about an hour instead of 3-4 hours staying up in bed.  Depression screen [facility] 2/9 01/26/2017 12/01/2015  Decreased Interest 0 0  Down, Depressed, Hopeless 0 0  PHQ - 2 Score 0 0  A/P: much improved. Continue lexapro alone. Keep doxepin as option for future if needed.   Hyperlipidemia S: well controlled on atorvastatin 80mg . No myalgias.  Lab Results  Component Value Date   CHOL 153 10/26/2016   HDL 56.20 10/26/2016   LDLCALC 78 10/26/2016  LDLDIRECT 74.0 12/01/2015   TRIG 92.0 10/26/2016   CHOLHDL 3 10/26/2016   A/P: reasonable LDL goal at <100, no changes. Daughters both have had thyroid issues and she would really like to make sure she does not have issue- check tsh  Advanced directives, counseling/discussion Has old paperwork with intentions if she becomes ill. Full code but would not want prolonged intubation or to be in vegetative steate- plans to bring by old paperwork  She was trying to avoid stepping on her new grass recently and had a fall and fell onto hip. Mild pain at this point- 3 weeks ago. No issues with gait. Fall prevention discussed  Physical in 6 months  verbal  Orders Placed This Encounter  Procedures  . TSH    [place]   Return precautions advised.  Garret Reddish, MD

## 2017-01-26 NOTE — Assessment & Plan Note (Signed)
S: PHQ9 had been as high as 14 but last visit down 2 7 but 3 for poor sleep. We had previously tried trazodone, tylenol pm, melatonin and no help with sleep (see prior SE noted). She was also to try a sleep machine/noise machine- not sure if this was done.  TOday states she feels like "a cloud has lifted".Been going to [employer] 5 days a week- has dropped 2 sizes. Has found doing some charitable things over holidays also helped. She reports sleep has been better with this routine. Now sleeping after about an hour instead of 3-4 hours staying up in bed.  Depression screen [facility] 2/9 01/26/2017 12/01/2015  Decreased Interest 0 0  Down, Depressed, Hopeless 0 0  PHQ - 2 Score 0 0  A/P: much improved. Continue lexapro alone. Keep doxepin as option for future if needed.

## 2017-01-26 NOTE — Assessment & Plan Note (Signed)
S: controlled on no medication- improved from last visit..  BP Readings from Last 3 Encounters:  01/26/17 138/84  10/26/16 (!) 142/86  08/24/16 138/86  A/P:Continue without medication

## 2017-01-26 NOTE — Patient Instructions (Addendum)
Thyroid test before you leave  No changes to medicine- you are doing great!   Let us know if you do not continue to feel as well as you are from depression perspective.   Be careful with those steps!    Fall Prevention in the Home Introduction Falls can cause injuries. They can happen to people of all ages. There are many things you can do to make your home safe and to help prevent falls. What can I do on the outside of my home?  Regularly fix the edges of walkways and driveways and fix any cracks.  Remove anything that might make you trip as you walk through a door, such as a raised step or threshold.  Trim any bushes or trees on the path to your home.  Use bright outdoor lighting.  Clear any walking paths of anything that might make someone trip, such as rocks or tools.  Regularly check to see if handrails are loose or broken. Make sure that both sides of any steps have handrails.  Any raised decks and porches should have guardrails on the edges.  Have any leaves, snow, or ice cleared regularly.  Use sand or salt on walking paths during winter.  Clean up any spills in your garage right away. This includes oil or grease spills. What can I do in the bathroom?  Use night lights.  Install grab bars by the toilet and in the tub and shower. Do not use towel bars as grab bars.  Use non-skid mats or decals in the tub or shower.  If you need to sit down in the shower, use a plastic, non-slip stool.  Keep the floor dry. Clean up any water that spills on the floor as soon as it happens.  Remove soap buildup in the tub or shower regularly.  Attach bath mats securely with double-sided non-slip rug tape.  Do not have throw rugs and other things on the floor that can make you trip. What can I do in the bedroom?  Use night lights.  Make sure that you have a light by your bed that is easy to reach.  Do not use any sheets or blankets that are too big for your bed. They should  not hang down onto the floor.  Have a firm chair that has side arms. You can use this for support while you get dressed.  Do not have throw rugs and other things on the floor that can make you trip. What can I do in the kitchen?  Clean up any spills right away.  Avoid walking on wet floors.  Keep items that you use a lot in easy-to-reach places.  If you need to reach something above you, use a strong step stool that has a grab bar.  Keep electrical cords out of the way.  Do not use floor polish or wax that makes floors slippery. If you must use wax, use non-skid floor wax.  Do not have throw rugs and other things on the floor that can make you trip. What can I do with my stairs?  Do not leave any items on the stairs.  Make sure that there are handrails on both sides of the stairs and use them. Fix handrails that are broken or loose. Make sure that handrails are as long as the stairways.  Check any carpeting to make sure that it is firmly attached to the stairs. Fix any carpet that is loose or worn.  Avoid having throw rugs at  the top or bottom of the stairs. If you do have throw rugs, attach them to the floor with carpet tape.  Make sure that you have a light switch at the top of the stairs and the bottom of the stairs. If you do not have them, ask someone to add them for you. What else can I do to help prevent falls?  Wear shoes that:  Do not have high heels.  Have rubber bottoms.  Are comfortable and fit you well.  Are closed at the toe. Do not wear sandals.  If you use a stepladder:  Make sure that it is fully opened. Do not climb a closed stepladder.  Make sure that both sides of the stepladder are locked into place.  Ask someone to hold it for you, if possible.  Clearly mark and make sure that you can see:  Any grab bars or handrails.  First and last steps.  Where the edge of each step is.  Use tools that help you move around (mobility aids) if they are  needed. These include:  Canes.  Walkers.  Scooters.  Crutches.  Turn on the lights when you go into a dark area. Replace any light bulbs as soon as they burn out.  Set up your furniture so you have a clear path. Avoid moving your furniture around.  If any of your floors are uneven, fix them.  If there are any pets around you, be aware of where they are.  Review your medicines with your doctor. Some medicines can make you feel dizzy. This can increase your chance of falling. Ask your doctor what other things that you can do to help prevent falls. This information is not intended to replace advice given to you by your health care provider. Make sure you discuss any questions you have with your health care provider. Document Released: 10/09/2009 Document Revised: 05/20/2016 Document Reviewed: 01/17/2015  2017 Elsevier

## 2017-01-26 NOTE — Progress Notes (Signed)
Pre visit review using our clinic review tool, if applicable. No additional management support is needed unless otherwise documented below in the visit note. 

## 2017-01-26 NOTE — Assessment & Plan Note (Signed)
S: well controlled on atorvastatin 80mg . No myalgias.  Lab Results  Component Value Date   CHOL 153 10/26/2016   HDL 56.20 10/26/2016   LDLCALC 78 10/26/2016   LDLDIRECT 74.0 12/01/2015   TRIG 92.0 10/26/2016   CHOLHDL 3 10/26/2016   A/P: reasonable LDL goal at <100, no changes. Daughters both have had thyroid issues and she would really like to make sure she does not have issue- check tsh

## 2017-02-14 DIAGNOSIS — S61209A Unspecified open wound of unspecified finger without damage to nail, initial encounter: Secondary | ICD-10-CM | POA: Diagnosis not present

## 2017-02-14 DIAGNOSIS — M79645 Pain in left finger(s): Secondary | ICD-10-CM | POA: Diagnosis not present

## 2017-03-01 DIAGNOSIS — H52203 Unspecified astigmatism, bilateral: Secondary | ICD-10-CM | POA: Diagnosis not present

## 2017-03-01 DIAGNOSIS — Z961 Presence of intraocular lens: Secondary | ICD-10-CM | POA: Diagnosis not present

## 2017-03-01 DIAGNOSIS — R69 Illness, unspecified: Secondary | ICD-10-CM | POA: Diagnosis not present

## 2017-03-09 DIAGNOSIS — M19072 Primary osteoarthritis, left ankle and foot: Secondary | ICD-10-CM

## 2017-03-09 DIAGNOSIS — M19042 Primary osteoarthritis, left hand: Principal | ICD-10-CM

## 2017-03-09 DIAGNOSIS — M19071 Primary osteoarthritis, right ankle and foot: Secondary | ICD-10-CM | POA: Insufficient documentation

## 2017-03-09 DIAGNOSIS — R768 Other specified abnormal immunological findings in serum: Secondary | ICD-10-CM | POA: Insufficient documentation

## 2017-03-09 DIAGNOSIS — M19041 Primary osteoarthritis, right hand: Secondary | ICD-10-CM | POA: Insufficient documentation

## 2017-03-09 NOTE — Progress Notes (Signed)
Office Visit Note  Patient: [name]             Date of Birth: 1941-07-04           MRN: [id number]             PCP: Garret Reddish, MD Referring: Marin Olp, MD Visit Date: 03/15/2017 Occupation: @GUAROCC @    Subjective:  Pain in right hand and right hip.   History of Present Illness: [name] is a 76 y.o. female with history of osteoarthritis. According to patient she has a knot on her right middle finger which is been painful. She's been also having discomfort in her right thumb base. She describes pain and discomfort in the right trochanteric area which is very painful and it radiates down her leg. She also hurts to sleep on the right side. Her hands are very stiff in the morning.   Activities of Daily Living:  Patient reports morning stiffness for 1 hour.   Patient Reports nocturnal pain.  Difficulty dressing/grooming: Denies Difficulty climbing stairs: Reports Difficulty getting out of chair: Denies Difficulty using hands for taps, buttons, cutlery, and/or writing: Reports   Review of Systems  Constitutional: Positive for fatigue. Negative for night sweats, weight gain, weight loss and weakness.  HENT: Positive for mouth dryness. Negative for mouth sores, trouble swallowing, trouble swallowing and nose dryness.   Eyes: Negative for pain, redness, visual disturbance and dryness.  Respiratory: Negative for cough, shortness of breath and difficulty breathing.   Cardiovascular: Negative for chest pain, palpitations, hypertension, irregular heartbeat and swelling in legs/feet.  Gastrointestinal: Negative for blood in stool, constipation and diarrhea.  Endocrine: Negative for increased urination.  Genitourinary: Negative for vaginal dryness.  Musculoskeletal: Positive for arthralgias, joint pain and morning stiffness. Negative for joint swelling, myalgias, muscle weakness, muscle tenderness and myalgias.  Skin: Negative for color change, rash, hair  loss, skin tightness, ulcers and sensitivity to sunlight.  Allergic/Immunologic: Negative for susceptible to infections.  Neurological: Negative for dizziness, memory loss and night sweats.  Hematological: Negative for swollen glands.  Psychiatric/Behavioral: Positive for depressed mood and sleep disturbance. The patient is not nervous/anxious.        Situational due to loss oh her husband and daughter    PMFS History:  Patient Active Problem List   Diagnosis Date Noted  . Primary osteoarthritis of both knees 03/10/2017  . Primary osteoarthritis of both hands 03/09/2017  . Primary osteoarthritis of both feet 03/09/2017  . Rheumatoid factor positive 03/09/2017  . Advanced directives, counseling/discussion 01/26/2017  . nonobstructive CAD (coronary artery disease) 12/01/2015  . Varicose veins 12/01/2015  . Insomnia 12/01/2015  . Glaucoma 12/01/2015  . Memory loss 12/01/2015  . Hammer toe   . Arthritis 08/29/2012  . GERD 09/06/2009  . Edema 09/06/2009  . History of syncope 09/06/2009  . Hyperlipidemia 05/19/2007  . Moderate major depression ([place]) 05/19/2007  . Essential hypertension 05/19/2007  . Hyperglycemia 05/19/2007    Past Medical History:  Diagnosis Date  . Anginal pain ([place]) 08/29/2012   "have had problems w/this"  . Anxiety 08/29/2012  . Arthritis 08/29/2012   right hand, Seen by Dr. Estanislado Pandy  . CAD (coronary artery disease)    nonobstructive  . Depression   . Exertional dyspnea 08/29/2012  . GERD (gastroesophageal reflux disease)   . Hammer toe    surgery x2, second with Dr. Sharol Given- only other option amputatoin  . HLD (hyperlipidemia)   . HTN (hypertension)   . PNEUMONIA  07/04/2007   Qualifier: Diagnosis of  By: Larose Kells MD, [place] Syncope and collapse 08/29/2012   "first time ever"    Family History  Problem Relation Age of Onset  . Heart disease Mother     brother, father  . Diabetes Mother     sister, brother  . Suicidality Father     22  . Coronary artery  disease Brother     father  . Alcoholism Brother   . Ovarian cancer Daughter    Past Surgical History:  Procedure Laterality Date  . ABDOMINAL HYSTERECTOMY  1970's  . APPENDECTOMY  ~ 1970  . CATARACT EXTRACTION W/ INTRAOCULAR LENS  IMPLANT, BILATERAL  ~ 03-27-09   bilateral  . TONSILLECTOMY AND ADENOIDECTOMY  1950's  . VEIN LIGATION AND STRIPPING  28-Mar-2011   bilaterally   Social History   Social History Narrative   Widowed 10-27-13. Daughter died 03-27-2012. 2 living children. 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 She appears well-developed and well-nourished.  HENT:  Head: Normocephalic and atraumatic.  Eyes: Conjunctivae and EOM are normal.  Neck: Normal range of motion.  Cardiovascular: Normal rate, regular rhythm, normal heart sounds and intact distal pulses.   Pulmonary/Chest: Effort normal and breath sounds normal.  Abdominal: Soft. Bowel sounds are normal.  Lymphadenopathy:    She has no cervical adenopathy.  Neurological: She is alert and oriented to person, place, and time.  Skin: Skin is warm and dry. Capillary refill takes less than 2 seconds.  Psychiatric: She has a normal mood and affect. Her behavior is normal.  Nursing note and vitals reviewed.    Musculoskeletal Exam: C-spine and thoracic lumbar spine good range of motion. Shoulder joints elbow joints wrist joints good range of motion. She is tenderness over right CMC joint. Bilateral PIP/DIP thickening was noted. She had discomfort on palpation of her right trochanteric bursa. She had no discomfort with range of motion of her  hip joint. Knee joints ankles MTPs PIPs with good range of motion with no synovitis.  CDAI Exam: No CDAI exam completed.    Investigation: Findings:  10-27-2014:  Her uric acid was 3.8, March 28, 2011 her uric acid was 5.5, rheumatoid factor was 19, CCP was negative, ANA was negative     28-Mar-2011:  Complete blood count, comprehensive metabolic panel, sedimentation rate, uric acid, anti-CCP, and ANA were negative.  Rheumatoid factor was borderline positive at 19.      Imaging: No results found.  Speciality Comments: No specialty comments available.    Procedures:  Large Joint Inj Date/Time: 03/15/2017 11:37 AM Performed by: Bo Merino Authorized by: Bo Merino   Consent Given by:  Patient Site marked: the procedure site was marked   Timeout: prior to procedure the correct patient, procedure, and site was verified   Indications:  Pain Location:  Hip Site:  R greater trochanter Prep: patient was prepped and draped in usual sterile fashion  Needle Size:  27 G Needle Length:  1.5 inches Approach:  Lateral Ultrasound Guidance: No   Fluoroscopic Guidance: No   Arthrogram: No   Medications:  40 mg triamcinolone acetonide 40 MG/ML; 1.5 mL lidocaine 1 % Aspiration Attempted: No   Aspirate amount (mL):  0 Patient tolerance:  Patient tolerated the procedure well with no immediate complications Small Joint Inj Date/Time: 03/15/2017 11:38 AM Performed by: Bo Merino Authorized by: Bo Merino   Consent Given by:  Patient Site marked: the procedure site was marked   Timeout: prior to procedure the correct patient, procedure, and site was verified   Indications:  Pain Location:  Thumb Site:  R thumb CMC Prep: patient was prepped and draped in usual sterile fashion   Needle Size:  27 G Spinal Needle: No   Approach:  Radial Ultrasound Guided: No   Fluoroscopic Guidance: No   Medications:  0.5 mL lidocaine 1 %; 10 mg triamcinolone acetonide 40  MG/ML Aspiration Attempted: Yes   Aspirate amount (mL):  0 Patient tolerance:  Patient tolerated the procedure well with no immediate complications   Allergies: Clindamycin/lincomycin and Trazodone and nefazodone   Assessment / Plan:     Visit Diagnoses: Primary osteoarthritis of both hands: She's been having pain and discomfort in her right CMC joint. Different treatment options were discussed. She had good response to cortisone injections in the past. She requested a cortisone injection today. After informed consent was obtained right CMC joint was injected with cortisone as described above she tolerated the procedure well.  Trigger middle finger of right hand: I will schedule ultrasound guided cortisone injection in the meantime I will advised splinting and using Voltaren gel.  Trochanteric bursitis of right hip: Severe pain and nocturnal pain. Different treatment options were discussed after informed consent was obtained the right trochanteric area was injected with cortisone as described above she tolerated the procedure well. I've given her a handout on IT band exercises as well.  Primary osteoarthritis of both feet: Proper fitting shoes were discussed.  Primary osteoarthritis of both knees: Weight loss, diet and exercise was discussed.  Rheumatoid factor positive - RF 19, CCP negative, ANA negative: She has no synovitis on exam today  Her other medical problems are as follows:  Essential hypertension  Hyperlipidemia  Gastroesophageal reflux disease  Coronary artery disease   Glaucoma    Orders: Orders Placed This Encounter  Procedures  . Large Joint Injection/Arthrocentesis  . Small Joint Injection/Arthrocentesis   No orders of the defined types were placed in this encounter.   Face-to-face time spent with patient was 30 minutes. 50% of time was spent in counseling and coordination of care.  Follow-Up Instructions: Return in about 6 months (around 09/15/2017) for  Osteoarthritis.   Bo Merino, MD  Note - This record has been created using Editor, commissioning.  Chart creation errors have been sought, but may not always  have been located. Such creation errors do not reflect on  the standard of medical care.

## 2017-03-10 DIAGNOSIS — M170 Bilateral primary osteoarthritis of knee: Secondary | ICD-10-CM | POA: Insufficient documentation

## 2017-03-15 ENCOUNTER — Encounter: Payer: Self-pay | Admitting: Rheumatology

## 2017-03-15 ENCOUNTER — Ambulatory Visit (INDEPENDENT_AMBULATORY_CARE_PROVIDER_SITE_OTHER): Payer: Medicare HMO | Admitting: Rheumatology

## 2017-03-15 VITALS — BP 122/70 | HR 78 | Resp 16 | Ht 62.0 in | Wt 200.0 lb

## 2017-03-15 DIAGNOSIS — M19042 Primary osteoarthritis, left hand: Secondary | ICD-10-CM

## 2017-03-15 DIAGNOSIS — M170 Bilateral primary osteoarthritis of knee: Secondary | ICD-10-CM

## 2017-03-15 DIAGNOSIS — I10 Essential (primary) hypertension: Secondary | ICD-10-CM | POA: Diagnosis not present

## 2017-03-15 DIAGNOSIS — R768 Other specified abnormal immunological findings in serum: Secondary | ICD-10-CM | POA: Diagnosis not present

## 2017-03-15 DIAGNOSIS — I2510 Atherosclerotic heart disease of native coronary artery without angina pectoris: Secondary | ICD-10-CM | POA: Diagnosis not present

## 2017-03-15 DIAGNOSIS — K219 Gastro-esophageal reflux disease without esophagitis: Secondary | ICD-10-CM

## 2017-03-15 DIAGNOSIS — M19071 Primary osteoarthritis, right ankle and foot: Secondary | ICD-10-CM

## 2017-03-15 DIAGNOSIS — E785 Hyperlipidemia, unspecified: Secondary | ICD-10-CM | POA: Diagnosis not present

## 2017-03-15 DIAGNOSIS — M7061 Trochanteric bursitis, right hip: Secondary | ICD-10-CM | POA: Diagnosis not present

## 2017-03-15 DIAGNOSIS — R7689 Other specified abnormal immunological findings in serum: Secondary | ICD-10-CM

## 2017-03-15 DIAGNOSIS — M19041 Primary osteoarthritis, right hand: Secondary | ICD-10-CM

## 2017-03-15 DIAGNOSIS — H409 Unspecified glaucoma: Secondary | ICD-10-CM

## 2017-03-15 DIAGNOSIS — M65331 Trigger finger, right middle finger: Secondary | ICD-10-CM

## 2017-03-15 DIAGNOSIS — M19072 Primary osteoarthritis, left ankle and foot: Secondary | ICD-10-CM

## 2017-03-15 MED ORDER — LIDOCAINE HCL 1 % IJ SOLN
1.5000 mL | INTRAMUSCULAR | Status: AC | PRN
  Administered 2017-03-15: 1.5 mL

## 2017-03-15 MED ORDER — LIDOCAINE HCL 1 % IJ SOLN
0.5000 mL | INTRAMUSCULAR | Status: AC | PRN
  Administered 2017-03-15: .5 mL

## 2017-03-15 MED ORDER — TRIAMCINOLONE ACETONIDE 40 MG/ML IJ SUSP
40.0000 mg | INTRAMUSCULAR | Status: AC | PRN
  Administered 2017-03-15: 40 mg via INTRA_ARTICULAR

## 2017-03-15 MED ORDER — TRIAMCINOLONE ACETONIDE 40 MG/ML IJ SUSP
10.0000 mg | INTRAMUSCULAR | Status: AC | PRN
  Administered 2017-03-15: 10 mg via INTRA_ARTICULAR

## 2017-03-15 NOTE — Patient Instructions (Signed)
Iliotibial Bursitis Rehab Ask your health care provider which exercises are safe for you. Do exercises exactly as told by your health care provider and adjust them as directed. It is normal to feel mild stretching, pulling, tightness, or discomfort as you do these exercises, but you should stop right away if you feel sudden pain or your pain gets worse.Do not begin these exercises until told by your health care provider. Stretching and range of motion exercises These exercises warm up your muscles and joints and improve the movement and flexibility of your leg. These exercises also help to relieve pain and stiffness. Exercise A: Quadriceps stretch, prone   1. Lie on your abdomen on a firm surface, such as a bed or padded floor. 2. Bend your left / right knee and hold your ankle. If you cannot reach your ankle or pant leg, loop a belt around your foot and grab the belt instead. 3. Gently pull your heel toward your buttocks. Your knee should not slide out to the side. You should feel a stretch in the front of your thigh and knee. 4. Hold this position for __________ seconds. Repeat __________ times. Complete this exercise __________ times a day. Exercise B: Lunge (  adductor stretch) 1. Stand and spread your legs about 3 feet (about 1 m) apart. Put your left / right leg slightly back for balance. 2. Lean away from your left / right leg by bending your other knee and shifting your weight toward your bent knee. You may rest your hands on your thigh for balance. You should feel a stretch in your left / right inner thigh. 3. Hold for __________ seconds. Repeat __________ times. Complete this exercise __________ times a day. Exercise C: Hamstring stretch, supine  1. Lie on your back. 2. Hold both ends of a belt or towel as you loop it over the ball of your left / right foot. The ball of your foot is on the walking surface, right under your toes. 3. Straighten your left / right knee and slowly pull on  the belt to raise your leg. Stop when you feel a gentle stretch in the back of your left / right knee or thigh.  Do not let your left / right knee bend.  Keep your other leg flat on the floor. 4. Hold this position for __________ seconds. Repeat __________ times. Complete this exercise __________ times a day. Strengthening exercises These exercises build strength and endurance in your leg. Endurance is the ability to use your muscles for a long time, even after they get tired. Exercise D: Quadriceps wall slides  1. Lean your back against a smooth wall or door while you walk your feet out 18-24 inches (46-61 cm) from it. 2. Place your feet hip-width apart. 3. Slowly slide down the wall or door until your knees bend as far as told by your health care provider. Keep your knees over your heels, not your toes. Keep your knees in line with your hips. 4. Hold for __________ seconds. 5. Push through your heels to stand up to rest for __________ seconds after each repetition. Repeat __________ times. Complete this exercise __________ times a day. Exercise E: Straight leg raises ( hip abductors) 1. Lie on your side, with your left / right leg in the top position. Lie so your head, shoulder, knee, and hip line up with each other. You may bend your bottom knee to help you balance. 2. Lift your top leg 4-6 inches (10-15 cm) while   keeping your toes pointed straight ahead. 3. Hold this position for __________ seconds. 4. Slowly lower your leg to the starting position. Allow your muscles to relax completely after each repetition. Repeat __________ times. Complete this exercise __________ times a day. Exercise F: Straight leg raises ( hip extensors) 1. Lie on your abdomen on a firm surface. You can put a pillow under your hips if that is more comfortable. 2. Tense the muscles in your buttocks and lift your left / right leg about 4-6 inches (10-15 cm). Keep your knee straight as you lift your leg. 3. Hold  this position for __________ seconds. 4. Slowly lower your leg to the starting position. 5. Let your leg relax completely after each repetition. Repeat __________ times. Complete this exercise __________ times a day. Exercise G: Bridge ( hip extensors) 1. Lie on your back on a firm surface with your knees bent and your feet flat on the floor. 2. Tighten your buttocks muscles and lift your bottom off the floor until your trunk is level with your thighs.  Do not arch your back.  You should feel the muscles working in your buttocks and the back of your thighs. If you do not feel these muscles, slide your feet 1-2 inches (2.5-5 cm) farther away from your buttocks. 3. Hold this position for __________ seconds. 4. Slowly lower your hips to the starting position. 5. Let your buttocks muscles relax completely between repetitions. 6. If this exercise is too easy, try doing it with your arms crossed over your chest. Repeat __________ times. Complete this exercise __________ times a day. This information is not intended to replace advice given to you by your health care provider. Make sure you discuss any questions you have with your health care provider. Document Released: 12/13/2005 Document Revised: 08/19/2016 Document Reviewed: 11/25/2015 Elsevier Interactive Patient Education  2017 [employer]  

## 2017-04-20 ENCOUNTER — Ambulatory Visit (INDEPENDENT_AMBULATORY_CARE_PROVIDER_SITE_OTHER): Payer: Medicare HMO | Admitting: Rheumatology

## 2017-04-20 DIAGNOSIS — M65331 Trigger finger, right middle finger: Secondary | ICD-10-CM

## 2017-04-20 MED ORDER — LIDOCAINE HCL 1 % IJ SOLN
0.5000 mL | INTRAMUSCULAR | Status: AC | PRN
  Administered 2017-04-20: .5 mL

## 2017-04-20 MED ORDER — TRIAMCINOLONE ACETONIDE 40 MG/ML IJ SUSP
10.0000 mg | INTRAMUSCULAR | Status: AC | PRN
  Administered 2017-04-20: 10 mg

## 2017-04-20 NOTE — Progress Notes (Signed)
   Procedure Note  Patient: [name]             Date of Birth: February 16, 1941           MRN: [id number]             Visit Date: 04/20/2017  Procedures: Visit Diagnoses: Right middle trigger finger  Hand/UE Inj Date/Time: 04/20/2017 1:21 PM Performed by: Bo Merino Authorized by: Bo Merino   Consent Given by:  Patient Site marked: the procedure site was marked   Timeout: prior to procedure the correct patient, procedure, and site was verified   Indications:  Therapeutic and tendon swelling Condition: trigger finger   Location:  Long finger Site:  R long A1 Prep: patient was prepped and draped in usual sterile fashion   Needle Size:  27 G Approach:  Volar Ultrasound Guidance: Yes   Medications:  0.5 mL lidocaine 1 %; 10 mg triamcinolone acetonide 40 MG/ML Aspirate amount (mL):  0 Patient tolerance:  Patient tolerated the procedure well with no immediate complications   Her right third middle finger was splinted.  Bo Merino, MD, [facility]

## 2017-04-27 ENCOUNTER — Ambulatory Visit: Payer: Medicare HMO | Admitting: Rheumatology

## 2017-05-11 ENCOUNTER — Ambulatory Visit: Payer: Medicare HMO | Admitting: Rheumatology

## 2017-06-01 ENCOUNTER — Ambulatory Visit: Payer: Medicare HMO | Admitting: Rheumatology

## 2017-06-22 ENCOUNTER — Telehealth: Payer: Self-pay | Admitting: Family Medicine

## 2017-06-22 NOTE — Telephone Encounter (Signed)
° ° °  Pt request refill of the following:  escitalopram (LEXAPRO) 20 MG tablet  omeprazole (PRILOSEC) 20 MG capsule     Phamacy:  [facility]

## 2017-06-23 ENCOUNTER — Other Ambulatory Visit: Payer: Self-pay

## 2017-06-23 MED ORDER — OMEPRAZOLE 20 MG PO CPDR: 20.0000 mg | DELAYED_RELEASE_CAPSULE | Freq: Every day | ORAL | 1 refills | Status: DC

## 2017-06-23 MED ORDER — OMEPRAZOLE 20 MG PO CPDR
20.0000 mg | DELAYED_RELEASE_CAPSULE | Freq: Every day | ORAL | 1 refills | Status: DC
Start: 2017-06-23 — End: 2017-11-16

## 2017-06-23 MED ORDER — ESCITALOPRAM OXALATE 20 MG PO TABS: 20.0000 mg | ORAL_TABLET | Freq: Every day | ORAL | 1 refills | Status: DC

## 2017-06-23 NOTE — Telephone Encounter (Signed)
Prescriptions have been refilled.

## 2017-07-19 ENCOUNTER — Other Ambulatory Visit: Payer: Medicare HMO

## 2017-08-01 ENCOUNTER — Ambulatory Visit (INDEPENDENT_AMBULATORY_CARE_PROVIDER_SITE_OTHER): Payer: Medicare HMO | Admitting: Family Medicine

## 2017-08-01 ENCOUNTER — Encounter: Payer: Self-pay | Admitting: Family Medicine

## 2017-08-01 VITALS — BP 112/82 | HR 83 | Temp 97.7°F | Ht 64.5 in | Wt 203.2 lb

## 2017-08-01 DIAGNOSIS — R61 Generalized hyperhidrosis: Secondary | ICD-10-CM | POA: Diagnosis not present

## 2017-08-01 DIAGNOSIS — I8393 Asymptomatic varicose veins of bilateral lower extremities: Secondary | ICD-10-CM

## 2017-08-01 DIAGNOSIS — R69 Illness, unspecified: Secondary | ICD-10-CM | POA: Diagnosis not present

## 2017-08-01 DIAGNOSIS — Z86010 Personal history of colonic polyps: Secondary | ICD-10-CM | POA: Diagnosis not present

## 2017-08-01 DIAGNOSIS — I10 Essential (primary) hypertension: Secondary | ICD-10-CM | POA: Diagnosis not present

## 2017-08-01 DIAGNOSIS — R739 Hyperglycemia, unspecified: Secondary | ICD-10-CM

## 2017-08-01 DIAGNOSIS — Z7189 Other specified counseling: Secondary | ICD-10-CM

## 2017-08-01 DIAGNOSIS — I2510 Atherosclerotic heart disease of native coronary artery without angina pectoris: Secondary | ICD-10-CM | POA: Diagnosis not present

## 2017-08-01 DIAGNOSIS — E785 Hyperlipidemia, unspecified: Secondary | ICD-10-CM

## 2017-08-01 DIAGNOSIS — F321 Major depressive disorder, single episode, moderate: Secondary | ICD-10-CM

## 2017-08-01 DIAGNOSIS — Z0000 Encounter for general adult medical examination without abnormal findings: Secondary | ICD-10-CM | POA: Diagnosis not present

## 2017-08-01 LAB — COMPREHENSIVE METABOLIC PANEL
ALT: 16 U/L (ref 0–35)
AST: 19 U/L (ref 0–37)
Albumin: 4.1 g/dL (ref 3.5–5.2)
Alkaline Phosphatase: 69 U/L (ref 39–117)
BILIRUBIN TOTAL: 1.1 mg/dL (ref 0.2–1.2)
BUN: 23 mg/dL (ref 6–23)
CHLORIDE: 102 meq/L (ref 96–112)
CO2: 30 meq/L (ref 19–32)
CREATININE: 0.78 mg/dL (ref 0.40–1.20)
Calcium: 9.5 mg/dL (ref 8.4–10.5)
GFR: 76.42 mL/min (ref >60.00)
GLUCOSE: 92 mg/dL (ref 70–99)
Potassium: 3.8 mEq/L (ref 3.5–5.1)
SODIUM: 140 meq/L (ref 135–145)
Total Protein: 6.5 g/dL (ref 6.0–8.3)

## 2017-08-01 LAB — CBC
HCT: 41.4 % (ref 36.0–46.0)
Hemoglobin: 14.1 g/dL (ref 12.0–15.0)
MCHC: 34 g/dL (ref 30.0–36.0)
MCV: 90.1 fl (ref 78.0–100.0)
PLATELETS: 256 10*3/uL (ref 150.0–400.0)
RBC: 4.6 Mil/uL (ref 3.87–5.11)
RDW: 13.4 % (ref 11.5–15.5)
WBC: 6.1 10*3/uL (ref 4.0–10.5)

## 2017-08-01 LAB — TSH: TSH: 3.41 u[IU]/mL (ref 0.35–4.50)

## 2017-08-01 LAB — LDL CHOLESTEROL, DIRECT: Direct LDL: 79 mg/dL

## 2017-08-01 LAB — HEMOGLOBIN A1C: Hgb A1c MFr Bld: 5.6 % (ref 4.6–6.5)

## 2017-08-01 MED ORDER — ATORVASTATIN CALCIUM 80 MG PO TABS: 80.0000 mg | ORAL_TABLET | Freq: Every evening | ORAL | 3 refills | Status: DC

## 2017-08-01 NOTE — Assessment & Plan Note (Signed)
S:  controlled on no rx with regular exercise BP Readings from Last 3 Encounters:  08/01/17 112/82  03/15/17 122/70  01/26/17 138/84  A/P: We discussed blood pressure goal of <140/90. Does have intermittent high blood pressure as noted in other section

## 2017-08-01 NOTE — Progress Notes (Addendum)
Phone: [phone]  Subjective:  Patient presents today for their annual physical. Chief complaint-noted.   See problem oriented charting- ROS- full  review of systems was completed and negative except for: diaphoresis, lightheadedness about twice a week- see below. Rare chest pain in let upper chest for 1-2 seconds.   The following were reviewed and entered/updated in epic: Past Medical History:  Diagnosis Date  . Anginal pain ([place]) 08/29/2012   "have had problems w/this"  . Anxiety 08/29/2012  . Arthritis 08/29/2012   right hand, Seen by Dr. Estanislado Pandy  . CAD (coronary artery disease)    nonobstructive  . Depression   . Exertional dyspnea 08/29/2012  . GERD (gastroesophageal reflux disease)   . Hammer toe    surgery x2, second with Dr. Sharol Given- only other option amputatoin  . HLD (hyperlipidemia)   . HTN (hypertension)   . PNEUMONIA 07/04/2007   Qualifier: Diagnosis of  By: Larose Kells MD, [place] Syncope and collapse 08/29/2012   "first time ever"   Patient Active Problem List   Diagnosis Date Noted  . Advanced directives, counseling/discussion 01/26/2017    Priority: High  . nonobstructive CAD (coronary artery disease) 12/01/2015    Priority: High  . Memory loss 12/01/2015    Priority: High  . History of adenomatous polyp of colon 08/01/2017    Priority: Medium  . Varicose veins of both lower extremities 12/01/2015    Priority: Medium  . Hyperlipidemia 05/19/2007    Priority: Medium  . Moderate major depression ([place]) 05/19/2007    Priority: Medium  . Essential hypertension 05/19/2007    Priority: Medium  . Insomnia 12/01/2015    Priority: Low  . Glaucoma 12/01/2015    Priority: Low  . Hammer toe     Priority: Low  . Arthritis 08/29/2012    Priority: Low  . GERD 09/06/2009    Priority: Low  . Edema 09/06/2009    Priority: Low  . History of syncope 09/06/2009    Priority: Low  . Hyperglycemia 05/19/2007    Priority: Low  . Primary osteoarthritis of both knees  03/10/2017  . Primary osteoarthritis of both hands 03/09/2017  . Primary osteoarthritis of both feet 03/09/2017  . Rheumatoid factor positive 03/09/2017   Past Surgical History:  Procedure Laterality Date  . ABDOMINAL HYSTERECTOMY  1970's  . APPENDECTOMY  ~ 1970  . CATARACT EXTRACTION W/ INTRAOCULAR LENS  IMPLANT, BILATERAL  ~ 2010   bilateral  . TONSILLECTOMY AND ADENOIDECTOMY  1950's  . VEIN LIGATION AND STRIPPING  2012   bilaterally    Family History  Problem Relation Age of Onset  . Heart disease Mother        brother, father  . Diabetes Mother        sister, brother  . Suicidality Father        80  . Coronary artery disease Brother        father  . Alcoholism Brother   . Ovarian cancer Daughter     Medications- reviewed and updated Current Outpatient Prescriptions  Medication Sig Dispense Refill  . aspirin 81 MG tablet Take 81 mg by mouth daily.     Marland Kitchen atorvastatin (LIPITOR) 80 MG tablet Take 1 tablet (80 mg total) by mouth every evening. 90 tablet 3  . escitalopram (LEXAPRO) 20 MG tablet Take 1 tablet (20 mg total) by mouth daily. 90 tablet 1  . omeprazole (PRILOSEC) 20 MG capsule Take 1 capsule (20 mg total) by mouth daily. [place]  capsule 1   No current facility-administered medications for this visit.     Allergies-reviewed and updated Allergies  Allergen Reactions  . Clindamycin/Lincomycin     Severe rash and into throat  . Trazodone And Nefazodone Other (See Comments)    drowsiness    Social History   Social History  . Marital status: Widowed    Spouse name: N/A  . Number of children: N/A  . Years of education: N/A   Social History Main Topics  . Smoking status: Never Smoker  . Smokeless tobacco: Never Used  . Alcohol use No  . Drug use: No  . Sexual activity: No   Other Topics Concern  . None   Social History Narrative   Widowed 11/20/13. Daughter died April 20, 2012. 2 living children. 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 Oropharynx normal Breasts: normal appearance, no masses or tenderness Neck: no thyromegaly CV: RRR no murmurs rubs or gallops Lungs: CTAB no crackles, wheeze, rhonchi Abdomen: soft/nontender/nondistended/normal bowel sounds. No rebound or guarding.  Ext: no edema Skin: warm, dry Neuro: grossly normal, moves all extremities, PERRLA  EKG: rate 62, sinus rhythm, normal axis, normal intervals, no hypertrophy, no st or t wave changes. Lead v1 did not pick up though. Unchanged compared to 06/14/14 except that v1 reading was present.   Assessment/Plan:  76 y.o. female presenting for annual physical.  Health Maintenance counseling: 1. Anticipatory guidance: Patient counseled regarding regular dental exams - not going- advised needs to go, eye exams - yearly, wearing seatbelts.  2. Risk factor reduction:  Advised patient of need for regular exercise and diet rich and fruits and vegetables to reduce risk of heart attack and stroke. Exercise- 6 hours of classes at the [facility] each week plus exercising at [employer]. Diet-weight unchanged largely but states has maintained being 2 pants sizes down (plus BP better).  Wt Readings from Last 3 Encounters:  08/01/17 203 lb 3.2 oz (92.2 kg)  03/15/17 200 lb (90.7 kg)  01/26/17 202 lb (91.6 kg)  3. Immunizations/screenings/ancillary studies- discussed shingrix at pharmacy Immunization History  Administered Date(s) Administered  . Influenza, High Dose Seasonal PF 08/24/2016  . Influenza-Unspecified  11/12/2015  . Pneumococcal Conjugate-13 08/24/2016  . Pneumococcal Polysaccharide-23 12/29/1999  . Pneumococcal-Unspecified 08/27/2010  . Td 12/28/2005, 10/26/2016  4. Cervical cancer screening-  aged out of preventative screening recommendations 5. Breast cancer screening-  breast exams at home (normal today)  and mammogram 01/26/17 6. Colon cancer screening - 02/18/10 and Dr. Deatra Ina recommended 5 year follow up- due to one tubular adenoma. Refer today  7. Skin cancer screening- advised regular sunscreen use. Denies worrisome skin lesions- one seborrheic keratosis noted back of left calf 8. Advanced directives- full code desired unless advanced dementia, unlikely recovery, or braindead  Status of chronic or acute concerns   Diaphoresis and elevated BP intermittent in setting of CAD -  Plan: EKG 12-Lead, Metanephrines, urine, 24 hour, TSH, Cardiac event monitor ( 1 week) S:Does have periods about twice a week lasting less than 5 minutes where she gets sweaty, red in the face, has nausea- never happens with rest- usually happens with activity. Every once and a while with this will get pain in left upper chest- muscles actually feel firm in area- this only lasts a few seconds and from cardiology notes 2013 has been an ongoing issue. Blood pressure usually up to 170 when this happens or even 180. Does feel short of breath with it. Does feel slightly dizzy.   Actually has had episodes like this in the past that later resolved- exactly what she is experiencing  One time had a energy bar and resolved. Has tried to eat before exercise but still occurs  A/P: Prior cardiac workup reassuring with negative stress echo 2012, mild CAD feb 1st 2006 by angiogram. Updated EKG stable    She actually was having a sweating episode during visit and EKG reassuring at that time. Also checked BP and in normal range- this happened at rest.   Doubt pheochromocytoma but with intermittent BP issues and diaphoresis check 24  hour urine metanephrine. Given intermittent nature will also check event monitor. Also update bloodwork to include thyroid  Advised 1 month follow up to reassess. Consider referring back to cardiology depending on workup.   Essential hypertension S:  controlled on no rx with regular exercise BP Readings from Last 3 Encounters:  08/01/17 112/82  03/15/17 122/70  01/26/17 138/84  A/P: We discussed blood pressure goal of <140/90. Does have intermittent high blood pressure as noted in other section  Moderate major depression (HCC) S: compliant with lexapro 20mg .   Patient reported a "lifted cloud" experience at last visit in regards to poor sleep and depressed mood. Had been going to [employer] 5 days a week, found some cahritable things to do- and sleep and derpressed mood. Prior was taking 3-4 hours to get to sleep but improved to 1.   Doing lots of gardening as well  Prior sleep trials- trazodone, tylenol pm, melatonin (had SE) Depression screen [facility] 2/9 08/01/2017 01/26/2017 12/01/2015  Decreased Interest 0 0 0  Down, Depressed, Hopeless 0 0 0  PHQ - 2 Score 0 0 0  A/P: continue lexapro- doing well Continue to consider doxepin as option in future if needed for sleep  Hyperlipidemia S: well controlled on atorvastatin 80mg . No myalgias.  Lab Results  Component Value Date   CHOL 153 10/26/2016   HDL 56.20 10/26/2016   LDLCALC 78 10/26/2016   LDLDIRECT 74.0 12/01/2015   TRIG 92.0 10/26/2016   CHOLHDL 3 10/26/2016   A/P: LDL<100 on last check. Check direct LDL only.   Hyperglycemia Has been compliant with regular exercise update a1c  Advanced directives, counseling/discussion Scan in 08/01/17.  Full code but would not want prolonged intubation or to be in vegetative state.   Varicose veins of both lower extremities Slightly worse on right leg- advised compression stockings  GERD- stable on prilosec Return in about 4 weeks (around 08/29/2017).  Orders Placed This Encounter    Procedures  . Metanephrines, urine, 24 hour    Standing Status:   Future    Standing Expiration Date:   08/01/2018  . CBC    [place]  . Comprehensive metabolic panel    [place]  . LDL cholesterol, direct    [place]  . TSH    [place]  . Hemoglobin A1c  [place]  . Ambulatory referral to Gastroenterology    Referral Priority:   Routine    Referral Type:   Consultation    Referral Reason:   Specialty Services Required    Number of Visits Requested:   1  . Cardiac event monitor    Standing Status:   Future    Standing Expiration Date:   10/01/2017    Scheduling Instructions:     1 week cardiac event monitor please  . EKG 12-Lead    Order Specific Question:   Where should this test be performed    Answer:   Other   Return precautions advised.   Garret Reddish, MD

## 2017-08-01 NOTE — Assessment & Plan Note (Signed)
S: compliant with lexapro 20mg .   Patient reported a "lifted cloud" experience at last visit in regards to poor sleep and depressed mood. Had been going to [employer] 5 days a week, found some cahritable things to do- and sleep and derpressed mood. Prior was taking 3-4 hours to get to sleep but improved to 1.   Doing lots of gardening as well  Prior sleep trials- trazodone, tylenol pm, melatonin (had SE) Depression screen [facility] 2/9 08/01/2017 01/26/2017 12/01/2015  Decreased Interest 0 0 0  Down, Depressed, Hopeless 0 0 0  PHQ - 2 Score 0 0 0  A/P: continue lexapro- doing well Continue to consider doxepin as option in future if needed for sleep

## 2017-08-01 NOTE — Assessment & Plan Note (Signed)
Slightly worse on right leg- advised compression stockings

## 2017-08-01 NOTE — Assessment & Plan Note (Signed)
S: well controlled on atorvastatin 80mg . No myalgias.  Lab Results  Component Value Date   CHOL 153 10/26/2016   HDL 56.20 10/26/2016   LDLCALC 78 10/26/2016   LDLDIRECT 74.0 12/01/2015   TRIG 92.0 10/26/2016   CHOLHDL 3 10/26/2016   A/P: LDL<100 on last check. Check direct LDL only.

## 2017-08-01 NOTE — Assessment & Plan Note (Signed)
Scan in 08/01/17.  Full code but would not want prolonged intubation or to be in vegetative state.

## 2017-08-01 NOTE — Patient Instructions (Signed)
Please stop by lab before you go  Need to collect urine- they will teach you

## 2017-08-01 NOTE — Assessment & Plan Note (Signed)
Has been compliant with regular exercise update a1c

## 2017-08-01 NOTE — Addendum Note (Signed)
Addended by: Tomi Likens on: 08/01/2017 09:43 AM   Modules accepted: Orders

## 2017-08-02 ENCOUNTER — Telehealth: Payer: Self-pay | Admitting: Gastroenterology

## 2017-08-02 NOTE — Telephone Encounter (Signed)
Yes patient is past due for surveillance colonoscopy. Please schedule the colonoscopy. Thanks

## 2017-08-02 NOTE — Telephone Encounter (Signed)
Dr Silverio Decamp reviewed her chart and reviewed the pathology results from the last colonoscopy. She is due a screening colonoscopy. Thank you

## 2017-08-02 NOTE — Telephone Encounter (Signed)
Referral received for patient to have colonoscopy. Last colon was in 2011 with Dr.Kaplan, and no recall was in system. Pt wants to know if our doctor thinks she should have another one. DOD for this afternoon is Dr.Nandigam. Please advise.

## 2017-08-08 ENCOUNTER — Encounter: Payer: Self-pay | Admitting: Gastroenterology

## 2017-08-12 ENCOUNTER — Encounter: Payer: Self-pay | Admitting: Family Medicine

## 2017-08-12 LAB — METANEPHRINES, URINE, 24 HOUR
Metaneph Total, Ur: 155 mcg/24 h — ABNORMAL LOW (ref 224–832)
Metanephrines, Ur: 31 mcg/24 h — ABNORMAL LOW (ref 90–315)
Normetanephrine, 24H Ur: 124 mcg/24 h (ref 122–676)

## 2017-08-15 ENCOUNTER — Ambulatory Visit (INDEPENDENT_AMBULATORY_CARE_PROVIDER_SITE_OTHER): Payer: Medicare HMO

## 2017-08-15 ENCOUNTER — Other Ambulatory Visit: Payer: Self-pay | Admitting: Family Medicine

## 2017-08-15 DIAGNOSIS — R42 Dizziness and giddiness: Secondary | ICD-10-CM

## 2017-08-15 DIAGNOSIS — I2510 Atherosclerotic heart disease of native coronary artery without angina pectoris: Secondary | ICD-10-CM

## 2017-08-15 DIAGNOSIS — R61 Generalized hyperhidrosis: Secondary | ICD-10-CM

## 2017-08-15 DIAGNOSIS — I10 Essential (primary) hypertension: Secondary | ICD-10-CM

## 2017-09-05 DIAGNOSIS — M65331 Trigger finger, right middle finger: Secondary | ICD-10-CM | POA: Insufficient documentation

## 2017-09-05 NOTE — Progress Notes (Signed)
Office Visit Note  Patient: [name]             Date of Birth: 1941-04-20           MRN: [id number]             PCP: Marin Olp, MD Referring: Marin Olp, MD Visit Date: 09/12/2017 Occupation: @GUAROCC @    Subjective:  Pain in both thumbs.   History of Present Illness: [name] is a 76 y.o. female with history of osteoarthritis. She states she continues to have a lot of discomfort in her bilateral CMC joints. She had right CMC joint injection about 5 months ago. She states she had some relief and now the pain has returned. Her right third trigger finger is doing better now. None of the other joints are painful. She has been using right CMC brace.  Activities of Daily Living:  Patient denies morning stiffness.    Patient Reports nocturnal pain.  Difficulty dressing/grooming: Denies Difficulty climbing stairs: Denies Difficulty getting out of chair: Denies Difficulty using hands for taps, buttons, cutlery, and/or writing: Denies   Review of Systems  Constitutional: Positive for weight gain. Negative for fatigue, night sweats, weight loss and weakness.  HENT: Negative.  Negative for mouth sores, trouble swallowing, trouble swallowing, mouth dryness and nose dryness.   Eyes: Negative.  Negative for pain, redness, visual disturbance and dryness.  Respiratory: Negative.  Negative for cough, shortness of breath and difficulty breathing.   Cardiovascular: Negative.  Negative for chest pain, palpitations, hypertension, irregular heartbeat and swelling in legs/feet.  Gastrointestinal: Negative.  Negative for blood in stool, constipation and diarrhea.  Endocrine: Negative.  Negative for increased urination.  Genitourinary: Negative.  Negative for vaginal dryness.  Musculoskeletal: Positive for arthralgias and joint pain. Negative for joint swelling, myalgias, muscle weakness, morning stiffness, muscle tenderness and myalgias.       Bilateral CMC pain at  night   Skin: Negative for color change, rash, hair loss, skin tightness, ulcers and sensitivity to sunlight.       Lower legs rash painful left lower ext.   Allergic/Immunologic: Negative for susceptible to infections.  Neurological: Negative.  Negative for dizziness, memory loss and night sweats.  Hematological: Negative.  Negative for swollen glands.  Psychiatric/Behavioral: Negative.  Negative for depressed mood and sleep disturbance. The patient is not nervous/anxious.     PMFS History:  Patient Active Problem List   Diagnosis Date Noted  . Trigger finger, right middle finger 09/05/2017  . History of adenomatous polyp of colon 08/01/2017  . Primary osteoarthritis of both knees 03/10/2017  . Primary osteoarthritis of both hands 03/09/2017  . Primary osteoarthritis of both feet 03/09/2017  . Rheumatoid factor positive 03/09/2017  . Advanced directives, counseling/discussion 01/26/2017  . nonobstructive CAD (coronary artery disease) 12/01/2015  . 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 Insomnia 12/01/2015  . Glaucoma 12/01/2015  . Memory loss 12/01/2015  . Hammer toe   . Arthritis 08/29/2012  . GERD 09/06/2009  . Edema 09/06/2009  . History of syncope 09/06/2009  . Hyperlipidemia 05/19/2007  . Moderate major depression ([place]) 05/19/2007  . Essential hypertension 05/19/2007  . Hyperglycemia 05/19/2007    Past Medical History:  Diagnosis Date  . Anginal pain ([place]) 08/29/2012   "have had problems w/this"  . Anxiety 08/29/2012  . Arthritis 08/29/2012   right hand, Seen by Dr. Estanislado Pandy  . CAD (coronary artery disease)    nonobstructive  . Depression   .  Exertional dyspnea 08/29/2012  . GERD (gastroesophageal reflux disease)   . Hammer toe    surgery x2, second with Dr. Sharol Given- only other option amputatoin  . HLD (hyperlipidemia)   . HTN (hypertension)   . PNEUMONIA 07/04/2007   Qualifier: Diagnosis of  By: Larose Kells MD, [place] Syncope and collapse 08/29/2012   "first  time ever"    Family History  Problem Relation Age of Onset  . Heart disease Mother        brother, father  . Diabetes Mother        sister, brother  . Suicidality Father        38  . Coronary artery disease Brother        father  . Alcoholism Brother   . Ovarian cancer Daughter    Past Surgical History:  Procedure Laterality Date  . ABDOMINAL HYSTERECTOMY  1970's  . APPENDECTOMY  ~ 1970  . CATARACT EXTRACTION W/ INTRAOCULAR LENS  IMPLANT, BILATERAL  ~ 13-Apr-2009   bilateral  . TONSILLECTOMY AND ADENOIDECTOMY  1950's  . VEIN LIGATION AND STRIPPING  2011-04-14   bilaterally   Social History   Social History Narrative   Widowed 13-Nov-2013. Daughter died April 13, 2012. 2 living children. 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      Hobbies: gardening, time with dog, time with grandkids     Objective: Vital Signs: BP 134/78   Pulse 74   Resp 16   Ht 5\' 5"  (1.651 m)   Wt 206 lb (93.4 kg)   BMI 34.28 kg/m    Physical Exam  Constitutional: She is oriented to person, place, and time. She appears well-developed and well-nourished.  HENT:  Head: Normocephalic and atraumatic.  Eyes: Conjunctivae and EOM are normal.  Neck: Normal range of motion.  Cardiovascular: Normal rate, regular rhythm, normal heart sounds and intact distal pulses.   Pulmonary/Chest: Effort normal and breath sounds normal.  Abdominal: Soft. Bowel sounds are normal.  Lymphadenopathy:    She has no cervical adenopathy.  Neurological: She is alert and oriented to person, place, and time.  Skin: Skin is warm and dry. Capillary refill takes less than 2 seconds.  Psychiatric: She has a normal mood and affect. Her behavior is normal.  Nursing note and vitals reviewed.    Musculoskeletal Exam: C-spine and thoracic lumbar spine fairly good range of motion. Shoulder joints elbow joints wrist  joints are good range of motion. She has DIP PIP thickening in her hands and bilateral CMC joints. He tenderness on palpation over right CMC joint. Hip joints are good range of motion. She should crepitus with range of motion of bilateral knee joints. She has some DIP PIP changes in her feet consistent with osteoarthritis. No synovitis or effusion was noted on examination today.  CDAI Exam: No CDAI exam completed.    Investigation: No additional findings.  CBC Latest Ref Rng & Units 08/01/2017 10/26/2016 12/01/2015  WBC 4.0 - 10.5 K/uL 6.1 5.0 5.7  Hemoglobin 12.0 - 15.0 g/dL 14.1 13.9 13.5  Hematocrit 36.0 - 46.0 % 41.4 40.2 40.8  Platelets 150.0 - 400.0 K/uL 256.0 234.0 227.0   CMP Latest Ref Rng & Units 08/01/2017 10/26/2016 01/03/2015  Glucose 70 - 99 mg/dL 92 92 -  BUN 6 - 23 mg/dL 23 27(H)  16  Creatinine 0.40 - 1.20 mg/dL 0.78 0.71 0.6  Sodium 135 - 145 mEq/L 140 141 139  Potassium 3.5 - 5.1 mEq/L 3.8 4.6 3.8  Chloride 96 - 112 mEq/L 102 104 -  CO2 19 - 32 mEq/L 30 29 -  Calcium 8.4 - 10.5 mg/dL 9.5 9.5 -  Total Protein 6.0 - 8.3 g/dL 6.5 6.4 -  Total Bilirubin 0.2 - 1.2 mg/dL 1.1 0.9 -  Alkaline Phos 39 - 117 U/L 69 67 61  AST 0 - 37 U/L 19 21 20   ALT 0 - 35 U/L 16 17 16    Imaging: No results found.  Speciality Comments: No specialty comments available.    Procedures:  Small Joint Inj Date/Time: 09/12/2017 11:53 AM Performed by: Bo Merino Authorized by: Bo Merino   Consent Given by:  Patient Site marked: the procedure site was marked   Timeout: prior to procedure the correct patient, procedure, and site was verified   Indications:  Pain Location:  Thumb Site:  R thumb CMC Prep: patient was prepped and draped in usual sterile fashion   Needle Size:  27 G Spinal Needle: No   Approach:  Radial Ultrasound Guided: Yes   Fluoroscopic Guidance: No   Medications:  0.5 mL lidocaine 1 %; 10 mg triamcinolone acetonide 40 MG/ML Aspiration Attempted: Yes     Aspirate amount (mL):  0 Patient tolerance:  Patient tolerated the procedure well with no immediate complications   Allergies: Clindamycin/lincomycin and Trazodone and nefazodone   Assessment / Plan:     Visit Diagnoses: Primary osteoarthritis of both hands: She continues to have some discomfort in her hands especially her CMC joints. Her right CMC joint is very painful. She good response to her previous injection which was done 5 months ago. She was to have repeat cortisone injection. She's been using CMC brace. After informed consent was obtained right CMC joint was injected as described above.  Trigger finger, right middle finger: Has improved.  Primary osteoarthritis of both knees: Joint protection and muscle strengthening discussed.  Primary osteoarthritis of both feet: She's been using proper fitting shoes.  Rheumatoid factor positive - RF 19, anti-CCP negative: She has no synovitis on examination.  Other medical problems are listed as follows:  Primary insomnia  History of adenomatous polyp of colon  History of gastroesophageal reflux (GERD)  History of depression  History of hyperlipidemia  Essential hypertension  Coronary artery disease involving native coronary artery of native heart without angina pectoris    Orders: Orders Placed This Encounter  Procedures  . Small Joint Injection/Arthrocentesis   No orders of the defined types were placed in this encounter.     Follow-Up Instructions: Return in about 6 months (around 03/12/2018) for Osteoarthritis.   Bo Merino, MD  Note - This record has been created using Editor, commissioning.  Chart creation errors have been sought, but may not always  have been located. Such creation errors do not reflect on  the standard of medical care.

## 2017-09-07 ENCOUNTER — Ambulatory Visit: Payer: Medicare HMO | Admitting: Family Medicine

## 2017-09-12 ENCOUNTER — Encounter: Payer: Self-pay | Admitting: Rheumatology

## 2017-09-12 ENCOUNTER — Ambulatory Visit (INDEPENDENT_AMBULATORY_CARE_PROVIDER_SITE_OTHER): Payer: Medicare HMO | Admitting: Rheumatology

## 2017-09-12 VITALS — BP 134/78 | HR 74 | Resp 16 | Ht 65.0 in | Wt 206.0 lb

## 2017-09-12 DIAGNOSIS — M65331 Trigger finger, right middle finger: Secondary | ICD-10-CM

## 2017-09-12 DIAGNOSIS — M19041 Primary osteoarthritis, right hand: Secondary | ICD-10-CM

## 2017-09-12 DIAGNOSIS — Z8659 Personal history of other mental and behavioral disorders: Secondary | ICD-10-CM

## 2017-09-12 DIAGNOSIS — Z8719 Personal history of other diseases of the digestive system: Secondary | ICD-10-CM | POA: Diagnosis not present

## 2017-09-12 DIAGNOSIS — M170 Bilateral primary osteoarthritis of knee: Secondary | ICD-10-CM

## 2017-09-12 DIAGNOSIS — I2510 Atherosclerotic heart disease of native coronary artery without angina pectoris: Secondary | ICD-10-CM | POA: Diagnosis not present

## 2017-09-12 DIAGNOSIS — F5101 Primary insomnia: Secondary | ICD-10-CM | POA: Diagnosis not present

## 2017-09-12 DIAGNOSIS — R768 Other specified abnormal immunological findings in serum: Secondary | ICD-10-CM | POA: Diagnosis not present

## 2017-09-12 DIAGNOSIS — I10 Essential (primary) hypertension: Secondary | ICD-10-CM

## 2017-09-12 DIAGNOSIS — Z8639 Personal history of other endocrine, nutritional and metabolic disease: Secondary | ICD-10-CM | POA: Diagnosis not present

## 2017-09-12 DIAGNOSIS — M19071 Primary osteoarthritis, right ankle and foot: Secondary | ICD-10-CM | POA: Diagnosis not present

## 2017-09-12 DIAGNOSIS — M19072 Primary osteoarthritis, left ankle and foot: Secondary | ICD-10-CM

## 2017-09-12 DIAGNOSIS — Z86010 Personal history of colonic polyps: Secondary | ICD-10-CM | POA: Diagnosis not present

## 2017-09-12 DIAGNOSIS — M19042 Primary osteoarthritis, left hand: Secondary | ICD-10-CM

## 2017-09-12 DIAGNOSIS — R69 Illness, unspecified: Secondary | ICD-10-CM | POA: Diagnosis not present

## 2017-09-12 MED ORDER — LIDOCAINE HCL 1 % IJ SOLN
0.5000 mL | INTRAMUSCULAR | Status: AC | PRN
  Administered 2017-09-12: .5 mL

## 2017-09-12 MED ORDER — TRIAMCINOLONE ACETONIDE 40 MG/ML IJ SUSP
10.0000 mg | INTRAMUSCULAR | Status: AC | PRN
  Administered 2017-09-12: 10 mg via INTRA_ARTICULAR

## 2017-09-12 NOTE — Patient Instructions (Signed)

## 2017-09-13 ENCOUNTER — Ambulatory Visit (AMBULATORY_SURGERY_CENTER): Payer: Medicare HMO

## 2017-09-13 ENCOUNTER — Telehealth: Payer: Self-pay

## 2017-09-13 VITALS — Ht 65.0 in | Wt 204.2 lb

## 2017-09-13 DIAGNOSIS — Z86010 Personal history of colonic polyps: Secondary | ICD-10-CM

## 2017-09-13 NOTE — Progress Notes (Signed)
Pt came into the office today for her PV prior to her colon with Dr Silverio Decamp on 09/27/17. Pt was requesting an endoscopy with her colon.  Sent message to Dr Silverio Decamp.   Per pt's request will cancel colon on 09/27/17 and reschedule an endo/colon in November if possible. Pt is aware to call our office if she does not hear from the nurse. Gwyndolyn Saxon

## 2017-09-13 NOTE — Telephone Encounter (Signed)
Dr Silverio Decamp, This pt is scheduled for a colon with you on 09/27/17. She is requesting an endoscopy at the same time as her colon, due to a sour taste in her mouth, a lot mucus and reflux. Pt is taking Omeprazole daily and did have her last endoscopy with Dr Deatra Ina in 02/13/10. Does pt need an OV with you or can we schedule an endo/Colon. Please advise. Thanks for your help. Gwyndolyn Saxon

## 2017-09-14 NOTE — Telephone Encounter (Signed)
Please schedule office visit with APP if available to see if need to do anything different in the interim and adjust her medication regimen. Ok to proceed with EGD and colonoscopy. Thanks

## 2017-09-19 ENCOUNTER — Encounter: Payer: Self-pay | Admitting: Nurse Practitioner

## 2017-09-19 ENCOUNTER — Ambulatory Visit (INDEPENDENT_AMBULATORY_CARE_PROVIDER_SITE_OTHER): Payer: Medicare HMO | Admitting: Nurse Practitioner

## 2017-09-19 VITALS — BP 110/80 | HR 72 | Ht 64.5 in | Wt 202.2 lb

## 2017-09-19 DIAGNOSIS — K219 Gastro-esophageal reflux disease without esophagitis: Secondary | ICD-10-CM | POA: Diagnosis not present

## 2017-09-19 DIAGNOSIS — Z86010 Personal history of colonic polyps: Secondary | ICD-10-CM

## 2017-09-19 DIAGNOSIS — R1310 Dysphagia, unspecified: Secondary | ICD-10-CM

## 2017-09-19 MED ORDER — NA SULFATE-K SULFATE-MG SULF 17.5-3.13-1.6 GM/177ML PO SOLN: ORAL | 0 refills | Status: DC

## 2017-09-19 NOTE — Patient Instructions (Signed)
You have been scheduled for an endoscopy and colonoscopy. Please follow the written instructions given to you at your visit today. Please pick up your prep supplies at the pharmacy within the next 1-3 days. If you use inhalers (even only as needed), please bring them with you on the day of your procedure. Your physician has requested that you go to www.startemmi.com and enter the access code given to you at your visit today. This web site gives a general overview about your procedure. However, you should still follow specific instructions given to you by our office regarding your preparation for the procedure.  If you are age 70 or older, your body mass index should be between 23-30. Your Body mass index is 34.18 kg/m. If this is out of the aforementioned range listed, please consider follow up with your Primary Care Provider.  If you are age 78 or younger, your body mass index should be between 19-25. Your Body mass index is 34.18 kg/m. If this is out of the aformentioned range listed, please consider follow up with your Primary Care Provider.

## 2017-09-19 NOTE — Progress Notes (Signed)
HPI: Patient is a 76 year old female previously followed by Dr. Deatra Ina but not seen in several years. She has a history of GERD and adenomatous colon polyps. Patient comes in today for evaluation of intermittent dysphagia. She's also been having frequent GERD symptoms. She has episodes of solid food dysphagia about 3 times a year but recently, after eating barbecue, she felt like the meat may not go down. She takes omeprazole every morning. Her heartburn is worse at night. She does not want to take a second Nexium. The head of her bed is elevated. She still gets heartburn 2-3 times a week. She does not always go to bed on an empty stomach. She has been under a lot of stress to. Patient lost her husband and within months lost her daughter last Thanksgiving.  She had an EGD in 2011 but it was for surveillance of a duodenal polyp. The exam was normal.  Past Medical History:  Diagnosis Date  . Anginal pain ([place]) 08/29/2012   "have had problems w/this"  . Anxiety 08/29/2012  . Arthritis 08/29/2012   right hand, Seen by Dr. Estanislado Pandy  . CAD (coronary artery disease)    nonobstructive  . Depression   . Exertional dyspnea 08/29/2012  . GERD (gastroesophageal reflux disease)   . Hammer toe    surgery x2, second with Dr. Sharol Given- only other option amputatoin  . HLD (hyperlipidemia)   . HTN (hypertension)   . PNEUMONIA 07/04/2007   Qualifier: Diagnosis of  By: Larose Kells MD, Patrick Syncope and collapse 08/29/2012   "first time ever"     Past Surgical History:  Procedure Laterality Date  . ABDOMINAL HYSTERECTOMY  1970's  . APPENDECTOMY  ~ 1970  . CATARACT EXTRACTION W/ INTRAOCULAR LENS  IMPLANT, BILATERAL  ~ 2010   bilateral  . TONSILLECTOMY AND ADENOIDECTOMY  1950's  . VEIN LIGATION AND STRIPPING  2012   bilaterally   Family History  Problem Relation Age of Onset  . Heart disease Mother        brother, father  . Diabetes Mother        sister, brother  . Suicidality Father        45  .  Coronary artery disease Brother   . Colon cancer Brother   . Stroke Brother   . Alcoholism Brother   . Ovarian cancer Daughter   . Esophageal cancer Neg Hx   . Rectal cancer Neg Hx   . Stomach cancer Neg Hx   . Liver cancer Neg Hx    Social History  Substance Use Topics  . Smoking status: Former Research scientist (life sciences)  . Smokeless tobacco: Never Used     Comment: teenager  . Alcohol use No   Current Outpatient Prescriptions  Medication Sig Dispense Refill  . aspirin 81 MG tablet Take 81 mg by mouth daily.     Marland Kitchen atorvastatin (LIPITOR) 80 MG tablet Take 1 tablet (80 mg total) by mouth every evening. 90 tablet 3  . escitalopram (LEXAPRO) 20 MG tablet Take 1 tablet (20 mg total) by mouth daily. 90 tablet 1  . omeprazole (PRILOSEC) 20 MG capsule Take 1 capsule (20 mg total) by mouth daily. 90 capsule 1   No current facility-administered medications for this visit.    Allergies  Allergen Reactions  . Clindamycin/Lincomycin     Severe rash and into throat  . Trazodone And Nefazodone Other (See Comments)    drowsiness     Review of Systems:  As for anxiety, arthritis, cough, depression and sore throat. All other systems reviewed and negative except where noted in HPI.    Physical Exam: BP 110/80   Pulse 72   Ht 5' 4.5" (1.638 m)   Wt 202 lb 4 oz (91.7 kg)   BMI 34.18 kg/m  Constitutional:  Well-developed,female in no acute distress. Psychiatric: Normal mood and affect. Behavior is normal. EENT: Pupils normal.  Conjunctivae are normal. No scleral icterus. Neck supple.  Cardiovascular: Normal rate, regular rhythm. No edema Pulmonary/chest: Effort normal and breath sounds normal. No wheezing, rales or rhonchi. Abdominal: Soft, nondistended. Nontender. Bowel sounds active throughout. There are no masses palpable. No hepatomegaly. Lymphadenopathy: No cervical adenopathy noted. Neurological: Alert and oriented to person place and time. Skin: Skin is warm and dry. No rashes  noted.   ASSESSMENT AND PLAN:  52. 76 year old female with intermittent dysphagia. Recent episode while eating BBQ. She wasn't sure food was going to pass.  -Advised patient to eat small bites, chew well with liquids in between bites to avoid food impaction. -Patient is due for surveillance colonoscopy, will arrange for EGD with possible dilation to be performed at same time. The risks and benefits of EGD were discussed and the patient agrees to proceed.   2. GERD, breakthrough heartburn on daily PPI. She doesn't want to increase PPI or add additional med. Will need to work on lifestyle modifications for now -We discussed importance of going to bed on an empty stomach.  -HOB already elevated. Could try a wedge pillow as well.  -further evaluation at time of EGD.   3. 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 She is due for surveillance colonoscopy. -Patient will be scheduled for a colonoscopy with possible polypectomy.  The risks and benefits of the procedure were discussed and the patient agrees to proceed.     Tye Savoy, NP  09/19/2017, 1:51 PM

## 2017-09-27 ENCOUNTER — Encounter: Payer: Medicare HMO | Admitting: Gastroenterology

## 2017-09-27 NOTE — Telephone Encounter (Signed)
Pt was scheduled an OV with Nevin Bloodgood on 09/19/17 to discuss her symptoms prior to an endoscopy.  Ok to schedule an endo/colon per Dr Silverio Decamp. Mariana Single was scheduled with Dr Silverio Decamp on 11/03/17. Pt to call back if she has any questions.

## 2017-09-29 DIAGNOSIS — R69 Illness, unspecified: Secondary | ICD-10-CM | POA: Diagnosis not present

## 2017-10-12 NOTE — Progress Notes (Signed)
Reviewed and agree with documentation and assessment and plan. K. Veena Aleysia Oltmann , MD   

## 2017-10-20 ENCOUNTER — Encounter: Payer: Self-pay | Admitting: Gastroenterology

## 2017-11-03 ENCOUNTER — Ambulatory Visit (AMBULATORY_SURGERY_CENTER): Payer: Medicare HMO | Admitting: Gastroenterology

## 2017-11-03 ENCOUNTER — Other Ambulatory Visit: Payer: Self-pay

## 2017-11-03 ENCOUNTER — Encounter: Payer: Self-pay | Admitting: Gastroenterology

## 2017-11-03 VITALS — BP 127/69 | HR 55 | Temp 98.9°F | Resp 13 | Ht 64.0 in | Wt 202.0 lb

## 2017-11-03 DIAGNOSIS — D122 Benign neoplasm of ascending colon: Secondary | ICD-10-CM | POA: Diagnosis not present

## 2017-11-03 DIAGNOSIS — D121 Benign neoplasm of appendix: Secondary | ICD-10-CM

## 2017-11-03 DIAGNOSIS — D123 Benign neoplasm of transverse colon: Secondary | ICD-10-CM | POA: Diagnosis not present

## 2017-11-03 DIAGNOSIS — D120 Benign neoplasm of cecum: Secondary | ICD-10-CM

## 2017-11-03 DIAGNOSIS — I10 Essential (primary) hypertension: Secondary | ICD-10-CM | POA: Diagnosis not present

## 2017-11-03 DIAGNOSIS — Z86010 Personal history of colonic polyps: Secondary | ICD-10-CM

## 2017-11-03 DIAGNOSIS — K635 Polyp of colon: Secondary | ICD-10-CM | POA: Diagnosis not present

## 2017-11-03 DIAGNOSIS — I2510 Atherosclerotic heart disease of native coronary artery without angina pectoris: Secondary | ICD-10-CM | POA: Diagnosis not present

## 2017-11-03 DIAGNOSIS — R1310 Dysphagia, unspecified: Secondary | ICD-10-CM

## 2017-11-03 DIAGNOSIS — D125 Benign neoplasm of sigmoid colon: Secondary | ICD-10-CM

## 2017-11-03 MED ORDER — SODIUM CHLORIDE 0.9 % IV SOLN: 500.0000 mL | INTRAVENOUS | Status: DC

## 2017-11-03 NOTE — Progress Notes (Signed)
Called to room to assist during endoscopic procedure.  Patient ID and intended procedure confirmed with present staff. Received instructions for my participation in the procedure from the performing physician.  

## 2017-11-03 NOTE — Patient Instructions (Signed)
YOU HAD AN ENDOSCOPIC PROCEDURE TODAY AT [facility]:   Refer to the procedure report that was given to you for any specific questions about what was found during the examination.  If the procedure report does not answer your questions, please call your gastroenterologist to clarify.  If you requested that your care partner not be given the details of your procedure findings, then the procedure report has been included in a sealed envelope for you to review at your convenience later.  YOU SHOULD EXPECT: Some feelings of bloating in the abdomen. Passage of more gas than usual.  Walking can help get rid of the air that was put into your GI tract during the procedure and reduce the bloating. If you had a lower endoscopy (such as a colonoscopy or flexible sigmoidoscopy) you may notice spotting of blood in your stool or on the toilet paper. If you underwent a bowel prep for your procedure, you may not have a normal bowel movement for a few days.  Please Note:  You might notice some irritation and congestion in your nose or some drainage.  This is from the oxygen used during your procedure.  There is no need for concern and it should clear up in a day or so.  SYMPTOMS TO REPORT IMMEDIATELY:   Following lower endoscopy (colonoscopy or flexible sigmoidoscopy):  Excessive amounts of blood in the stool  Significant tenderness or worsening of abdominal pains  Swelling of the abdomen that is new, acute  Fever of 100F or higher   Following upper endoscopy (EGD)  Vomiting of blood or coffee ground material  New chest pain or pain under the shoulder blades  Painful or persistently difficult swallowing  New shortness of breath  Fever of 100F or higher  Black, tarry-looking stools  For urgent or emergent issues, a gastroenterologist can be reached at any hour by calling [phone].   DIET:  We do recommend a small meal at first, but then you may proceed to your regular diet.  Drink  plenty of fluids but you should avoid alcoholic beverages for 24 hours.  MEDICATIONS: Continue present medications. No Aspirin, Ibuprofen, Naproxen, or other non-steroidal anti-inflammatory drugs.  Please see handouts given to you by your recovery nurse.  ACTIVITY:  You should plan to take it easy for the rest of today and you should NOT DRIVE or use heavy machinery until tomorrow (because of the sedation medicines used during the test).    FOLLOW UP: Our staff will call the number listed on your records the next business day following your procedure to check on you and address any questions or concerns that you may have regarding the information given to you following your procedure. If we do not reach you, we will leave a message.  However, if you are feeling well and you are not experiencing any problems, there is no need to return our call.  We will assume that you have returned to your regular daily activities without incident.  If any biopsies were taken you will be contacted by phone or by letter within the next 1-3 weeks.  Please call us at [phone] if you have not heard about the biopsies in 3 weeks.   Thank you for allowing [place] to provide for your healthcare needs today.   SIGNATURES/CONFIDENTIALITY: You and/or your care partner have signed paperwork which will be entered into your electronic medical record.  These signatures attest to the fact that that the information above on  your After Visit Summary has been reviewed and is understood.  Full responsibility of the confidentiality of this discharge information lies with you and/or your care-partner. 

## 2017-11-03 NOTE — Progress Notes (Signed)
Report given to PACU, vss 

## 2017-11-03 NOTE — Op Note (Signed)
[place] Patient Name: [name] Procedure Date: 11/03/2017 2:40 PM MRN: [id number] Endoscopist: Mauri Pole , MD Age: 76 Referring MD:  Date of Birth: Jun 24, 1941 Gender: Female Account #: [id number] Procedure:                Colonoscopy Indications:              High risk colon cancer surveillance: Personal                            history of colonic polyps, High risk colon cancer                            surveillance: Personal history of multiple (3 or                            more) adenomas Medicines:                Monitored Anesthesia Care Procedure:                Pre-Anesthesia Assessment:                           - Prior to the procedure, a History and Physical                            was performed, and patient medications and                            allergies were reviewed. The patient's tolerance of                            previous anesthesia was also reviewed. The risks                            and benefits of the procedure and the sedation                            options and risks were discussed with the patient.                            All questions were answered, and informed consent                            was obtained. Prior Anticoagulants: The patient has                            taken no previous anticoagulant or antiplatelet                            agents. ASA Grade Assessment: III - A patient with                            severe systemic disease. After reviewing the risks  and benefits, the patient was deemed in                            satisfactory condition to undergo the procedure.                           After obtaining informed consent, the colonoscope                            was passed under direct vision. Throughout the                            procedure, the patient's blood pressure, pulse, and                            oxygen saturations were monitored  continuously. The                            Colonoscope was introduced through the anus and                            advanced to the the cecum, identified by                            appendiceal orifice and ileocecal valve. The                            colonoscopy was performed without difficulty. The                            patient tolerated the procedure well. The quality                            of the bowel preparation was excellent. The                            ileocecal valve, appendiceal orifice, and rectum                            were photographed. Scope In: 3:00:30 PM Scope Out: 3:14:13 PM Scope Withdrawal Time: 0 hours 10 minutes 26 seconds  Total Procedure Duration: 0 hours 13 minutes 43 seconds  Findings:                 The perianal and digital rectal examinations were                            normal.                           Two sessile polyps were found in the appendiceal                            orifice and ileocecal valve. The polyps were 1 to 2  mm in size. These polyps were removed with a cold                            biopsy forceps. Resection and retrieval were                            complete.                           Five sessile polyps were found in the sigmoid                            colon, transverse colon and ascending colon. The                            polyps were 3 to 7 mm in size. These polyps were                            removed with a cold snare. Resection and retrieval                            were complete.                           Two small localized angioectasias without bleeding                            were found in the ascending colon and in the cecum.                           A few small-mouthed diverticula were found in the                            sigmoid colon.                           Non-bleeding internal hemorrhoids were found during                             retroflexion. The hemorrhoids were small. Complications:            No immediate complications. Estimated Blood Loss:     Estimated blood loss was minimal. Impression:               - Two 1 to 2 mm polyps at the appendiceal orifice                            and at the ileocecal valve, removed with a cold                            biopsy forceps. Resected and retrieved.                           - Five 3 to 7 mm polyps in the sigmoid colon, in  the transverse colon and in the ascending colon,                            removed with a cold snare. Resected and retrieved.                           - Two non-bleeding colonic angioectasias.                           - Diverticulosis in the sigmoid colon.                           - Non-bleeding internal hemorrhoids. Recommendation:           - Patient has a contact number available for                            emergencies. The signs and symptoms of potential                            delayed complications were discussed with the                            patient. Return to normal activities tomorrow.                            Written discharge instructions were provided to the                            patient.                           - Resume previous diet.                           - Continue present medications.                           - Await pathology results.                           - Repeat colonoscopy in 3 - 5 years for                            surveillance based on pathology results. Mauri Pole, MD 11/03/2017 3:25:09 PM This report has been signed electronically.

## 2017-11-03 NOTE — Op Note (Signed)
[place] Patient Name: [name] Procedure Date: 11/03/2017 2:40 PM MRN: [id number] Endoscopist: Mauri Pole , MD Age: 76 Referring MD:  Date of Birth: 30-Dec-1940 Gender: Female Account #: [id number] Procedure:                Upper GI endoscopy Indications:              Dysphagia Medicines:                Monitored Anesthesia Care Procedure:                Pre-Anesthesia Assessment:                           - Prior to the procedure, a History and Physical                            was performed, and patient medications and                            allergies were reviewed. The patient's tolerance of                            previous anesthesia was also reviewed. The risks                            and benefits of the procedure and the sedation                            options and risks were discussed with the patient.                            All questions were answered, and informed consent                            was obtained. Prior Anticoagulants: The patient has                            taken no previous anticoagulant or antiplatelet                            agents. ASA Grade Assessment: III - A patient with                            severe systemic disease. After reviewing the risks                            and benefits, the patient was deemed in                            satisfactory condition to undergo the procedure.                           After obtaining informed consent, the endoscope was  passed under direct vision. Throughout the                            procedure, the patient's blood pressure, pulse, and                            oxygen saturations were monitored continuously. The                            Model GIF-HQ190 [id number]) scope was introduced                            through the mouth, and advanced to the second part                            of duodenum. The upper GI  endoscopy was                            accomplished without difficulty. The patient                            tolerated the procedure well. Scope In: Scope Out: Findings:                 No endoscopic abnormality was evident in the                            esophagus to explain the patient's complaint of                            dysphagia. It was decided, however, to proceed with                            dilation in the distal esophagus. A TTS dilator was                            passed through the scope. Dilation with an 18-19-20                            mm balloon dilator was performed to 20 mm. The                            dilation site was examined following endoscope                            reinsertion and showed no change. Estimated blood                            loss: none.                           Mild, localized gastric antral vascular ectasia                            without  bleeding was present in the gastric antrum.                           A small hiatal hernia was present.                           The examined duodenum was normal. Complications:            No immediate complications. Estimated Blood Loss:     Estimated blood loss: none. Impression:               - No endoscopic esophageal abnormality to explain                            patient's dysphagia. Esophagus dilated. Dilated.                           - Gastric antral vascular ectasia without bleeding.                           - Small hiatal hernia.                           - Normal examined duodenum.                           - No specimens collected. Recommendation:           - Patient has a contact number available for                            emergencies. The signs and symptoms of potential                            delayed complications were discussed with the                            patient. Return to normal activities tomorrow.                            Written discharge  instructions were provided to the                            patient.                           - Resume previous diet.                           - Continue present medications.                           - No aspirin, ibuprofen, naproxen, or other                            non-steroidal anti-inflammatory drugs. Mauri Pole, MD 11/03/2017 3:21:01 PM This report has been signed electronically.

## 2017-11-04 ENCOUNTER — Telehealth: Payer: Self-pay | Admitting: *Deleted

## 2017-11-04 NOTE — Telephone Encounter (Signed)
  Follow up Call-  Call back number 11/03/2017  Post procedure Call Back phone  # [phone]  Permission to leave phone message Yes  Some recent data might be hidden     Patient questions:  Do you have a fever, pain , or abdominal swelling? No. Pain Score  0 *  Have you tolerated food without any problems? Yes.    Have you been able to return to your normal activities? Yes.    Do you have any questions about your discharge instructions: Diet   No. Medications  No. Follow up visit  No.  Do you have questions or concerns about your Care? No.  Actions: * If pain score is 4 or above: No action needed, pain <4.

## 2017-11-14 ENCOUNTER — Encounter: Payer: Self-pay | Admitting: Gastroenterology

## 2017-11-16 ENCOUNTER — Other Ambulatory Visit: Payer: Self-pay | Admitting: Family Medicine

## 2017-12-21 ENCOUNTER — Other Ambulatory Visit: Payer: Self-pay | Admitting: Family Medicine

## 2017-12-21 DIAGNOSIS — Z1231 Encounter for screening mammogram for malignant neoplasm of breast: Secondary | ICD-10-CM

## 2018-01-17 DIAGNOSIS — I83893 Varicose veins of bilateral lower extremities with other complications: Secondary | ICD-10-CM | POA: Diagnosis not present

## 2018-01-17 DIAGNOSIS — I83813 Varicose veins of bilateral lower extremities with pain: Secondary | ICD-10-CM | POA: Diagnosis not present

## 2018-01-17 DIAGNOSIS — I8311 Varicose veins of right lower extremity with inflammation: Secondary | ICD-10-CM | POA: Diagnosis not present

## 2018-01-20 DIAGNOSIS — I8312 Varicose veins of left lower extremity with inflammation: Secondary | ICD-10-CM | POA: Diagnosis not present

## 2018-01-20 DIAGNOSIS — I8311 Varicose veins of right lower extremity with inflammation: Secondary | ICD-10-CM | POA: Diagnosis not present

## 2018-01-26 DIAGNOSIS — I83813 Varicose veins of bilateral lower extremities with pain: Secondary | ICD-10-CM | POA: Diagnosis not present

## 2018-01-26 DIAGNOSIS — I8311 Varicose veins of right lower extremity with inflammation: Secondary | ICD-10-CM | POA: Diagnosis not present

## 2018-01-26 DIAGNOSIS — I8312 Varicose veins of left lower extremity with inflammation: Secondary | ICD-10-CM | POA: Diagnosis not present

## 2018-02-08 DIAGNOSIS — R69 Illness, unspecified: Secondary | ICD-10-CM | POA: Diagnosis not present

## 2018-02-27 NOTE — Progress Notes (Signed)
Office Visit Note  Patient: [name]             Date of Birth: [birth year]           MRN: [id number]             PCP: Marin Olp, MD Referring: Marin Olp, MD Visit Date: 03/13/2018 Occupation: @GUAROCC @    Subjective:  Bilateral CMC joint pain    History of Present Illness: [name] is a 77 y.o. female with history of osteoarthritis.  Patient reports she has been having increased pain in her bilateral CMC joints.  She states she has noticed some swelling in this region.  She states her hands also cause discomfort.  She denies any pain in her knees or feet.  She states that her right middle trigger finger has resolved after the cortisone injection.  She states that she has recently started yoga which has been helping with her neck stiffness.  She has been working on neck range of motion.  She states she hears some cracking and popping when she is working on her range of motion.  She denies any discomfort in her neck.  Patient states she continues to have insomnia.  She has an over-the-counter product as a sleep aid.   Activities of Daily Living:  Patient reports morning stiffness for 0 minutes.   Patient Denies nocturnal pain.  Difficulty dressing/grooming: Denies Difficulty climbing stairs: Denies Difficulty getting out of chair: Denies Difficulty using hands for taps, buttons, cutlery, and/or writing: Reports   Review of Systems  Constitutional: Positive for fatigue. Negative for weakness.  HENT: Negative for mouth sores, trouble swallowing, trouble swallowing, mouth dryness and nose dryness.   Eyes: Negative for pain, visual disturbance and dryness.  Respiratory: Negative for cough, hemoptysis, shortness of breath and difficulty breathing.   Cardiovascular: Negative for chest pain, palpitations, hypertension and swelling in legs/feet.  Gastrointestinal: Negative for blood in stool, constipation and diarrhea.  Endocrine: Negative for increased  urination.  Genitourinary: Negative for painful urination.  Musculoskeletal: Positive for arthralgias, joint pain and joint swelling. Negative for myalgias, muscle weakness, morning stiffness, muscle tenderness and myalgias.  Skin: Positive for color change. Negative for rash, hair loss, nodules/bumps, redness, skin tightness, ulcers and sensitivity to sunlight.  Allergic/Immunologic: Negative for susceptible to infections.  Neurological: Negative for dizziness, numbness and headaches.  Hematological: Negative for swollen glands.  Psychiatric/Behavioral: Positive for depressed mood and sleep disturbance. The patient is not nervous/anxious.     PMFS History:  Patient Active Problem List   Diagnosis Date Noted  . Trigger finger, right middle finger 09/05/2017  . History of adenomatous polyp of colon 08/01/2017  . Primary osteoarthritis of both knees 03/10/2017  . Primary osteoarthritis of both hands 03/09/2017  . Primary osteoarthritis of both feet 03/09/2017  . Rheumatoid factor positive 03/09/2017  . Advanced directives, counseling/discussion 01/26/2017  . nonobstructive CAD (coronary artery disease) 12/01/2015  . 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 Insomnia 12/01/2015  . Glaucoma 12/01/2015  . Memory loss 12/01/2015  . Hammer toe   . Arthritis 08/29/2012  . GERD 09/06/2009  . Edema 09/06/2009  . History of syncope 09/06/2009  . Hyperlipidemia 05/19/2007  . Moderate major depression ([place]) 05/19/2007  . Essential hypertension 05/19/2007  . Hyperglycemia 05/19/2007    Past Medical History:  Diagnosis Date  . Anginal pain ([place]) 08/29/2012   "have had problems w/this"  . Anxiety 08/29/2012  . Arthritis 08/29/2012  right hand, Seen by Dr. Estanislado Pandy  . CAD (coronary artery disease)    nonobstructive  . Depression   . Exertional dyspnea 08/29/2012  . GERD (gastroesophageal reflux disease)   . Hammer toe    surgery x2, second with Dr. Sharol Given- only other option  amputatoin  . HLD (hyperlipidemia)   . HTN (hypertension)   . PNEUMONIA 07/04/2007   Qualifier: Diagnosis of  By: Larose Kells MD, [place] Syncope and collapse 08/29/2012   "first time ever"    Family History  Problem Relation Age of Onset  . Heart disease Mother        brother, father  . Diabetes Mother        sister, brother  . Suicidality Father        84  . Coronary artery disease Brother   . Colon cancer Brother   . Stroke Brother   . Alcoholism Brother   . Ovarian cancer Daughter   . Esophageal cancer Neg Hx   . Rectal cancer Neg Hx   . Stomach cancer Neg Hx   . Liver cancer Neg Hx    Past Surgical History:  Procedure Laterality Date  . ABDOMINAL HYSTERECTOMY  1970's  . APPENDECTOMY  ~ 1970  . CATARACT EXTRACTION W/ INTRAOCULAR LENS  IMPLANT, BILATERAL  ~ 04/11/09   bilateral  . TONSILLECTOMY AND ADENOIDECTOMY  1950's  . VEIN LIGATION AND STRIPPING  2011-04-12   bilaterally   Social History   Social History Narrative   Widowed 2013-11-11. Daughter died 04/11/12. 2 living children. 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 She appears well-developed and well-nourished.  HENT:  Head: Normocephalic and atraumatic.  Eyes: Conjunctivae and EOM are normal.  Neck: Normal range of motion.  Cardiovascular: Normal rate, regular rhythm, normal heart sounds and intact distal pulses.  Pulmonary/Chest: Effort normal and breath sounds normal.  Abdominal: Soft. Bowel sounds are normal.  Lymphadenopathy:    She has no cervical  adenopathy.  Neurological: She is alert and oriented to person, place, and time.  Skin: Skin is warm and dry. Capillary refill takes less than 2 seconds.  Psychiatric: She has a normal mood and affect. Her behavior is normal.  Nursing note and vitals reviewed.    Musculoskeletal Exam: C-spine limited range of motion with lateral rotation. Thoracic and lumbar spine good ROM.  No midline spinal tenderness.  No SI joint tenderness.  Shoulder joints, elbow joints, wrist joints, MCPs, PIPs, and DIPs good ROM with no synovitis.  She has PIP and DIP synovial thickening consistent with osteoarthritis.  Bilateral CMC joint synovial thickening and tenderness.  Hip joints, knee joints, ankle joints, MCPs, PIPs, and DIPs good ROM with no synovitis.  No warmth or effusion of knees.  She has bilateral knee crepitus.   CDAI Exam: CDAI Homunculus Exam:   Joint Counts:  CDAI Tender Joint count:  0 CDAI Swollen Joint count: 0     Investigation: No additional findings.   Imaging: No results found.  Speciality Comments: No specialty comments available.    Procedures:  No procedures performed Allergies: Clindamycin/lincomycin and Trazodone and nefazodone   Assessment / Plan:     Visit Diagnoses: Primary osteoarthritis of both hands: She has PIP and DIP synovial thickening consistent with osteoarthritis.  She has synovial thickening of bilateral CMC joints and tenderness with range of motion.  She has no synovitis on exam.  Joint protection and muscle strengthening were discussed.  She was given a handout of hand exercises that she can perform at home.  She was given a prescription for bilateral CMC joint braces.  Primary osteoarthritis of both knees: She has bilateral knee crepitus.  No warmth or effusion on exam.  She has not been experiencing discomfort in her bilateral knees.  Primary osteoarthritis of both feet: She has no discomfort at this time.  She wears proper fitting shoes.    Trigger  finger, right middle finger: Resolved.   Other medical conditions are listed as follows:   Rheumatoid factor positive - RF 19, anti-CCP negative  History of coronary artery disease  Primary insomnia  History of adenomatous polyp of colon  History of hypertension  History of gastroesophageal reflux (GERD)  History of hyperlipidemia  History of depression    Orders: No orders of the defined types were placed in this encounter.  Meds ordered this encounter  Medications  . diclofenac sodium (VOLTAREN) 1 % GEL    Sig: Apply 3 gm to 3 large joints up to 3 times a day.Dispense 3 tubes with 3 refills.    Dispense:  3 Tube    Refill:  1     Follow-Up Instructions: Return in about 6 months (around 09/13/2018) for Osteoarthritis.   Hazel Sams PA-C  I examined and evaluated the patient with Hazel Sams PA.  She does have PIP DIP and CMC thickening on my examination.  Joint protection was discussed.  She requested a prescription for diclofenac which was given.  Side effects were reviewed.  The plan of care was discussed as noted above.   Bo Merino, MD  Note - This record has been created using Editor, commissioning.  Chart creation errors have been sought, but may not always  have been located. Such creation errors do not reflect on  the standard of medical care.

## 2018-03-03 DIAGNOSIS — I83812 Varicose veins of left lower extremities with pain: Secondary | ICD-10-CM | POA: Diagnosis not present

## 2018-03-03 DIAGNOSIS — I83892 Varicose veins of left lower extremities with other complications: Secondary | ICD-10-CM | POA: Diagnosis not present

## 2018-03-03 DIAGNOSIS — I8312 Varicose veins of left lower extremity with inflammation: Secondary | ICD-10-CM | POA: Diagnosis not present

## 2018-03-06 DIAGNOSIS — I8312 Varicose veins of left lower extremity with inflammation: Secondary | ICD-10-CM | POA: Diagnosis not present

## 2018-03-07 DIAGNOSIS — H52203 Unspecified astigmatism, bilateral: Secondary | ICD-10-CM | POA: Diagnosis not present

## 2018-03-07 DIAGNOSIS — Z961 Presence of intraocular lens: Secondary | ICD-10-CM | POA: Diagnosis not present

## 2018-03-08 DIAGNOSIS — R69 Illness, unspecified: Secondary | ICD-10-CM | POA: Diagnosis not present

## 2018-03-13 ENCOUNTER — Ambulatory Visit: Payer: Medicare HMO | Admitting: Rheumatology

## 2018-03-13 ENCOUNTER — Encounter: Payer: Self-pay | Admitting: Rheumatology

## 2018-03-13 ENCOUNTER — Ambulatory Visit
Admission: RE | Admit: 2018-03-13 | Discharge: 2018-03-13 | Disposition: A | Discharge status: Home / Self Care | Payer: Medicare HMO | Source: Ambulatory Visit | Attending: Family Medicine | Admitting: Family Medicine

## 2018-03-13 VITALS — BP 142/83 | HR 61 | Resp 15 | Ht 65.0 in | Wt 207.0 lb

## 2018-03-13 DIAGNOSIS — M65331 Trigger finger, right middle finger: Secondary | ICD-10-CM | POA: Diagnosis not present

## 2018-03-13 DIAGNOSIS — R7689 Other specified abnormal immunological findings in serum: Secondary | ICD-10-CM

## 2018-03-13 DIAGNOSIS — Z8659 Personal history of other mental and behavioral disorders: Secondary | ICD-10-CM

## 2018-03-13 DIAGNOSIS — M19071 Primary osteoarthritis, right ankle and foot: Secondary | ICD-10-CM

## 2018-03-13 DIAGNOSIS — Z8639 Personal history of other endocrine, nutritional and metabolic disease: Secondary | ICD-10-CM

## 2018-03-13 DIAGNOSIS — Z1231 Encounter for screening mammogram for malignant neoplasm of breast: Secondary | ICD-10-CM | POA: Diagnosis not present

## 2018-03-13 DIAGNOSIS — R768 Other specified abnormal immunological findings in serum: Secondary | ICD-10-CM | POA: Diagnosis not present

## 2018-03-13 DIAGNOSIS — M19072 Primary osteoarthritis, left ankle and foot: Secondary | ICD-10-CM

## 2018-03-13 DIAGNOSIS — Z860101 Personal history of adenomatous and serrated colon polyps: Secondary | ICD-10-CM

## 2018-03-13 DIAGNOSIS — M170 Bilateral primary osteoarthritis of knee: Secondary | ICD-10-CM | POA: Diagnosis not present

## 2018-03-13 DIAGNOSIS — Z8679 Personal history of other diseases of the circulatory system: Secondary | ICD-10-CM | POA: Diagnosis not present

## 2018-03-13 DIAGNOSIS — Z86010 Personal history of colonic polyps: Secondary | ICD-10-CM | POA: Diagnosis not present

## 2018-03-13 DIAGNOSIS — F5101 Primary insomnia: Secondary | ICD-10-CM | POA: Diagnosis not present

## 2018-03-13 DIAGNOSIS — Z8719 Personal history of other diseases of the digestive system: Secondary | ICD-10-CM

## 2018-03-13 DIAGNOSIS — M19041 Primary osteoarthritis, right hand: Secondary | ICD-10-CM

## 2018-03-13 DIAGNOSIS — M19042 Primary osteoarthritis, left hand: Secondary | ICD-10-CM

## 2018-03-13 DIAGNOSIS — R69 Illness, unspecified: Secondary | ICD-10-CM | POA: Diagnosis not present

## 2018-03-13 MED ORDER — DICLOFENAC SODIUM 1 % TD GEL: TRANSDERMAL | 1 refills | Status: DC

## 2018-03-13 NOTE — Patient Instructions (Signed)

## 2018-03-20 DIAGNOSIS — M25531 Pain in right wrist: Secondary | ICD-10-CM | POA: Diagnosis not present

## 2018-03-20 DIAGNOSIS — M25532 Pain in left wrist: Secondary | ICD-10-CM | POA: Diagnosis not present

## 2018-03-24 DIAGNOSIS — I8312 Varicose veins of left lower extremity with inflammation: Secondary | ICD-10-CM | POA: Diagnosis not present

## 2018-03-24 DIAGNOSIS — I83812 Varicose veins of left lower extremities with pain: Secondary | ICD-10-CM | POA: Diagnosis not present

## 2018-03-29 ENCOUNTER — Ambulatory Visit: Payer: Medicare HMO | Admitting: Family Medicine

## 2018-04-04 DIAGNOSIS — I83812 Varicose veins of left lower extremities with pain: Secondary | ICD-10-CM | POA: Diagnosis not present

## 2018-04-04 DIAGNOSIS — I8312 Varicose veins of left lower extremity with inflammation: Secondary | ICD-10-CM | POA: Diagnosis not present

## 2018-04-27 DIAGNOSIS — M7981 Nontraumatic hematoma of soft tissue: Secondary | ICD-10-CM | POA: Diagnosis not present

## 2018-04-27 DIAGNOSIS — I8312 Varicose veins of left lower extremity with inflammation: Secondary | ICD-10-CM | POA: Diagnosis not present

## 2018-05-03 DIAGNOSIS — R69 Illness, unspecified: Secondary | ICD-10-CM | POA: Diagnosis not present

## 2018-05-08 DIAGNOSIS — I8311 Varicose veins of right lower extremity with inflammation: Secondary | ICD-10-CM | POA: Diagnosis not present

## 2018-05-08 DIAGNOSIS — I83811 Varicose veins of right lower extremities with pain: Secondary | ICD-10-CM | POA: Diagnosis not present

## 2018-05-12 ENCOUNTER — Encounter: Payer: Self-pay | Admitting: Family Medicine

## 2018-05-12 ENCOUNTER — Ambulatory Visit (INDEPENDENT_AMBULATORY_CARE_PROVIDER_SITE_OTHER): Payer: Medicare HMO | Admitting: Family Medicine

## 2018-05-12 DIAGNOSIS — R69 Illness, unspecified: Secondary | ICD-10-CM | POA: Diagnosis not present

## 2018-05-12 DIAGNOSIS — E785 Hyperlipidemia, unspecified: Secondary | ICD-10-CM | POA: Diagnosis not present

## 2018-05-12 DIAGNOSIS — R61 Generalized hyperhidrosis: Secondary | ICD-10-CM | POA: Diagnosis not present

## 2018-05-12 DIAGNOSIS — I10 Essential (primary) hypertension: Secondary | ICD-10-CM

## 2018-05-12 DIAGNOSIS — F3341 Major depressive disorder, recurrent, in partial remission: Secondary | ICD-10-CM

## 2018-05-12 LAB — BASIC METABOLIC PANEL
BUN: 14 mg/dL (ref 6–23)
CALCIUM: 9.2 mg/dL (ref 8.4–10.5)
CHLORIDE: 104 meq/L (ref 96–112)
CO2: 30 meq/L (ref 19–32)
Creatinine, Ser: 0.78 mg/dL (ref 0.40–1.20)
GFR: 76.26 mL/min (ref >60.00)
Glucose, Bld: 91 mg/dL (ref 70–99)
POTASSIUM: 4.4 meq/L (ref 3.5–5.1)
SODIUM: 141 meq/L (ref 135–145)

## 2018-05-12 LAB — LIPID PANEL
CHOLESTEROL: 142 mg/dL (ref 0–200)
HDL: 54.1 mg/dL (ref >39.00)
LDL CALC: 72 mg/dL (ref 0–99)
NONHDL: 88.18
Total CHOL/HDL Ratio: 3
Triglycerides: 81 mg/dL (ref 0.0–149.0)
VLDL: 16.2 mg/dL (ref 0.0–40.0)

## 2018-05-12 LAB — CBC
HEMATOCRIT: 40.3 % (ref 36.0–46.0)
Hemoglobin: 13.7 g/dL (ref 12.0–15.0)
MCHC: 34.1 g/dL (ref 30.0–36.0)
MCV: 87.8 fl (ref 78.0–100.0)
Platelets: 234 10*3/uL (ref 150.0–400.0)
RBC: 4.59 Mil/uL (ref 3.87–5.11)
RDW: 13.8 % (ref 11.5–15.5)
WBC: 5 10*3/uL (ref 4.0–10.5)

## 2018-05-12 MED ORDER — ATORVASTATIN CALCIUM 80 MG PO TABS: 80.0000 mg | ORAL_TABLET | Freq: Every evening | ORAL | 3 refills | Status: DC

## 2018-05-12 MED ORDER — OMEPRAZOLE 20 MG PO CPDR: 20.0000 mg | DELAYED_RELEASE_CAPSULE | Freq: Every day | ORAL | 1 refills | Status: DC

## 2018-05-12 MED ORDER — DOXEPIN HCL 3 MG PO TABS: 3.0000 mg | ORAL_TABLET | Freq: Every evening | ORAL | 2 refills | Status: DC | PRN

## 2018-05-12 MED ORDER — ESCITALOPRAM OXALATE 20 MG PO TABS: 20.0000 mg | ORAL_TABLET | Freq: Every day | ORAL | 1 refills | Status: DC

## 2018-05-12 NOTE — Assessment & Plan Note (Signed)
S:  controlled on atorvastatin 80mg  with last LDL 78 Lab Results  Component Value Date   CHOL 153 10/26/2016   HDL 56.20 10/26/2016   LDLCALC 78 10/26/2016   LDLDIRECT 79.0 08/01/2017   TRIG 92.0 10/26/2016   CHOLHDL 3 10/26/2016   A/P: update lipids today

## 2018-05-12 NOTE — Assessment & Plan Note (Signed)
S: controlled on  no rx with regular exercise BP Readings from Last 3 Encounters:  05/12/18 128/76  03/13/18 (!) 142/83  11/03/17 127/69  A/P: We discussed blood pressure goal of <140/90. Continue current meds

## 2018-05-12 NOTE — Patient Instructions (Addendum)
I would also like for you to sign up for an annual wellness visit with one of our nurses, Cassie or Manuela Schwartz, who both specialize in the annual wellness visit. This is a free benefit under medicare that may help [place] find additional ways to help you. Some highlights are reviewing medications, lifestyle, and doing a dementia screen.   Try doxepin for sleep  Please stop by lab before you go

## 2018-05-12 NOTE — Assessment & Plan Note (Signed)
Diaphoresis, lightheadedness twice a week last visit. We did a workup for this including even metanephrines and heart monitor which was reassuring- had some rare PACs. Patient states this resolved as quickly as it started- she thinks it was related to low blood sugar as eating regular snacks resolved issues.

## 2018-05-12 NOTE — Assessment & Plan Note (Addendum)
S: She is on lexapro 20mg  with phq9 today of 8- but was just fathers day and birthday of daughter that has passed away- so situationally worse.   Gardening is her therapy she says. Doing yoga 4x a week! Plus some [employer] on the weekends.   Takes a few hours to get to sleep again. We have considered doxepin if needed for sleep. See prior trials A/P: major depression in partial remission.  continue current medications. She is strongly considering seeing one of our behavioral health specialist to help her improve even more.   We will trial doxepin for sleep

## 2018-05-12 NOTE — Progress Notes (Signed)
Subjective:  [name] is a 77 y.o. year old very pleasant female patient who presents for/with See problem oriented charting ROS- No chest pain or shortness of breath. No headache or blurry vision. No more diaphoretic episodes   Past Medical History-  Patient Active Problem List   Diagnosis Date Noted  . Advanced directives, counseling/discussion 01/26/2017    Priority: High  . nonobstructive CAD (coronary artery disease) 12/01/2015    Priority: High  . Memory loss 12/01/2015    Priority: High  . History of adenomatous polyp of colon 08/01/2017    Priority: Medium  . Varicose veins of both lower extremities 12/01/2015    Priority: Medium  . Hyperlipidemia 05/19/2007    Priority: Medium  . Moderate major depression ([place]) 05/19/2007    Priority: Medium  . Essential hypertension 05/19/2007    Priority: Medium  . Insomnia 12/01/2015    Priority: Low  . Glaucoma 12/01/2015    Priority: Low  . Hammer toe     Priority: Low  . Arthritis 08/29/2012    Priority: Low  . GERD 09/06/2009    Priority: Low  . Edema 09/06/2009    Priority: Low  . History of syncope 09/06/2009    Priority: Low  . Hyperglycemia 05/19/2007    Priority: Low  . 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 Trigger finger, right middle finger 09/05/2017  . Primary osteoarthritis of both knees 03/10/2017  . Primary osteoarthritis of both hands 03/09/2017  . Primary osteoarthritis of both feet 03/09/2017  . Rheumatoid factor positive 03/09/2017    Medications- reviewed and updated Current Outpatient Medications  Medication Sig Dispense Refill  . aspirin 81 MG tablet Take 81 mg by mouth daily.     Marland Kitchen atorvastatin (LIPITOR) 80 MG tablet Take 1 tablet (80 mg total) by mouth every evening. 90 tablet 3  . diclofenac sodium (VOLTAREN) 1 % GEL Apply 3 gm to 3 large joints up to 3 times a day.Dispense 3 tubes with 3 refills. 3 Tube 1  . escitalopram (LEXAPRO) 20 MG tablet Take 1 tablet (20 mg total) by mouth daily. 90  tablet 1  . omeprazole (PRILOSEC) 20 MG capsule Take 1 capsule (20 mg total) by mouth daily. 90 capsule 1  . Doxepin HCl 3 MG TABS Take 1 tablet (3 mg total) by mouth at bedtime as needed. 30 tablet 2   No current facility-administered medications for this visit.     Objective: BP 128/76 (BP Location: Left Arm, Patient Position: Sitting, Cuff Size: Large)   Pulse 69   Temp 97.8 F (36.6 C) (Oral)   Ht 5\' 5"  (1.651 m)   Wt 199 lb 9.6 oz (90.5 kg)   SpO2 97%   BMI 33.22 kg/m  Gen: NAD, resting comfortably CV: RRR no murmurs rubs or gallops Lungs: CTAB no crackles, wheeze, rhonchi Abdomen: soft/nontender/nondistended Ext: no edema Skin: warm, dry Neuro: normal gait and speech  Assessment/Plan:  Other notes: 1. last a1c not even in prediabetes range. She is down 4 more lbs since then! 2. Have advised compression stockings for varicose veins- she is using some 3. Consider repeat MMSE at next visit  Moderate major depression ([place]) S: She is on lexapro 20mg  with phq9 today of 8- but was just fathers day and birthday of daughter that has passed away- so situationally worse.   Gardening is her therapy she says. Doing yoga 4x a week! Plus some [employer] on the weekends.   Takes a few hours to get to sleep  again. We have considered doxepin if needed for sleep. See prior trials A/P: major depression in partial remission.  continue current medications. She is strongly considering seeing one of our behavioral health specialist to help her improve even more.   We will trial doxepin for sleep  Essential hypertension S: controlled on  no rx with regular exercise BP Readings from Last 3 Encounters:  05/12/18 128/76  03/13/18 (!) 142/83  11/03/17 127/69  A/P: We discussed blood pressure goal of <140/90. Continue current meds  Hyperlipidemia S:  controlled on atorvastatin 80mg  with last LDL 78 Lab Results  Component Value Date   CHOL 153 10/26/2016   HDL 56.20 10/26/2016    LDLCALC 78 10/26/2016   LDLDIRECT 79.0 08/01/2017   TRIG 92.0 10/26/2016   CHOLHDL 3 10/26/2016   A/P: update lipids today  Diaphoresis Diaphoresis, lightheadedness twice a week last visit. We did a workup for this including even metanephrines and heart monitor which was reassuring- had some rare PACs. Patient states this resolved as quickly as it started- she thinks it was related to low blood sugar as eating regular snacks resolved issues.    Future Appointments  Date Time Provider [place]  09/15/2018 11:15 AM Bo Merino, MD [facility] None   Return in about 6 months (around 11/12/2018) for physical.  Lab/Order associations: Hyperlipidemia, unspecified hyperlipidemia type - Plan: CBC, Basic metabolic panel, Lipid panel   Meds ordered this encounter  Medications  . atorvastatin (LIPITOR) 80 MG tablet    Sig: Take 1 tablet (80 mg total) by mouth every evening.    Dispense:  90 tablet    Refill:  3  . omeprazole (PRILOSEC) 20 MG capsule    Sig: Take 1 capsule (20 mg total) by mouth daily.    Dispense:  90 capsule    Refill:  1  . escitalopram (LEXAPRO) 20 MG tablet    Sig: Take 1 tablet (20 mg total) by mouth daily.    Dispense:  90 tablet    Refill:  1  . Doxepin HCl 3 MG TABS    Sig: Take 1 tablet (3 mg total) by mouth at bedtime as needed.    Dispense:  30 tablet    Refill:  2    Return precautions advised.  Garret Reddish, MD

## 2018-05-13 NOTE — Progress Notes (Signed)
Your CBC was normal (blood counts, infection fighting cells, platelets). Your CMET was normal (kidney, liver, and electrolytes, blood sugar).  Your cholesterol looks reasonable

## 2018-05-19 DIAGNOSIS — I8311 Varicose veins of right lower extremity with inflammation: Secondary | ICD-10-CM | POA: Diagnosis not present

## 2018-06-01 ENCOUNTER — Ambulatory Visit: Payer: Medicare HMO | Admitting: *Deleted

## 2018-06-05 DIAGNOSIS — I8311 Varicose veins of right lower extremity with inflammation: Secondary | ICD-10-CM | POA: Diagnosis not present

## 2018-06-05 DIAGNOSIS — M7981 Nontraumatic hematoma of soft tissue: Secondary | ICD-10-CM | POA: Diagnosis not present

## 2018-06-14 NOTE — Progress Notes (Signed)
Subjective:   [name] is a 77 y.o. female who presents for Medicare Annual (Subsequent) preventive examination.  Reports health as GREAT  Last seen Dr. 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  Breakfast yogurt, fruit, ice coffee Lunch - sometimes skip or eat something light Supper around 4pm  2 times a week she cooks for her dtr American chop Souy  Vegetables; broccoli   Exercise ( per Dr. Yong Channel; to exercise or go to therapy )  M-th class  Goes to the [employer] and Thurs is 2 hours; one class is yogurt and one stability  [employer] but goes there when she feels there. Rides the bike x 60 minutes  Started gardening  Friends; buddy system and accountability    There are no preventive care reminders to display for this patient.  Colonoscopy 10/2017 - no more  Mammogram 02/2018 Dexa 10/2014; normal   Educate regarding the shingrix   Still can't sleep at hs Discussed sleep etiquette  Went to [place] last summer and was ready to sleep  Other children are doing well  [employer] dtr (whom she took care of and is now 62)  (father commited suicide at 62 and the patient was 6 )  Oldest brother died for MI but was ETOH abuser and DM; depression ( lived in orphanage for awhile and she loved this )       Objective:     Vitals: BP 126/70   Pulse 73   Ht 5\' 4"  (1.626 m)   Wt 199 lb 6 oz (90.4 kg)   SpO2 97%   BMI 34.22 kg/m   Body mass index is 34.22 kg/m.  Advanced Directives 06/15/2018 06/15/2018 08/29/2012  Does Patient Have a Medical Advance Directive? Yes Yes Patient has advance directive, copy not in chart  Type of Advance Directive - - Living will  [place] in Chart? - - Copy requested from other (Comment)    Tobacco Social History   Tobacco Use  Smoking Status  Former Smoker  Smokeless Tobacco Never Used  Tobacco Comment   teenager     Counseling given: Yes Comment: teenager   Clinical Intake:     Past Medical History:  Diagnosis Date  . Anginal pain ([place]) 08/29/2012   "have had problems w/this"  . Anxiety 08/29/2012  . Arthritis 08/29/2012   right hand, Seen by Dr. Estanislado Pandy  . CAD (coronary artery disease)    nonobstructive  . Depression   . Exertional dyspnea 08/29/2012  . GERD (gastroesophageal reflux disease)   . Hammer toe    surgery x2, second with Dr. Sharol Given- only other option amputatoin  . HLD (hyperlipidemia)   . HTN (hypertension)   . PNEUMONIA 07/04/2007   Qualifier: Diagnosis of  By: Larose Kells MD, [place] Syncope and collapse 08/29/2012   "first time ever"   Past Surgical History:  Procedure Laterality Date  . ABDOMINAL HYSTERECTOMY  1970's  . APPENDECTOMY  ~ 1970  . CATARACT EXTRACTION W/ INTRAOCULAR LENS  IMPLANT, BILATERAL  ~ 2010   bilateral  . TONSILLECTOMY AND ADENOIDECTOMY  1950's  . VEIN LIGATION AND STRIPPING  2012   bilaterally   Family History  Problem Relation Age of Onset  .  Heart disease Mother        brother, father  . Diabetes Mother        sister, brother  . Suicidality Father        21  . Coronary artery disease Brother   . Colon cancer Brother   . Stroke Brother   . Alcoholism Brother   . Ovarian cancer Daughter   . Esophageal cancer Neg Hx   . Rectal cancer Neg Hx   . Stomach cancer Neg Hx   . Liver cancer Neg Hx    Social History   Socioeconomic History  . Marital status: Widowed    Spouse name: Not on file  . Number of children: 3  . Years of education: Not on file  . Highest education level: Not on file  Occupational History  . Occupation: retired  Scientific laboratory technician  . Financial resource strain: Not on file  . Food insecurity:    Worry: Not on file    Inability: Not on file  . Transportation needs:    Medical: Not on file    Non-medical: Not on file  Tobacco Use  . Smoking  status: Former Research scientist (life sciences)  . Smokeless tobacco: Never Used  . Tobacco comment: teenager  Substance and Sexual Activity  . Alcohol use: No  . Drug use: No  . Sexual activity: Never  Lifestyle  . Physical activity:    Days per week: Not on file    Minutes per session: Not on file  . Stress: Not on file  Relationships  . Social connections:    Talks on phone: Not on file    Gets together: Not on file    Attends religious service: Not on file    Active member of club or organization: Not on file    Attends meetings of clubs or organizations: Not on file    Relationship status: Not on file  Other Topics Concern  . Not on file  Social History Narrative   Widowed 11/18/13. Daughter died Apr 18, 2012. 2 living children. 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      Hobbies: gardening, time with dog, time with grandkids    Outpatient Encounter Medications as of 06/15/2018  Medication Sig  . aspirin 81 MG tablet Take 81 mg by mouth daily.   Marland Kitchen atorvastatin (LIPITOR) 80 MG tablet Take 1 tablet (80 mg total) by mouth every evening.  . diclofenac sodium (VOLTAREN) 1 % GEL Apply 3 gm to 3 large joints up to 3 times a day.Dispense 3 tubes with 3 refills.  Marland Kitchen escitalopram (LEXAPRO) 20 MG tablet Take 1 tablet (20 mg total) by mouth daily.  Marland Kitchen omeprazole (PRILOSEC) 20 MG capsule Take 1 capsule (20 mg total) by mouth daily.  . Doxepin HCl 3 MG TABS Take 1 tablet (3 mg total) by mouth at bedtime as needed. (Patient not taking: Reported on 06/15/2018)   No facility-administered encounter medications on file as of 06/15/2018.     Activities of Daily Living In your present state of health, do you have any difficulty performing the following activities: 06/15/2018  Hearing? N  Vision? N  Difficulty concentrating or making decisions? N  Walking or climbing stairs? N  Dressing or  bathing? N  Doing errands, shopping? N  Preparing Food and eating ? N  Using  the Toilet? N  In the past six months, have you accidently leaked urine? N  Do you have problems with loss of bowel control? N  Managing your Medications? N  Managing your Finances? N  Housekeeping or managing your Housekeeping? N  Some recent data might be hidden    Patient Care Team: Marin Olp, MD as PCP - General (Family Medicine)    Assessment:   This is a routine wellness examination for [employer].  Exercise Activities and Dietary recommendations Current Exercise Habits: Home exercise routine;Structured exercise class, Time (Minutes): 60, Frequency (Times/Week): 5, Weekly Exercise (Minutes/Week): 300, Intensity: Moderate  Goals    . Patient Stated     Would like to start sleeping! Will try other bedroom at home Think new thoughts and do not give up    Think about planning your July vacation with the family        Fall Risk Fall Risk  06/15/2018 08/01/2017 01/26/2017 12/01/2015  Falls in the past year? No No Yes No  Number falls in past yr: - - 2 or more -  Injury with Fall? - - Yes -  Follow up - - Education provided -  Comment - - fell trying to miss some of her new grass- instructed to be careful with her footing even if places her grass at risk -     Depression Screen PHQ 2/9 Scores 06/15/2018 05/12/2018 08/01/2017 01/26/2017  PHQ - 2 Score 2 2 0 0  PHQ- 9 Score - 8 - -     Cognitive Function MMSE - Mini Mental State Exam 06/15/2018  Not completed: (No Data)     Ad8 score reviewed for issues:  Issues making decisions:  Less interest in hobbies / activities:  Repeats questions, stories (family complaining):  Trouble using ordinary gadgets (microwave, computer, phone):  Forgets the month or year:   Mismanaging finances:   Remembering appts:  Daily problems with thinking and/or memory: Ad8 score is=0        Immunization History  Administered Date(s) Administered    . Influenza, High Dose Seasonal PF 08/24/2016  . Influenza-Unspecified 11/12/2015  . Pneumococcal Conjugate-13 08/24/2016  . Pneumococcal Polysaccharide-23 12/29/1999  . Pneumococcal-Unspecified 08/27/2010  . Td 12/28/2005, 10/26/2016    Screening Tests Health Maintenance  Topic Date Due  . INFLUENZA VACCINE  07/27/2018  . COLONOSCOPY  11/03/2020  . TETANUS/TDAP  10/26/2026  . DEXA SCAN  Completed  . PNA vac Low Risk Adult  Completed         Plan:      PCP Notes   Health Maintenance There are no preventive care reminders to display for this patient. Mammogram completed  dexa normal Left information regarding the shingrix   We spent time discussing some suggestions for better sleep    Abnormal Screens  none  Referrals  none  Patient concerns; Went to get Doxepin filled and it was to expensive  Can you try something else?   Nurse Concerns; As noted    Next PCP apt 11/17/2018       I have personally reviewed and noted the following in the patient's chart:   . Medical and social history . Use of alcohol, tobacco or illicit drugs  . Current medications and supplements . Functional ability and status . Nutritional status . Physical activity . Advanced directives . List of other physicians . Hospitalizations, surgeries, and ER visits in previous 12 months . Vitals . Screenings to include cognitive, depression, and falls . Referrals  and appointments  In addition, I have reviewed and discussed with patient certain preventive protocols, quality metrics, and best practice recommendations. A written personalized care plan for preventive services as well as general preventive health recommendations were provided to patient.     Wynetta Fines, RN  06/15/2018

## 2018-06-15 ENCOUNTER — Telehealth: Payer: Self-pay

## 2018-06-15 ENCOUNTER — Encounter: Payer: Self-pay | Admitting: *Deleted

## 2018-06-15 ENCOUNTER — Ambulatory Visit (INDEPENDENT_AMBULATORY_CARE_PROVIDER_SITE_OTHER): Payer: Medicare HMO | Admitting: *Deleted

## 2018-06-15 VITALS — BP 126/70 | HR 73 | Ht 64.0 in | Wt 199.4 lb

## 2018-06-15 DIAGNOSIS — Z0000 Encounter for general adult medical examination without abnormal findings: Secondary | ICD-10-CM

## 2018-06-15 NOTE — Progress Notes (Signed)
I have reviewed and agree with note, evaluation, plan. Would advise follow up to discuss sleep medications  Garret Reddish, MD

## 2018-06-15 NOTE — Telephone Encounter (Signed)
[name] in for AWV 06/20 Fup on sleeping medication as it was do expensive for her to pick up. Dr. Yong Channel to review and change to a new rx

## 2018-06-15 NOTE — Patient Instructions (Addendum)
[name] , Thank you for taking time to come for your Medicare Wellness Visit. I appreciate your ongoing commitment to your health goals. Please review the following plan we discussed and let me know if I can assist you in the future.   Educated to check with insurance regarding coverage of Shingles vaccination on Part D or Part B and may have lower co-pay if provided on the Part D side  Shingrix is a vaccine for the prevention of Shingles in Adults 76 and older.  If you are on Medicare, the shingrix is covered under your Part D plan, so you will take both of the vaccines in the series at your pharmacy. Please check with your benefits regarding applicable copays or out of pocket expenses.  The Shingrix is given in 2 vaccines approx 8 weeks apart. You must receive the 2nd dose prior to 6 months from receipt of the first. Please have the pharmacist print out you Immunization  dates for our office records   Prevention of falls: Remove rugs or any tripping hazards in the home Use Non slip mats in bathtubs and showers Placing grab bars next to the toilet and or shower Placing handrails on both sides of the stair way Adding extra lighting in the home.   Personal safety issues reviewed:  1. Consider starting a community watch program per [facility] 2.  Changes batteries is smoke detector and/or carbon monoxide detector  3.  If you have firearms; keep them in a safe place 4.  Wear protection when in the sun; Always wear sunscreen or a hat; It is good to have your doctor check your skin annually or review any new areas of concern 5. Driving safety; Keep in the right lane; stay 3 car lengths behind the car in front of you on the highway; look 3 times prior to pulling out; carry your cell phone everywhere you go!      These are the goals we discussed: Goals    . Patient Stated     Would like to start sleeping! Will try other bedroom at home Think new thoughts and do not give up     Think about planning your July vacation with the family        This is a list of the screening recommended for you and due dates:  Health Maintenance  Topic Date Due  . Flu Shot  07/27/2018  . Colon Cancer Screening  11/03/2020  . Tetanus Vaccine  10/26/2026  . DEXA scan (bone density measurement)  Completed  . Pneumonia vaccines  Completed      Fall Prevention in the Home Falls can cause injuries. They can happen to people of all ages. There are many things you can do to make your home safe and to help prevent falls. What can I do on the outside of my home?  Regularly fix the edges of walkways and driveways and fix any cracks.  Remove anything that might make you trip as you walk through a door, such as a raised step or threshold.  Trim any bushes or trees on the path to your home.  Use bright outdoor lighting.  Clear any walking paths of anything that might make someone trip, such as rocks or tools.  Regularly check to see if handrails are loose or broken. Make sure that both sides of any steps have handrails.  Any raised decks and porches should have guardrails on the edges.  Have any leaves, snow, or ice  cleared regularly.  Use sand or salt on walking paths during winter.  Clean up any spills in your garage right away. This includes oil or grease spills. What can I do in the bathroom?  Use night lights.  Install grab bars by the toilet and in the tub and shower. Do not use towel bars as grab bars.  Use non-skid mats or decals in the tub or shower.  If you need to sit down in the shower, use a plastic, non-slip stool.  Keep the floor dry. Clean up any water that spills on the floor as soon as it happens.  Remove soap buildup in the tub or shower regularly.  Attach bath mats securely with double-sided non-slip rug tape.  Do not have throw rugs and other things on the floor that can make you trip. What can I do in the bedroom?  Use night  lights.  Make sure that you have a light by your bed that is easy to reach.  Do not use any sheets or blankets that are too big for your bed. They should not hang down onto the floor.  Have a firm chair that has side arms. You can use this for support while you get dressed.  Do not have throw rugs and other things on the floor that can make you trip. What can I do in the kitchen?  Clean up any spills right away.  Avoid walking on wet floors.  Keep items that you use a lot in easy-to-reach places.  If you need to reach something above you, use a strong step stool that has a grab bar.  Keep electrical cords out of the way.  Do not use floor polish or wax that makes floors slippery. If you must use wax, use non-skid floor wax.  Do not have throw rugs and other things on the floor that can make you trip. What can I do with my stairs?  Do not leave any items on the stairs.  Make sure that there are handrails on both sides of the stairs and use them. Fix handrails that are broken or loose. Make sure that handrails are as long as the stairways.  Check any carpeting to make sure that it is firmly attached to the stairs. Fix any carpet that is loose or worn.  Avoid having throw rugs at the top or bottom of the stairs. If you do have throw rugs, attach them to the floor with carpet tape.  Make sure that you have a light switch at the top of the stairs and the bottom of the stairs. If you do not have them, ask someone to add them for you. What else can I do to help prevent falls?  Wear shoes that: ? Do not have high heels. ? Have rubber bottoms. ? Are comfortable and fit you well. ? Are closed at the toe. Do not wear sandals.  If you use a stepladder: ? Make sure that it is fully opened. Do not climb a closed stepladder. ? Make sure that both sides of the stepladder are locked into place. ? Ask someone to hold it for you, if possible.  Clearly mark and make sure that you can  see: ? Any grab bars or handrails. ? First and last steps. ? Where the edge of each step is.  Use tools that help you move around (mobility aids) if they are needed. These include: ? Canes. ? Walkers. ? Scooters. ? Crutches.  Turn on the lights  when you go into a dark area. Replace any light bulbs as soon as they burn out.  Set up your furniture so you have a clear path. Avoid moving your furniture around.  If any of your floors are uneven, fix them.  If there are any pets around you, be aware of where they are.  Review your medicines with your doctor. Some medicines can make you feel dizzy. This can increase your chance of falling. Ask your doctor what other things that you can do to help prevent falls. This information is not intended to replace advice given to you by your health care provider. Make sure you discuss any questions you have with your health care provider. Document Released: 10/09/2009 Document Revised: 05/20/2016 Document Reviewed: 01/17/2015 Elsevier Interactive Patient Education  2018 [facility] Maintenance, Female Adopting a healthy lifestyle and getting preventive care can go a long way to promote health and wellness. Talk with your health care provider about what schedule of regular examinations is right for you. This is a good chance for you to check in with your provider about disease prevention and staying healthy. In between checkups, there are plenty of things you can do on your own. Experts have done a lot of research about which lifestyle changes and preventive measures are most likely to keep you healthy. Ask your health care provider for more information. Weight and diet Eat a healthy diet  Be sure to include plenty of vegetables, fruits, low-fat dairy products, and lean protein.  Do not eat a lot of foods high in solid fats, added sugars, or salt.  Get regular exercise. This is one of the most important things you can do for your  health. ? Most adults should exercise for at least 150 minutes each week. The exercise should increase your heart rate and make you sweat (moderate-intensity exercise). ? Most adults should also do strengthening exercises at least twice a week. This is in addition to the moderate-intensity exercise.  Maintain a healthy weight  Body mass index (BMI) is a measurement that can be used to identify possible weight problems. It estimates body fat based on height and weight. Your health care provider can help determine your BMI and help you achieve or maintain a healthy weight.  For females 75 years of age and older: ? A BMI below 18.5 is considered underweight. ? A BMI of 18.5 to 24.9 is normal. ? A BMI of 25 to 29.9 is considered overweight. ? A BMI of 30 and above is considered obese.  Watch levels of cholesterol and blood lipids  You should start having your blood tested for lipids and cholesterol at 76 years of age, then have this test every 5 years.  You may need to have your cholesterol levels checked more often if: ? Your lipid or cholesterol levels are high. ? You are older than 76 years of age. ? You are at high risk for heart disease.  Cancer screening Lung Cancer  Lung cancer screening is recommended for adults 36-77 years old who are at high risk for lung cancer because of a history of smoking.  A yearly low-dose CT scan of the lungs is recommended for people who: ? Currently smoke. ? Have quit within the past 15 years. ? Have at least a 30-pack-year history of smoking. A pack year is smoking an average of one pack of cigarettes a day for 1 year.  Yearly screening should continue until it has been 15 years since  you quit.  Yearly screening should stop if you develop a health problem that would prevent you from having lung cancer treatment.  Breast Cancer  Practice breast self-awareness. This means understanding how your breasts normally appear and feel.  It also means  doing regular breast self-exams. Let your health care provider know about any changes, no matter how small.  If you are in your 20s or 30s, you should have a clinical breast exam (CBE) by a health care provider every 1-3 years as part of a regular health exam.  If you are 35 or older, have a CBE every year. Also consider having a breast X-ray (mammogram) every year.  If you have a family history of breast cancer, talk to your health care provider about genetic screening.  If you are at high risk for breast cancer, talk to your health care provider about having an MRI and a mammogram every year.  Breast cancer gene (BRCA) assessment is recommended for women who have family members with BRCA-related cancers. BRCA-related cancers include: ? Breast. ? Ovarian. ? Tubal. ? Peritoneal cancers.  Results of the assessment will determine the need for genetic counseling and BRCA1 and BRCA2 testing.  Cervical Cancer Your health care provider may recommend that you be screened regularly for cancer of the pelvic organs (ovaries, uterus, and vagina). This screening involves a pelvic examination, including checking for microscopic changes to the surface of your cervix (Pap test). You may be encouraged to have this screening done every 3 years, beginning at age 3.  For women ages 33-65, health care providers may recommend pelvic exams and Pap testing every 3 years, or they may recommend the Pap and pelvic exam, combined with testing for human papilloma virus (HPV), every 5 years. Some types of HPV increase your risk of cervical cancer. Testing for HPV may also be done on women of any age with unclear Pap test results.  Other health care providers may not recommend any screening for nonpregnant women who are considered low risk for pelvic cancer and who do not have symptoms. Ask your health care provider if a screening pelvic exam is right for you.  If you have had past treatment for cervical cancer or a  condition that could lead to cancer, you need Pap tests and screening for cancer for at least 20 years after your treatment. If Pap tests have been discontinued, your risk factors (such as having a new sexual partner) need to be reassessed to determine if screening should resume. Some women have medical problems that increase the chance of getting cervical cancer. In these cases, your health care provider may recommend more frequent screening and Pap tests.  Colorectal Cancer  This type of cancer can be detected and often prevented.  Routine colorectal cancer screening usually begins at 76 years of age and continues through 76 years of age.  Your health care provider may recommend screening at an earlier age if you have risk factors for colon cancer.  Your health care provider may also recommend using home test kits to check for hidden blood in the stool.  A small camera at the end of a tube can be used to examine your colon directly (sigmoidoscopy or colonoscopy). This is done to check for the earliest forms of colorectal cancer.  Routine screening usually begins at age 9.  Direct examination of the colon should be repeated every 5-10 years through 76 years of age. However, you may need to be screened more often if early  forms of precancerous polyps or small growths are found.  Skin Cancer  Check your skin from head to toe regularly.  Tell your health care provider about any new moles or changes in moles, especially if there is a change in a mole's shape or color.  Also tell your health care provider if you have a mole that is larger than the size of a pencil eraser.  Always use sunscreen. Apply sunscreen liberally and repeatedly throughout the day.  Protect yourself by wearing long sleeves, pants, a wide-brimmed hat, and sunglasses whenever you are outside.  Heart disease, diabetes, and high blood pressure  High blood pressure causes heart disease and increases the risk of stroke.  High blood pressure is more likely to develop in: ? People who have blood pressure in the high end of the normal range (130-139/85-89 mm Hg). ? People who are overweight or obese. ? People who are African American.  If you are 38-61 years of age, have your blood pressure checked every 3-5 years. If you are 39 years of age or older, have your blood pressure checked every year. You should have your blood pressure measured twice-once when you are at a hospital or clinic, and once when you are not at a hospital or clinic. Record the average of the two measurements. To check your blood pressure when you are not at a hospital or clinic, you can use: ? An automated blood pressure machine at a pharmacy. ? A home blood pressure monitor.  If you are between 30 years and 49 years old, ask your health care provider if you should take aspirin to prevent strokes.  Have regular diabetes screenings. This involves taking a blood sample to check your fasting blood sugar level. ? If you are at a normal weight and have a low risk for diabetes, have this test once every three years after 76 years of age. ? If you are overweight and have a high risk for diabetes, consider being tested at a younger age or more often. Preventing infection Hepatitis B  If you have a higher risk for hepatitis B, you should be screened for this virus. You are considered at high risk for hepatitis B if: ? You were born in a country where hepatitis B is common. Ask your health care provider which countries are considered high risk. ? Your parents were born in a high-risk country, and you have not been immunized against hepatitis B (hepatitis B vaccine). ? You have HIV or AIDS. ? You use needles to inject street drugs. ? You live with someone who has hepatitis B. ? You have had sex with someone who has hepatitis B. ? You get hemodialysis treatment. ? You take certain medicines for conditions, including cancer, organ transplantation, and  autoimmune conditions.  Hepatitis C  Blood testing is recommended for: ? Everyone born from [birth year] through 1964. ? Anyone with known risk factors for hepatitis C.  Sexually transmitted infections (STIs)  You should be screened for sexually transmitted infections (STIs) including gonorrhea and chlamydia if: ? You are sexually active and are younger than 76 years of age. ? You are older than 76 years of age and your health care provider tells you that you are at risk for this type of infection. ? Your sexual activity has changed since you were last screened and you are at an increased risk for chlamydia or gonorrhea. Ask your health care provider if you are at risk.  If you do not have HIV,  but are at risk, it may be recommended that you take a prescription medicine daily to prevent HIV infection. This is called pre-exposure prophylaxis (PrEP). You are considered at risk if: ? You are sexually active and do not regularly use condoms or know the HIV status of your partner(s). ? You take drugs by injection. ? You are sexually active with a partner who has HIV.  Talk with your health care provider about whether you are at high risk of being infected with HIV. If you choose to begin PrEP, you should first be tested for HIV. You should then be tested every 3 months for as long as you are taking PrEP. Pregnancy  If you are premenopausal and you may become pregnant, ask your health care provider about preconception counseling.  If you may become pregnant, take 400 to 800 micrograms (mcg) of folic acid every day.  If you want to prevent pregnancy, talk to your health care provider about birth control (contraception). Osteoporosis and menopause  Osteoporosis is a disease in which the bones lose minerals and strength with aging. This can result in serious bone fractures. Your risk for osteoporosis can be identified using a bone density scan.  If you are 26 years of age or older, or if you are at  risk for osteoporosis and fractures, ask your health care provider if you should be screened.  Ask your health care provider whether you should take a calcium or vitamin D supplement to lower your risk for osteoporosis.  Menopause may have certain physical symptoms and risks.  Hormone replacement therapy may reduce some of these symptoms and risks. Talk to your health care provider about whether hormone replacement therapy is right for you. Follow these instructions at home:  Schedule regular health, dental, and eye exams.  Stay current with your immunizations.  Do not use any tobacco products including cigarettes, chewing tobacco, or electronic cigarettes.  If you are pregnant, do not drink alcohol.  If you are breastfeeding, limit how much and how often you drink alcohol.  Limit alcohol intake to no more than 1 drink per day for nonpregnant women. One drink equals 12 ounces of beer, 5 ounces of wine, or 1 ounces of hard liquor.  Do not use street drugs.  Do not share needles.  Ask your health care provider for help if you need support or information about quitting drugs.  Tell your health care provider if you often feel depressed.  Tell your health care provider if you have ever been abused or do not feel safe at home. This information is not intended to replace advice given to you by your health care provider. Make sure you discuss any questions you have with your health care provider. Document Released: 06/28/2011 Document Revised: 05/20/2016 Document Reviewed: 09/16/2015 Elsevier Interactive Patient Education  [employer].

## 2018-06-20 NOTE — Telephone Encounter (Signed)
06/25  Tried to outreach regarding fup apt for sleep medication with Dr. Yong Channel. Lines are busy [employer]

## 2018-06-27 NOTE — Telephone Encounter (Signed)
Call back to Ms Tribbey, LEFT VM  Requested she call Dr. Yong Channel at [facility] at [phone] to make an apt to discuss her sleep issues and to review for other meds.  Wynetta Fines RN

## 2018-07-20 ENCOUNTER — Telehealth: Payer: Self-pay

## 2018-07-20 NOTE — Telephone Encounter (Signed)
fup to [facility] Call to [name] to leave a VM  That Dr. Yong Channel would be happy to discuss other sleep meds at a fup apt.  Wynetta Fines RN

## 2018-07-21 NOTE — Telephone Encounter (Signed)
Call back to remind her to make an apt with Dr. Yong Channel to discuss sleep aide To call me directly if she would like at [phone].  Wynetta Fines RN

## 2018-09-01 NOTE — Progress Notes (Signed)
Office Visit Note  Patient: [name]             Date of Birth: [birth year]           MRN: [id number]             PCP: Marin Olp, MD Referring: Marin Olp, MD Visit Date: 09/15/2018 Occupation: @GUAROCC @  Subjective:  Bilateral CMC joint pain   History of Present Illness: [name] is a 77 y.o. female with history of osteoarthritis.  She presents today with bilateral CMC joint pain.  She states after her last vitis she went to a recommended medical supply store who provided bilateral wrist splints instead of CMC joint braces.  She did not notice any benefit wearing the braces since they are so rigid.  She states the pain has been more severe, especially when gripping.  She has been using voltaren gel and performing hand exercises several times per week. She tried hemp cream, but she did not notice any benefit after trying it. In the past, she has has a right CMC joint cortisone injection which provided good relief. She also reports an area on the medial aspect of the right lower extremity that is tender and warm.  She states she has had this area of tenderness and warmth for several years.  She denies any ankle pain or difficulty with ankle ROM.  She denies any knee or feet pain at this time.  She continues to go to [employer] 4 days per week.    Activities of Daily Living:  Patient reports morning stiffness for 60 minutes.   Patient Denies nocturnal pain.  Difficulty dressing/grooming: Denies Difficulty climbing stairs: Reports Difficulty getting out of chair: Denies Difficulty using hands for taps, buttons, cutlery, and/or writing: Denies  Review of Systems  Constitutional: Negative for fatigue.  HENT: Positive for mouth dryness.   Eyes: Negative for dryness.  Respiratory: Negative for cough and shortness of breath.   Cardiovascular: Negative for chest pain, palpitations, hypertension and swelling in legs/feet.  Gastrointestinal: Negative for  blood in stool, constipation and diarrhea.  Musculoskeletal: Positive for arthralgias, joint pain, joint swelling, myalgias, muscle weakness, morning stiffness and myalgias. Negative for muscle tenderness.  Skin: Negative for rash, hair loss and sensitivity to sunlight.  Neurological: Positive for weakness. Negative for dizziness, numbness and headaches.  Psychiatric/Behavioral: Positive for sleep disturbance. Negative for depressed mood.    PMFS History:  Patient Active Problem List   Diagnosis Date Noted  . 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 Trigger finger, right middle finger 09/05/2017  . History of adenomatous polyp of colon 08/01/2017  . Primary osteoarthritis of both knees 03/10/2017  . Primary osteoarthritis of both hands 03/09/2017  . Primary osteoarthritis of both feet 03/09/2017  . Rheumatoid factor positive 03/09/2017  . Advanced directives, counseling/discussion 01/26/2017  . nonobstructive CAD (coronary artery disease) 12/01/2015  . 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 Insomnia 12/01/2015  . Glaucoma 12/01/2015  . Memory loss 12/01/2015  . Hammer toe   . Arthritis 08/29/2012  . GERD 09/06/2009  . Edema 09/06/2009  . History of syncope 09/06/2009  . Hyperlipidemia 05/19/2007  . Moderate major depression ([place]) 05/19/2007  . Essential hypertension 05/19/2007  . Hyperglycemia 05/19/2007    Past Medical History:  Diagnosis Date  . Anginal pain ([place]) 08/29/2012   "have had problems w/this"  . Anxiety 08/29/2012  . Arthritis 08/29/2012   right hand, Seen by Dr. Estanislado Pandy  . CAD (coronary  artery disease)    nonobstructive  . Depression   . Exertional dyspnea 08/29/2012  . GERD (gastroesophageal reflux disease)   . Hammer toe    surgery x2, second with Dr. Sharol Given- only other option amputatoin  . HLD (hyperlipidemia)   . HTN (hypertension)   . PNEUMONIA 07/04/2007   Qualifier: Diagnosis of  By: Larose Kells MD, [place] Syncope and collapse 08/29/2012   "first time ever"      Family History  Problem Relation Age of Onset  . Heart disease Mother        brother, father  . Diabetes Mother        sister, brother  . Suicidality Father        10  . Coronary artery disease Brother   . Colon cancer Brother   . Stroke Brother   . Alcoholism Brother   . Ovarian cancer Daughter   . Esophageal cancer Neg Hx   . Rectal cancer Neg Hx   . Stomach cancer Neg Hx   . Liver cancer Neg Hx    Past Surgical History:  Procedure Laterality Date  . ABDOMINAL HYSTERECTOMY  1970's  . APPENDECTOMY  ~ 1970  . CATARACT EXTRACTION W/ INTRAOCULAR LENS  IMPLANT, BILATERAL  ~ 2009/04/01   bilateral  . TONSILLECTOMY AND ADENOIDECTOMY  1950's  . VEIN LIGATION AND STRIPPING  04-02-2011   bilaterally   Social History   Social History Narrative   Widowed 01-Nov-2013. Daughter died 2012/04/01. 2 living children. 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 She appears well-developed and well-nourished.  HENT:  Head: Normocephalic and atraumatic.  Eyes: Conjunctivae and EOM are normal.  Neck: Normal range of motion.  Cardiovascular: Normal rate, regular rhythm, normal heart sounds and intact distal pulses.  Pulmonary/Chest: Effort normal and breath sounds normal.  Abdominal: Soft. Bowel sounds are normal.  Lymphadenopathy:    She has no cervical adenopathy.  Neurological: She is alert and oriented to person, place, and time.  Skin: Skin is warm and dry. Capillary refill takes less than 2 seconds.  Psychiatric: She has a normal mood and affect. Her  behavior is normal.  Nursing note and vitals reviewed.    Musculoskeletal Exam: C-spine, thoracic spine, and lumbar spine good ROM.  No midline spinal tenderness.  No SI joint tenderness.  Shoulder joints, elbow joints, wrist joints, MCPs, PIPs, and DIPs good ROM with no synovitis.  She has PIP and DIP synovial thickening.  Bilateral CMC joint synovial thickening and tenderness noted.  Hip joints, knee joints, ankle joints, MTPs, PIPs, and DIPs good ROM with no synovitis.  No warmth or effusion of knee joints.  No tenderness of trochanteric bursa bilaterally.  PIP and DIP synovial thickening consistent with osteoarthritis of both feet.   CDAI Exam: CDAI Score: Not documented Patient Global Assessment: Not documented; Provider Global Assessment: Not documented Swollen: Not documented; Tender: Not documented Joint  Exam   Not documented   There is currently no information documented on the homunculus. Go to the Rheumatology activity and complete the homunculus joint exam.  Investigation: No additional findings.  Imaging: No results found.  Recent Labs: Lab Results  Component Value Date   WBC 5.0 05/12/2018   HGB 13.7 05/12/2018   PLT 234.0 05/12/2018   NA 141 05/12/2018   K 4.4 05/12/2018   CL 104 05/12/2018   CO2 30 05/12/2018   GLUCOSE 91 05/12/2018   BUN 14 05/12/2018   CREATININE 0.78 05/12/2018   BILITOT 1.1 08/01/2017   ALKPHOS 69 08/01/2017   AST 19 08/01/2017   ALT 16 08/01/2017   PROT 6.5 08/01/2017   ALBUMIN 4.1 08/01/2017   CALCIUM 9.2 05/12/2018   GFRAA 83 (L) 08/30/2012    Speciality Comments: No specialty comments available.  Procedures:  Small Joint Inj: bilateral thumb CMC on 09/15/2018 12:35 PM Indications: pain Details: 27 G needle, ultrasound-guided radial approach Medications (Right): 0.5 mL lidocaine 1 %; 10 mg triamcinolone acetonide 40 MG/ML Aspirate (Right): 0 mL Medications (Left): 0.5 mL lidocaine 1 %; 10 mg triamcinolone acetonide 40  MG/ML Aspirate (Left): 0 mL Procedure, treatment alternatives, risks and benefits explained, specific risks discussed. Consent was given by the patient. Immediately prior to procedure a time out was called to verify the correct patient, procedure, equipment, support staff and site/side marked as required. Patient was prepped and draped in the usual sterile fashion.     Allergies: Clindamycin/lincomycin and Trazodone and nefazodone   Assessment / Plan:     Visit Diagnoses: Primary osteoarthritis of both hands: She has PIP and DIP synovial thickening consistent with osteoarthritis.  She has bilateral CMC joint synovial thickening and tenderness on exam.  She has been having increased pain in both CMC joints.  She has been using Voltaren gel topically.  A refill of Voltaren gel was sent to the pharmacy.  She was given a prescription for bilateral CMC joint braces.  She requested bilateral CMC joint cortisone injections.  She tolerated the procedure well.  Potential side effects were discussed.  She was advised to monitor blood pressure closely.  Joint protection and muscle strengthening were discussed.  She will continue performing hand exercises on a regular basis at yoga.    Primary osteoarthritis of both knees: No warmth or effusion of knee joints.  She has no discomfort at this time.  She has good ROM on exam.   Primary osteoarthritis of both feet: She has PIP and DIP synovial thickening.  She has no discomfort at this time.   Trigger finger, right middle finger - Resolved.   Rheumatoid factor positive - RF 19, anti-CCP negative  Other medical conditions are listed as follows:   Primary insomnia  History of depression  History of coronary artery disease  History of gastroesophageal reflux (GERD)  History of hyperlipidemia  History of hypertension  History of adenomatous polyp of colon   Orders: Orders Placed This Encounter  Procedures  . Small Joint Inj   Meds ordered this  encounter  Medications  . diclofenac sodium (VOLTAREN) 1 % GEL    Sig: Apply 3 gm to 3 large joints up to 3 times a day.Dispense 3 tubes with 3 refills.    Dispense:  3 Tube    Refill:  3    Face-to-face time spent with patient was 30 minutes. Greater than 50% of time was spent in counseling and coordination of care.  Follow-Up Instructions: Return in  about 6 months (around 03/16/2019) for Osteoarthritis, Polymyositis.   Ofilia Neas, PA-C   I examined and evaluated the patient with Hazel Sams PA.  Patient has significant discomfort over bilateral CMC joints on my examination today.  Per her request I injected bilateral CMC joints with cortisone under ultrasound guidance.  And muscle strengthening exercise were discussed.  She was also given her prescription for [facility] braces.  The plan of care was discussed as noted above.  Bo Merino, MD  Note - This record has been created using Editor, commissioning.  Chart creation errors have been sought, but may not always  have been located. Such creation errors do not reflect on  the standard of medical care.

## 2018-09-15 ENCOUNTER — Telehealth: Payer: Self-pay | Admitting: *Deleted

## 2018-09-15 ENCOUNTER — Ambulatory Visit: Payer: Medicare HMO | Admitting: Rheumatology

## 2018-09-15 ENCOUNTER — Encounter: Payer: Self-pay | Admitting: Rheumatology

## 2018-09-15 VITALS — BP 123/67 | HR 61 | Ht 65.0 in | Wt 198.0 lb

## 2018-09-15 DIAGNOSIS — M19071 Primary osteoarthritis, right ankle and foot: Secondary | ICD-10-CM

## 2018-09-15 DIAGNOSIS — M180 Bilateral primary osteoarthritis of first carpometacarpal joints: Secondary | ICD-10-CM

## 2018-09-15 DIAGNOSIS — R7689 Other specified abnormal immunological findings in serum: Secondary | ICD-10-CM

## 2018-09-15 DIAGNOSIS — R69 Illness, unspecified: Secondary | ICD-10-CM | POA: Diagnosis not present

## 2018-09-15 DIAGNOSIS — M19041 Primary osteoarthritis, right hand: Secondary | ICD-10-CM

## 2018-09-15 DIAGNOSIS — Z860101 Personal history of adenomatous and serrated colon polyps: Secondary | ICD-10-CM

## 2018-09-15 DIAGNOSIS — M1811 Unilateral primary osteoarthritis of first carpometacarpal joint, right hand: Secondary | ICD-10-CM

## 2018-09-15 DIAGNOSIS — Z8659 Personal history of other mental and behavioral disorders: Secondary | ICD-10-CM

## 2018-09-15 DIAGNOSIS — M65331 Trigger finger, right middle finger: Secondary | ICD-10-CM | POA: Diagnosis not present

## 2018-09-15 DIAGNOSIS — Z86010 Personal history of colonic polyps: Secondary | ICD-10-CM

## 2018-09-15 DIAGNOSIS — Z8719 Personal history of other diseases of the digestive system: Secondary | ICD-10-CM | POA: Diagnosis not present

## 2018-09-15 DIAGNOSIS — F5101 Primary insomnia: Secondary | ICD-10-CM

## 2018-09-15 DIAGNOSIS — Z8679 Personal history of other diseases of the circulatory system: Secondary | ICD-10-CM

## 2018-09-15 DIAGNOSIS — Z8639 Personal history of other endocrine, nutritional and metabolic disease: Secondary | ICD-10-CM

## 2018-09-15 DIAGNOSIS — R768 Other specified abnormal immunological findings in serum: Secondary | ICD-10-CM | POA: Diagnosis not present

## 2018-09-15 DIAGNOSIS — M19042 Primary osteoarthritis, left hand: Secondary | ICD-10-CM

## 2018-09-15 DIAGNOSIS — M170 Bilateral primary osteoarthritis of knee: Secondary | ICD-10-CM

## 2018-09-15 DIAGNOSIS — M19072 Primary osteoarthritis, left ankle and foot: Secondary | ICD-10-CM

## 2018-09-15 DIAGNOSIS — M1812 Unilateral primary osteoarthritis of first carpometacarpal joint, left hand: Secondary | ICD-10-CM

## 2018-09-15 MED ORDER — TRIAMCINOLONE ACETONIDE 40 MG/ML IJ SUSP
10.0000 mg | INTRAMUSCULAR | Status: AC | PRN
Start: 2018-09-15 — End: 2018-09-15
  Administered 2018-09-15: 10 mg via INTRA_ARTICULAR

## 2018-09-15 MED ORDER — DICLOFENAC SODIUM 1 % TD GEL: TRANSDERMAL | 3 refills | Status: AC

## 2018-09-15 MED ORDER — LIDOCAINE HCL 1 % IJ SOLN
0.5000 mL | INTRAMUSCULAR | Status: AC | PRN
Start: 2018-09-15 — End: 2018-09-15
  Administered 2018-09-15: .5 mL

## 2018-09-15 MED ORDER — LIDOCAINE HCL 1 % IJ SOLN
0.5000 mL | INTRAMUSCULAR | Status: AC | PRN
  Administered 2018-09-15: .5 mL

## 2018-09-15 MED ORDER — TRIAMCINOLONE ACETONIDE 40 MG/ML IJ SUSP
10.0000 mg | INTRAMUSCULAR | Status: AC | PRN
  Administered 2018-09-15: 10 mg via INTRA_ARTICULAR

## 2018-09-15 NOTE — Telephone Encounter (Signed)
Prior authorization for Voltaren Gel submitted via cover my meds. Will update with response.

## 2018-09-18 NOTE — Telephone Encounter (Signed)
Prior Authorization approved for Voltaren Gel approved. Patient advised.

## 2018-09-27 ENCOUNTER — Telehealth: Payer: Self-pay | Admitting: Family Medicine

## 2018-09-27 NOTE — Telephone Encounter (Signed)
Copied from [place] [id number]. Topic: Quick Communication - See Telephone Encounter >> Sep 27, 2018  4:51 PM Percell Belt A wrote: CRM for notification. See Telephone encounter for: 09/27/18. Pt called in and stated that she would like to know if she could increase her omeprazole (PRILOSEC) 20 MG capsule [[id number]] to 2 tabs daily, one in the morning and one at night.  She is having bad heart burn around dinner time   Pharmacy is [facility] on [address]  Best call back number  [phone]

## 2018-09-28 ENCOUNTER — Encounter: Payer: Self-pay | Admitting: Family Medicine

## 2018-09-28 ENCOUNTER — Ambulatory Visit (INDEPENDENT_AMBULATORY_CARE_PROVIDER_SITE_OTHER): Payer: Medicare HMO

## 2018-09-28 DIAGNOSIS — Z23 Encounter for immunization: Secondary | ICD-10-CM

## 2018-09-28 NOTE — Progress Notes (Signed)
Patient came into the office today to receive her flu vaccine. Vaccine given in patient's left deltoid and patient tolerated injection well. No signs/symptoms of a reaction prior to patient leaving the exam room. VIS given to patient. Patient made aware that she has already received her Pneumonia vaccines.

## 2018-09-28 NOTE — Telephone Encounter (Signed)
Yes thanks, you may send in an rx for omeprazole 40mg  daily. Schedule a follow up within a month to check in on her progress with this regimen. If she has new or worsening symptoms return to see [place] sooner

## 2018-09-29 ENCOUNTER — Telehealth: Payer: Self-pay | Admitting: Family Medicine

## 2018-09-29 MED ORDER — OMEPRAZOLE 20 MG PO CPDR: 20.0000 mg | DELAYED_RELEASE_CAPSULE | Freq: Two times a day (BID) | ORAL | 1 refills | Status: DC

## 2018-09-29 NOTE — Telephone Encounter (Signed)
Dr Yong Channel, I called and spoke with patient. She states she would like to take 20mg  in the morning and 20mg  in the afternoon. Please advise. Appointment scheduled.

## 2018-09-29 NOTE — Addendum Note (Signed)
Addended by: Williemae Area on: 09/29/2018 01:07 PM   Modules accepted: Orders

## 2018-09-29 NOTE — Telephone Encounter (Signed)
See note

## 2018-09-29 NOTE — Telephone Encounter (Signed)
Copied from [place] [id number]. Topic: Quick Communication - See Telephone Encounter >> Sep 29, 2018  4:08 PM Blase Mess A wrote: CRM for notification. See Telephone encounter for: 09/29/18. Patient is calling omeprazole (PRILOSEC) 20 MG capsule [[id number]] the insurance will not approve 2x a day so she is going to cancel.  She said that she belives that the mg will have to be increased. However, she will discuss with the dr at the appt.

## 2018-09-29 NOTE — Telephone Encounter (Signed)
May send in 20 mg twice daily if insurance covers this.

## 2018-10-05 ENCOUNTER — Telehealth: Payer: Self-pay

## 2018-10-05 NOTE — Telephone Encounter (Signed)
Received an approval of Prior Authorization for omeprazole (PRILOSEC) 20 MG capsule effective through 12/27/2019

## 2018-10-05 NOTE — Telephone Encounter (Signed)
Prior Authorization submitted for omeprazole (PRILOSEC) 20 MG capsule. Pending approval at this time

## 2018-10-09 NOTE — Telephone Encounter (Signed)
From 09/28/18 before she requested split dosing  "Yes thanks, you may send in an rx for omeprazole 40mg  daily. Schedule a follow up within a month to check in on her progress with this regimen. If she has new or worsening symptoms return to see [place] sooner "  Lets go back to prior plan- send in omeprazole 40mg  for her please #30 with 5 refills

## 2018-10-09 NOTE — Telephone Encounter (Signed)
Dr Yong Channel, [facility]. Please advise if any further instructions.

## 2018-10-10 MED ORDER — OMEPRAZOLE 40 MG PO CPDR: 40.0000 mg | DELAYED_RELEASE_CAPSULE | Freq: Every day | ORAL | 5 refills | Status: DC

## 2018-10-10 NOTE — Telephone Encounter (Signed)
Left message requesting call back. Will order medication per Dr Ronney Lion instruction.

## 2018-10-10 NOTE — Addendum Note (Signed)
Addended by: Williemae Area on: 10/10/2018 10:52 AM   Modules accepted: Orders

## 2018-10-12 DIAGNOSIS — I83811 Varicose veins of right lower extremities with pain: Secondary | ICD-10-CM | POA: Diagnosis not present

## 2018-10-12 DIAGNOSIS — I8311 Varicose veins of right lower extremity with inflammation: Secondary | ICD-10-CM | POA: Diagnosis not present

## 2018-10-12 DIAGNOSIS — I83893 Varicose veins of bilateral lower extremities with other complications: Secondary | ICD-10-CM | POA: Diagnosis not present

## 2018-10-18 DIAGNOSIS — I8311 Varicose veins of right lower extremity with inflammation: Secondary | ICD-10-CM | POA: Diagnosis not present

## 2018-10-27 ENCOUNTER — Encounter: Payer: Self-pay | Admitting: Family Medicine

## 2018-10-27 ENCOUNTER — Ambulatory Visit (INDEPENDENT_AMBULATORY_CARE_PROVIDER_SITE_OTHER): Payer: Medicare HMO | Admitting: Family Medicine

## 2018-10-27 ENCOUNTER — Ambulatory Visit: Payer: Medicare HMO | Admitting: Family Medicine

## 2018-10-27 VITALS — BP 120/82 | HR 67 | Temp 97.8°F | Ht 65.0 in | Wt 197.0 lb

## 2018-10-27 DIAGNOSIS — E785 Hyperlipidemia, unspecified: Secondary | ICD-10-CM | POA: Diagnosis not present

## 2018-10-27 DIAGNOSIS — T63481A Toxic effect of venom of other arthropod, accidental (unintentional), initial encounter: Secondary | ICD-10-CM

## 2018-10-27 LAB — COMPREHENSIVE METABOLIC PANEL
ALBUMIN: 4.2 g/dL (ref 3.5–5.2)
ALK PHOS: 67 U/L (ref 39–117)
ALT: 21 U/L (ref 0–35)
AST: 24 U/L (ref 0–37)
BUN: 19 mg/dL (ref 6–23)
CO2: 28 mEq/L (ref 19–32)
Calcium: 9.4 mg/dL (ref 8.4–10.5)
Chloride: 102 mEq/L (ref 96–112)
Creatinine, Ser: 0.8 mg/dL (ref 0.40–1.20)
GFR: 73.97 mL/min (ref >60.00)
Glucose, Bld: 86 mg/dL (ref 70–99)
Potassium: 4.2 mEq/L (ref 3.5–5.1)
SODIUM: 139 meq/L (ref 135–145)
Total Bilirubin: 0.8 mg/dL (ref 0.2–1.2)
Total Protein: 6.9 g/dL (ref 6.0–8.3)

## 2018-10-27 LAB — CBC
HCT: 41.7 % (ref 36.0–46.0)
Hemoglobin: 14.3 g/dL (ref 12.0–15.0)
MCHC: 34.3 g/dL (ref 30.0–36.0)
MCV: 88.2 fl (ref 78.0–100.0)
Platelets: 255 10*3/uL (ref 150.0–400.0)
RBC: 4.73 Mil/uL (ref 3.87–5.11)
RDW: 14.1 % (ref 11.5–15.5)
WBC: 6.7 10*3/uL (ref 4.0–10.5)

## 2018-10-27 LAB — LIPID PANEL
CHOLESTEROL: 167 mg/dL (ref 0–200)
HDL: 72.3 mg/dL (ref >39.00)
LDL Cholesterol: 78 mg/dL (ref 0–99)
NonHDL: 94.33
Total CHOL/HDL Ratio: 2
Triglycerides: 81 mg/dL (ref 0.0–149.0)
VLDL: 16.2 mg/dL (ref 0.0–40.0)

## 2018-10-27 LAB — TSH: TSH: 2.5 u[IU]/mL (ref 0.35–4.50)

## 2018-10-27 MED ORDER — MUPIROCIN 2 % EX OINT: 1.0000 "application " | TOPICAL_OINTMENT | Freq: Two times a day (BID) | CUTANEOUS | 0 refills | Status: DC

## 2018-10-27 NOTE — Patient Instructions (Signed)
You had a severe local reaction to insect sting. Glad spot on the ankle is doing ok. The buttocks area had an ulceration that is now scabbing over. Mildly red along the borders- lets have you use topical antibiotic for 7 days just to be on safe side but I think this is currently healing. Can recheck in 3 weeks at physical or sooner if symptoms worsen such as it getting larger, redder around it, more pain or you get a a fever  Please stop by lab before you go We are doing labs today for your annual visit in 3 weeks. Thank you for fasting

## 2018-10-27 NOTE — Progress Notes (Signed)
Subjective:  [name] is a 77 y.o. year old very pleasant female patient who presents for/with See problem oriented charting ROS-no lip or tongue swelling.  No shortness of breath or chest pain.  Does have some pruritus on right ankle  Past Medical History-  Patient Active Problem List   Diagnosis Date Noted  . Advanced directives, counseling/discussion 01/26/2017    Priority: High  . nonobstructive CAD (coronary artery disease) 12/01/2015    Priority: High  . Memory loss 12/01/2015    Priority: High  . History of adenomatous polyp of colon 08/01/2017    Priority: Medium  . Varicose veins of both lower extremities 12/01/2015    Priority: Medium  . Hyperlipidemia 05/19/2007    Priority: Medium  . Moderate major depression ([place]) 05/19/2007    Priority: Medium  . Essential hypertension 05/19/2007    Priority: Medium  . Insomnia 12/01/2015    Priority: Low  . Glaucoma 12/01/2015    Priority: Low  . Hammer toe     Priority: Low  . Arthritis 08/29/2012    Priority: Low  . GERD 09/06/2009    Priority: Low  . Edema 09/06/2009    Priority: Low  . History of syncope 09/06/2009    Priority: Low  . Hyperglycemia 05/19/2007    Priority: Low  . 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 Trigger finger, right middle finger 09/05/2017  . Primary osteoarthritis of both knees 03/10/2017  . Primary osteoarthritis of both hands 03/09/2017  . Primary osteoarthritis of both feet 03/09/2017  . Rheumatoid factor positive 03/09/2017    Medications- reviewed and updated Current Outpatient Medications  Medication Sig Dispense Refill  . aspirin 81 MG tablet Take 81 mg by mouth daily.     Marland Kitchen atorvastatin (LIPITOR) 80 MG tablet Take 1 tablet (80 mg total) by mouth every evening. 90 tablet 3  . diclofenac sodium (VOLTAREN) 1 % GEL Apply 3 gm to 3 large joints up to 3 times a day.Dispense 3 tubes with 3 refills. 3 Tube 3  . escitalopram (LEXAPRO) 20 MG tablet Take 1 tablet (20 mg total) by mouth daily.  90 tablet 1  . omeprazole (PRILOSEC) 40 MG capsule Take 1 capsule (40 mg total) by mouth daily. (Patient taking differently: Take 20 mg by mouth 2 (two) times daily. ) 30 capsule 5  . mupirocin ointment (BACTROBAN) 2 % Apply 1 application topically 2 (two) times daily. To spot on buttocks for 7 days 22 g 0   No current facility-administered medications for this visit.    Immunization History  Administered Date(s) Administered  . Influenza, High Dose Seasonal PF 08/24/2016, 09/28/2018  . Influenza-Unspecified 11/12/2015  . Pneumococcal Conjugate-13 08/24/2016  . Pneumococcal Polysaccharide-23 12/29/1999  . Pneumococcal-Unspecified 08/27/2010  . Td 12/28/2005, 10/26/2016    Objective: BP 120/82 (BP Location: Left Arm, Patient Position: Sitting, Cuff Size: Large)   Pulse 67   Temp 97.8 F (36.6 C) (Oral)   Ht 5\' 5"  (1.651 m)   Wt 197 lb (89.4 kg)   SpO2 95%   BMI 32.78 kg/m  Gen: NAD, resting comfortably CV: RRR no murmurs rubs or gallops Lungs: CTAB no crackles, wheeze, rhonchi Ext: no edema Skin: warm, dry, very slight erythematous area on right ankle.  On the right buttocks there is a 2 x 1 cm ulceration that has now scabbed over superficially.  Mild erythema around this.  Also has 2 erythematous areas just above this with some excoriation.  Assessment/Plan:  Local reaction to insect sting, accidental  or unintentional, initial encounter S: Patient was working in her garden last Friday.  She noted a bee on her right ankle-it stung her but she was able to swipe it away.  Next thing she knew she felt a staying in her right buttocks area.  She states she has a high tolerance for pain.  She states she has had very mild tenderness in the buttocks since that time.  She has had itching on the right ankle and is continued to scratch that it.  She noted an open patch of skin on right buttocks-this has now healed over.  She was worried about this getting infected going into the weekend. A/P:  I think this is primarily a local reaction to the sting.  Looks like she had a small ulceration which is now healing over.  She does have some mild erythema around the borders.  We will put her on some topical mupirocin though I told her I did not think this was likely infectious-more likely inflammatory.  Return precautions given.  Hyperlipidemia, unspecified hyperlipidemia type - Plan: CBC, Comprehensive metabolic panel, Lipid panel, TSH -Patient has physical in 3 weeks.  She asks about doing labs beforehand.  She is fasting today and would like to complete these.  I ordered all labs under hyperlipidemia.  She has a family history of some thyroid issues and request TSH specifically-reasonable given hyperlipidemia.  Future Appointments  Date Time Provider [place]  11/17/2018 10:00 AM Marin Olp, MD [facility]  03/16/2019  9:30 AM Bo Merino, MD [facility] None  06/21/2019  1:00 PM [facility] COACH [facility]   Lab/Order associations: FASTING Local reaction to insect sting, accidental or unintentional, initial encounter  Hyperlipidemia, unspecified hyperlipidemia type - Plan: CBC, Comprehensive metabolic panel, Lipid panel, TSH  Meds ordered this encounter  Medications  . mupirocin ointment (BACTROBAN) 2 %    Sig: Apply 1 application topically 2 (two) times daily. To spot on buttocks for 7 days    Dispense:  22 g    Refill:  0    Return precautions advised.  Garret Reddish, MD

## 2018-11-06 ENCOUNTER — Other Ambulatory Visit: Payer: Self-pay | Admitting: Family Medicine

## 2018-11-17 ENCOUNTER — Ambulatory Visit (INDEPENDENT_AMBULATORY_CARE_PROVIDER_SITE_OTHER): Payer: Medicare HMO | Admitting: Family Medicine

## 2018-11-17 ENCOUNTER — Encounter: Payer: Self-pay | Admitting: Family Medicine

## 2018-11-17 VITALS — BP 118/88 | HR 74 | Temp 97.9°F | Ht 65.0 in | Wt 197.0 lb

## 2018-11-17 DIAGNOSIS — E785 Hyperlipidemia, unspecified: Secondary | ICD-10-CM

## 2018-11-17 DIAGNOSIS — R413 Other amnesia: Secondary | ICD-10-CM | POA: Diagnosis not present

## 2018-11-17 DIAGNOSIS — I10 Essential (primary) hypertension: Secondary | ICD-10-CM | POA: Diagnosis not present

## 2018-11-17 DIAGNOSIS — Z6832 Body mass index (BMI) 32.0-32.9, adult: Secondary | ICD-10-CM | POA: Diagnosis not present

## 2018-11-17 DIAGNOSIS — K219 Gastro-esophageal reflux disease without esophagitis: Secondary | ICD-10-CM

## 2018-11-17 DIAGNOSIS — I2510 Atherosclerotic heart disease of native coronary artery without angina pectoris: Secondary | ICD-10-CM | POA: Diagnosis not present

## 2018-11-17 DIAGNOSIS — R69 Illness, unspecified: Secondary | ICD-10-CM | POA: Diagnosis not present

## 2018-11-17 DIAGNOSIS — Z0000 Encounter for general adult medical examination without abnormal findings: Secondary | ICD-10-CM | POA: Diagnosis not present

## 2018-11-17 DIAGNOSIS — F325 Major depressive disorder, single episode, in full remission: Secondary | ICD-10-CM | POA: Diagnosis not present

## 2018-11-17 NOTE — Assessment & Plan Note (Signed)
Hyperlipidemia- has been controlled on atorvastatin 80 mg-updated 3 weeks ago -Being aggressive with history of nonobstructive coronary artery disease- max dose statin.  Continue aspirin as well

## 2018-11-17 NOTE — Assessment & Plan Note (Signed)
GERD- on omeprazole 40 mg (actually 20 mg BID- was getting breakthrough on 40 once a day and 20 once a day- doin gmuch better now)-no recent B12 level and with history of PPI use and memory loss will get B12 level with next labs

## 2018-11-17 NOTE — Assessment & Plan Note (Signed)
Memory loss-update MMSE today.  Last MMSE in 2016 was 27 out of 30- today improved to 29/30- so will continue to monitor every few years or sooner if she notes worsening

## 2018-11-17 NOTE — Patient Instructions (Addendum)
Thrilled you are doing so well! Keep up the great work

## 2018-11-17 NOTE — Assessment & Plan Note (Signed)
Depression- remains on Lexapro 20 mg.  PHQ 9 of 3- doing great- full remission!   Still struggles with loss of her husband and daughter She is still using gardening as therapy.  Still doing yoga 4 times a week and loves socializing there.  Have encouraged [place] [facility] visit- hasnt needed as doing well  Trial of doxepin for sleep last visit but was too expensive-had used melatonin in the past- states doing better

## 2018-11-17 NOTE — Progress Notes (Signed)
Phone: [phone]  Subjective:  Patient presents today for their annual physical. Chief complaint-noted.   See problem oriented charting- ROS- full  review of systems was completed and negative except for hand pain related t oarthritis.   The following were reviewed and entered/updated in epic: Past Medical History:  Diagnosis Date  . Anginal pain ([place]) 08/29/2012   "have had problems w/this"  . Anxiety 08/29/2012  . Arthritis 08/29/2012   right hand, Seen by Dr. Estanislado Pandy  . CAD (coronary artery disease)    nonobstructive  . Depression   . Exertional dyspnea 08/29/2012  . GERD (gastroesophageal reflux disease)   . Hammer toe    surgery x2, second with Dr. Sharol Given- only other option amputatoin  . HLD (hyperlipidemia)   . HTN (hypertension)   . PNEUMONIA 07/04/2007   Qualifier: Diagnosis of  By: Larose Kells MD, [place] Syncope and collapse 08/29/2012   "first time ever"   Patient Active Problem List   Diagnosis Date Noted  . Advanced directives, counseling/discussion 01/26/2017    Priority: High  . nonobstructive CAD (coronary artery disease) 12/01/2015    Priority: High  . Memory loss 12/01/2015    Priority: High  . History of adenomatous polyp of colon 08/01/2017    Priority: Medium  . Varicose veins of both lower extremities 12/01/2015    Priority: Medium  . Hyperlipidemia 05/19/2007    Priority: Medium  . Moderate major depression ([place]) 05/19/2007    Priority: Medium  . Essential hypertension 05/19/2007    Priority: Medium  . Insomnia 12/01/2015    Priority: Low  . Glaucoma 12/01/2015    Priority: Low  . Hammer toe     Priority: Low  . Arthritis 08/29/2012    Priority: Low  . GERD 09/06/2009    Priority: Low  . Edema 09/06/2009    Priority: Low  . History of syncope 09/06/2009    Priority: Low  . Hyperglycemia 05/19/2007    Priority: Low  . 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 Trigger finger, right middle finger 09/05/2017  . Primary osteoarthritis of both knees 03/10/2017    . Primary osteoarthritis of both hands 03/09/2017  . Primary osteoarthritis of both feet 03/09/2017  . Rheumatoid factor positive 03/09/2017   Past Surgical History:  Procedure Laterality Date  . ABDOMINAL HYSTERECTOMY  1970's  . APPENDECTOMY  ~ 1970  . CATARACT EXTRACTION W/ INTRAOCULAR LENS  IMPLANT, BILATERAL  ~ 2010   bilateral  . TONSILLECTOMY AND ADENOIDECTOMY  1950's  . VEIN LIGATION AND STRIPPING  2012   bilaterally    Family History  Problem Relation Age of Onset  . Heart disease Mother        brother, father  . Diabetes Mother        sister, brother  . Suicidality Father        48  . Coronary artery disease Brother   . Colon cancer Brother   . Stroke Brother   . Alcoholism Brother   . Ovarian cancer Daughter   . Esophageal cancer Neg Hx   . Rectal cancer Neg Hx   . Stomach cancer Neg Hx   . Liver cancer Neg Hx     Medications- reviewed and updated Current Outpatient Medications  Medication Sig Dispense Refill  . aspirin 81 MG tablet Take 81 mg by mouth daily.     Marland Kitchen atorvastatin (LIPITOR) 80 MG tablet Take 1 tablet (80 mg total) by mouth every evening. 90 tablet 3  . diclofenac sodium (  VOLTAREN) 1 % GEL Apply 3 gm to 3 large joints up to 3 times a day.Dispense 3 tubes with 3 refills. 3 Tube 3  . escitalopram (LEXAPRO) 20 MG tablet TAKE 1 TABLET BY MOUTH DAILY 90 tablet 1  . mupirocin ointment (BACTROBAN) 2 % Apply 1 application topically 2 (two) times daily. To spot on buttocks for 7 days 22 g 0  . omeprazole (PRILOSEC) 40 MG capsule Take 1 capsule (40 mg total) by mouth daily. (Patient taking differently: Take 20 mg by mouth 2 (two) times daily. ) 30 capsule 5   No current facility-administered medications for this visit.     Allergies-reviewed and updated Allergies  Allergen Reactions  . Clindamycin/Lincomycin     Severe rash and into throat  . Trazodone And Nefazodone Other (See Comments)    drowsiness    Social History   Social History  Narrative   Widowed November 19, 2013. Daughter died 04-19-2012. 2 living children. 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 Oropharynx normal Neck: no thyromegaly CV: RRR no murmurs rubs or gallops Lungs: CTAB no crackles, wheeze, rhonchi Abdomen: soft/nontender/nondistended/normal bowel sounds. No rebound or guarding.  Ext: no edema Skin: warm, dry Neuro: grossly normal, moves all extremities, PERRLA   Assessment/Plan:  77 y.o. female presenting for annual physical.  Health Maintenance counseling: 1. Anticipatory guidance: Patient counseled regarding regular dental exams -q6 months, eye exams - yearly,  avoiding smoking and second hand smoke , limiting alcohol to 1 beverage per day- social drinking if goes to the beach .   2. Risk factor reduction:  Advised patient of need for regular exercise and diet rich and fruits and vegetables to reduce risk of heart attack and stroke. Exercise- yoga 4x a week at [facility] and stability training. Diet-patient's weight is stable but she had prior done a great job with weight loss.  Wt Readings from Last 3 Encounters:  11/17/18 197 lb (89.4 kg)  10/27/18 197 lb (89.4 kg)  09/15/18 198 lb (89.8 kg)  3. Immunizations/screenings/ancillary studies- her insurance doesn't cover shingrix sadly-  Immunization History  Administered Date(s) Administered  . Influenza, High Dose Seasonal PF 08/24/2016, 09/28/2018  . Influenza-Unspecified 11/12/2015  . Pneumococcal Conjugate-13 08/24/2016  . Pneumococcal  Polysaccharide-23 12/29/1999  . Pneumococcal-Unspecified 08/27/2010  . Td 12/28/2005, 10/26/2016  4. Cervical cancer screening- past age based screening 5. Breast cancer screening-past required age based screening-she would like to continue yearly mammograms-last on 03/13/2018 and doing 3D 6. Colon cancer screening - 11/03/2017 with 3-year follow-up due to adenoma history 7. Skin cancer screening- no dermatologist. advised regular sunscreen use. Denies worrisome, changing, or new skin lesions.  8. Birth control/STD check- not sexually active 9. Osteoporosis screening at 65-completed November 2015-normal examination 10.  Former smoker-quit over 40 years ago though. No screenings planned   Status of chronic or acute concerns   Venous insufficiency-have recommended compression stockings for  varicose veins- legs actually look better with more regular exercise  Memory loss Memory loss-update MMSE today.  Last MMSE in 2016 was 27 out of 30- today improved to 29/30- so will continue to monitor every few years or sooner if she notes worsening  Hyperlipidemia Hyperlipidemia- has been controlled on atorvastatin 80 mg-updated 3 weeks ago -Being aggressive with history of nonobstructive coronary artery disease- max dose statin.  Continue aspirin as well  GERD GERD- on omeprazole 40 mg (actually 20 mg BID- was getting breakthrough on 40 once a day and 20 once a day- doin gmuch better now)-no recent B12 level and with history of PPI use and memory loss will get B12 level with next labs  Essential hypertension Hypertension well controlled on no medication-with weight loss  Moderate major depression (HCC) Depression- remains on Lexapro 20 mg.  PHQ 9 of 3- doing great- full remission!   Still struggles with loss of her husband and daughter She is still using gardening as therapy.  Still doing yoga 4 times a week and loves socializing there.  Have encouraged [facility] visit- hasnt needed  as doing well  Trial of doxepin for sleep last visit but was too expensive-had used melatonin in the past- states doing better  Future Appointments  Date Time Provider [place]  03/16/2019  9:30 AM Bo Merino, MD [facility] None  05/18/2019  9:40 AM Marin Olp, MD [facility]  11/30/2019 10:40 AM Marin Olp, MD [facility]   Return in about 1 year (around 11/18/2019) for physical.  Return precautions advised.  Garret Reddish, MD

## 2018-11-17 NOTE — Assessment & Plan Note (Signed)
Hypertension well controlled on no medication-with weight loss

## 2019-01-01 ENCOUNTER — Other Ambulatory Visit: Payer: Self-pay | Admitting: Family Medicine

## 2019-02-02 ENCOUNTER — Other Ambulatory Visit: Payer: Self-pay | Admitting: Family Medicine

## 2019-02-02 DIAGNOSIS — Z1231 Encounter for screening mammogram for malignant neoplasm of breast: Secondary | ICD-10-CM

## 2019-02-07 ENCOUNTER — Other Ambulatory Visit: Payer: Self-pay | Admitting: Family Medicine

## 2019-03-02 NOTE — Progress Notes (Deleted)
Office Visit Note  Patient: [name]             Date of Birth: 1941-12-09           MRN: [id number]             PCP: Marin Olp, MD Referring: Marin Olp, MD Visit Date: 03/16/2019 Occupation: @GUAROCC @  Subjective:  No chief complaint on file.   History of Present Illness: [name] is a 78 y.o. female ***   Activities of Daily Living:  Patient reports morning stiffness for *** {minute/hour:19697}.   Patient {ACTIONS;DENIES/REPORTS:21021675::"Denies"} nocturnal pain.  Difficulty dressing/grooming: {ACTIONS;DENIES/REPORTS:21021675::"Denies"} Difficulty climbing stairs: {ACTIONS;DENIES/REPORTS:21021675::"Denies"} Difficulty getting out of chair: {ACTIONS;DENIES/REPORTS:21021675::"Denies"} Difficulty using hands for taps, buttons, cutlery, and/or writing: {ACTIONS;DENIES/REPORTS:21021675::"Denies"}  No Rheumatology ROS completed.   PMFS History:  Patient Active Problem List   Diagnosis Date Noted  . 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 Trigger finger, right middle finger 09/05/2017  . History of adenomatous polyp of colon 08/01/2017  . Primary osteoarthritis of both knees 03/10/2017  . Primary osteoarthritis of both hands 03/09/2017  . Primary osteoarthritis of both feet 03/09/2017  . Rheumatoid factor positive 03/09/2017  . Advanced directives, counseling/discussion 01/26/2017  . nonobstructive CAD (coronary artery disease) 12/01/2015  . 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 Insomnia 12/01/2015  . Glaucoma 12/01/2015  . Memory loss 12/01/2015  . Hammer toe   . Arthritis 08/29/2012  . GERD 09/06/2009  . Edema 09/06/2009  . History of syncope 09/06/2009  . Hyperlipidemia 05/19/2007  . 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[place]) 05/19/2007  . Essential hypertension 05/19/2007  . Hyperglycemia 05/19/2007    Past Medical History:  Diagnosis Date  . Anginal pain ([place]) 08/29/2012   "have had problems w/this"  . Anxiety 08/29/2012  .  Arthritis 08/29/2012   right hand, Seen by Dr. Estanislado Pandy  . CAD (coronary artery disease)    nonobstructive  . Depression   . Exertional dyspnea 08/29/2012  . GERD (gastroesophageal reflux disease)   . Hammer toe    surgery x2, second with Dr. Sharol Given- only other option amputatoin  . HLD (hyperlipidemia)   . HTN (hypertension)   . PNEUMONIA 07/04/2007   Qualifier: Diagnosis of  By: Larose Kells MD, [place] Syncope and collapse 08/29/2012   "first time ever"    Family History  Problem Relation Age of Onset  . Heart disease Mother        brother, father  . Diabetes Mother        sister, brother  . Suicidality Father        62  . Coronary artery disease Brother   . Colon cancer Brother   . Stroke Brother   . Alcoholism Brother   . Ovarian cancer Daughter   . Esophageal cancer Neg Hx   . Rectal cancer Neg Hx   . Stomach cancer Neg Hx   . Liver cancer Neg Hx    Past Surgical History:  Procedure Laterality Date  . ABDOMINAL HYSTERECTOMY  1970's  . APPENDECTOMY  ~ 1970  . CATARACT EXTRACTION W/ INTRAOCULAR LENS  IMPLANT, BILATERAL  ~ 04/04/2009   bilateral  . TONSILLECTOMY AND ADENOIDECTOMY  1950's  . VEIN LIGATION AND STRIPPING  04/05/11   bilaterally   Social History   Social History Narrative   Widowed 04-Nov-2013. Daughter died 2012-04-04. 2 living children. 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 Influenza, High Dose Seasonal PF 08/24/2016, 09/28/2018  . Influenza-Unspecified 11/12/2015  . Pneumococcal Conjugate-13 08/24/2016  . Pneumococcal Polysaccharide-23 12/29/1999  . Pneumococcal-Unspecified 08/27/2010  . Td 12/28/2005, 10/26/2016     Objective: Vital Signs: There were no vitals taken for this visit.   Physical Exam    Musculoskeletal Exam: ***  CDAI Exam: CDAI Score: Not documented Patient Global Assessment: Not documented; Provider Global Assessment: Not documented Swollen: Not documented; Tender: Not documented Joint Exam   Not documented   There is currently no information documented on the homunculus. Go to the Rheumatology activity and complete the homunculus joint exam.  Investigation: No additional findings.  Imaging: No results found.  Recent Labs: Lab Results  Component Value Date   WBC 6.7 10/27/2018   HGB 14.3 10/27/2018   PLT 255.0 10/27/2018   NA 139 10/27/2018   K 4.2 10/27/2018   CL 102 10/27/2018   CO2 28 10/27/2018   GLUCOSE 86 10/27/2018   BUN 19 10/27/2018   CREATININE 0.80 10/27/2018   BILITOT 0.8 10/27/2018   ALKPHOS 67 10/27/2018   AST 24 10/27/2018   ALT 21 10/27/2018   PROT 6.9 10/27/2018   ALBUMIN 4.2 10/27/2018   CALCIUM 9.4 10/27/2018   GFRAA 83 (L) 08/30/2012    Speciality Comments: No specialty comments available.  Procedures:  No procedures performed Allergies: Clindamycin/lincomycin and Trazodone and nefazodone   Assessment / Plan:     Visit Diagnoses: No diagnosis found.   Orders: No orders of the defined types were placed in this encounter.  No orders of the defined types were placed in this encounter.   Face-to-face time spent with patient was *** minutes. Greater than 50% of time was spent in counseling and coordination of care.  Follow-Up Instructions: No follow-ups on file.   Earnestine Mealing, CMA  Note - This record has been created using Editor, commissioning.  Chart creation errors have been sought, but may not always  have been located. Such creation errors do not reflect on  the standard of medical care.

## 2019-03-13 DIAGNOSIS — Z961 Presence of intraocular lens: Secondary | ICD-10-CM | POA: Diagnosis not present

## 2019-03-13 DIAGNOSIS — H5203 Hypermetropia, bilateral: Secondary | ICD-10-CM | POA: Diagnosis not present

## 2019-03-13 DIAGNOSIS — H35363 Drusen (degenerative) of macula, bilateral: Secondary | ICD-10-CM | POA: Diagnosis not present

## 2019-03-14 ENCOUNTER — Other Ambulatory Visit: Payer: Self-pay | Admitting: Family Medicine

## 2019-03-14 DIAGNOSIS — R69 Illness, unspecified: Secondary | ICD-10-CM | POA: Diagnosis not present

## 2019-03-14 MED ORDER — OMEPRAZOLE 20 MG PO CPDR: 20.0000 mg | DELAYED_RELEASE_CAPSULE | Freq: Two times a day (BID) | ORAL | 3 refills | Status: DC

## 2019-03-16 ENCOUNTER — Ambulatory Visit: Payer: Medicare HMO | Admitting: Rheumatology

## 2019-03-16 ENCOUNTER — Inpatient Hospital Stay: Admission: RE | Admit: 2019-03-16 | Payer: Medicare HMO | Source: Ambulatory Visit

## 2019-03-22 DIAGNOSIS — R69 Illness, unspecified: Secondary | ICD-10-CM | POA: Diagnosis not present

## 2019-03-26 ENCOUNTER — Other Ambulatory Visit: Payer: Self-pay | Admitting: Family Medicine

## 2019-05-01 DIAGNOSIS — Z20828 Contact with and (suspected) exposure to other viral communicable diseases: Secondary | ICD-10-CM | POA: Diagnosis not present

## 2019-05-18 ENCOUNTER — Ambulatory Visit: Payer: Medicare HMO | Admitting: Family Medicine

## 2019-06-07 ENCOUNTER — Other Ambulatory Visit: Payer: Self-pay | Admitting: Family Medicine

## 2019-06-21 ENCOUNTER — Ambulatory Visit: Payer: Medicare HMO

## 2019-06-21 ENCOUNTER — Ambulatory Visit: Payer: Medicare HMO | Admitting: *Deleted

## 2019-06-22 NOTE — Progress Notes (Signed)
Office Visit Note  Patient: [name]             Date of Birth: 11-08-1941           MRN: [id number]             PCP: Marin Olp, MD Referring: Marin Olp, MD Visit Date: 07/06/2019 Occupation: @GUAROCC @  Subjective:  Pain in both hands   History of Present Illness: [name] is a 78 y.o. female with history of osteoarthritis.  She presents today with pain in bilateral hands.  She is having increased discomfort in bilateral CMC joints.  She wears her braces at night.  She uses Voltaren gel once to twice daily as needed for pain relief.  She denies any joint swelling at this time.  She continues to have a right middle trigger finger.  She has been sewing masks for the past several months.  She has made between 115 200 masks.  She denies any other joint pain or joint swelling at this time.  She denies any other concerns. She would like to schedule a repeat DEXA.    Activities of Daily Living:  Patient reports morning stiffness for 0 minutes.   Patient Reports nocturnal pain.  Difficulty dressing/grooming: Denies Difficulty climbing stairs: Denies Difficulty getting out of chair: Denies Difficulty using hands for taps, buttons, cutlery, and/or writing: Reports  Review of Systems  Constitutional: Negative for fatigue.  HENT: Positive for mouth dryness. Negative for mouth sores and nose dryness.   Eyes: Negative for pain, itching, visual disturbance and dryness.  Respiratory: Negative for cough, hemoptysis, shortness of breath, wheezing and difficulty breathing.   Cardiovascular: Negative for chest pain, palpitations, hypertension and swelling in legs/feet.  Gastrointestinal: Negative for abdominal pain, blood in stool, constipation and diarrhea.  Endocrine: Negative for increased urination.  Genitourinary: Negative for painful urination and pelvic pain.  Musculoskeletal: Positive for arthralgias, joint pain and joint swelling. Negative for myalgias,  muscle weakness, morning stiffness, muscle tenderness and myalgias.  Skin: Negative for color change, pallor, rash, hair loss, nodules/bumps, redness, skin tightness, ulcers and sensitivity to sunlight.  Allergic/Immunologic: Negative for susceptible to infections.  Neurological: Negative for dizziness, light-headedness, numbness, headaches, memory loss and weakness.  Hematological: Negative for swollen glands.  Psychiatric/Behavioral: Negative for depressed mood, confusion and sleep disturbance. The patient is not nervous/anxious.     PMFS History:  Patient Active Problem List   Diagnosis Date Noted  . 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 Trigger finger, right middle finger 09/05/2017  . History of adenomatous polyp of colon 08/01/2017  . Primary osteoarthritis of both knees 03/10/2017  . Primary osteoarthritis of both hands 03/09/2017  . Primary osteoarthritis of both feet 03/09/2017  . Rheumatoid factor positive 03/09/2017  . Advanced directives, counseling/discussion 01/26/2017  . nonobstructive CAD (coronary artery disease) 12/01/2015  . 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 Insomnia 12/01/2015  . Glaucoma 12/01/2015  . Memory loss 12/01/2015  . Hammer toe   . Arthritis 08/29/2012  . GERD 09/06/2009  . Edema 09/06/2009  . History of syncope 09/06/2009  . Hyperlipidemia 05/19/2007  . 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 Essential hypertension 05/19/2007  . Hyperglycemia 05/19/2007    Past Medical History:  Diagnosis Date  . Anginal pain ([place]) 08/29/2012   "have had problems w/this"  . Anxiety 08/29/2012  . Arthritis 08/29/2012   right hand, Seen by Dr. Estanislado Pandy  . CAD (coronary artery disease)  nonobstructive  . Depression   . Exertional dyspnea 08/29/2012  . GERD (gastroesophageal reflux disease)   . Hammer toe    surgery x2, second with Dr. Sharol Given- only other option amputatoin  . HLD (hyperlipidemia)   . HTN (hypertension)   . PNEUMONIA  07/04/2007   Qualifier: Diagnosis of  By: Larose Kells MD, [place] Syncope and collapse 08/29/2012   "first time ever"    Family History  Problem Relation Age of Onset  . Heart disease Mother        brother, father  . Diabetes Mother        sister, brother  . Suicidality Father        71  . Coronary artery disease Brother   . Colon cancer Brother   . Stroke Brother   . Alcoholism Brother   . Ovarian cancer Daughter   . Esophageal cancer Neg Hx   . Rectal cancer Neg Hx   . Stomach cancer Neg Hx   . Liver cancer Neg Hx    Past Surgical History:  Procedure Laterality Date  . ABDOMINAL HYSTERECTOMY  1970's  . APPENDECTOMY  ~ 1970  . CATARACT EXTRACTION W/ INTRAOCULAR LENS  IMPLANT, BILATERAL  ~ 04/06/09   bilateral  . TONSILLECTOMY AND ADENOIDECTOMY  1950's  . VEIN LIGATION AND STRIPPING  April 07, 2011   bilaterally   Social History   Social History Narrative   Widowed 2013/11/06. Daughter died 04-06-2012. 2 living children. 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 Influenza, High Dose Seasonal PF 08/24/2016, 09/28/2018  . Influenza-Unspecified 11/12/2015  . Pneumococcal Conjugate-13 08/24/2016  . Pneumococcal Polysaccharide-23 12/29/1999  . Pneumococcal-Unspecified 08/27/2010  . Td 12/28/2005, 10/26/2016     Objective: Vital Signs: BP (!) 152/77 (BP Location: Left Arm, Patient Position: Sitting, Cuff Size: Normal)   Pulse 62   Resp 14   Ht 5\' 2"  (1.575 m)   Wt 198 lb 3.2 oz (89.9 kg)   BMI 36.25 kg/m    Physical Exam Vitals signs and nursing note reviewed.  Constitutional:      Appearance: She is well-developed.  HENT:     Head: Normocephalic and atraumatic.  Eyes:     Conjunctiva/sclera: Conjunctivae normal.  Neck:      Musculoskeletal: Normal range of motion.  Cardiovascular:     Rate and Rhythm: Normal rate and regular rhythm.     Heart sounds: Normal heart sounds.  Pulmonary:     Effort: Pulmonary effort is normal.     Breath sounds: Normal breath sounds.  Abdominal:     General: Bowel sounds are normal.     Palpations: Abdomen is soft.  Lymphadenopathy:     Cervical: No cervical adenopathy.  Skin:    General: Skin is warm and dry.     Capillary Refill: Capillary refill takes less than 2 seconds.  Neurological:     Mental Status: She is alert and oriented to person, place, and time.  Psychiatric:        Behavior: Behavior normal.      Musculoskeletal Exam: C-spine, thoracic spine, and lumbar spine good ROM.  No midline spinal tenderness.  No SI joint tenderness.  Shoulder  joints, elbow joints, wrist joints, MCPs, PIPs, and DIPs good ROM with no synovitis. PIP and DIP synovial thickening.  Subluxation of several DIP joints.  Tenderness and synovial thickening of both CMC joints.  Left hip has limited ROM.  Right hip has full ROM with no discomfort.  Knee joints good ROM with no warmth or effusion.  Ankle joints good ROM with no tenderness or inflammation.  Overcrowding of toes noted.  PIP and DIP synovial thickening of both feet.   CDAI Exam: CDAI Score: - Patient Global: -; Provider Global: - Swollen: -; Tender: - Joint Exam   No joint exam has been documented for this visit   There is currently no information documented on the homunculus. Go to the Rheumatology activity and complete the homunculus joint exam.  Investigation: No additional findings.  Imaging: No results found.  Recent Labs: Lab Results  Component Value Date   WBC 6.7 10/27/2018   HGB 14.3 10/27/2018   PLT 255.0 10/27/2018   NA 139 10/27/2018   K 4.2 10/27/2018   CL 102 10/27/2018   CO2 28 10/27/2018   GLUCOSE 86 10/27/2018   BUN 19 10/27/2018   CREATININE 0.80 10/27/2018   BILITOT 0.8 10/27/2018   ALKPHOS 67  10/27/2018   AST 24 10/27/2018   ALT 21 10/27/2018   PROT 6.9 10/27/2018   ALBUMIN 4.2 10/27/2018   CALCIUM 9.4 10/27/2018   GFRAA 83 (L) 08/30/2012    Speciality Comments: No specialty comments available.  Procedures:  No procedures performed Allergies: Clindamycin/lincomycin and Trazodone and nefazodone   Assessment / Plan:     Visit Diagnoses: Primary osteoarthritis of both hands -She has PIP and DIP synovial thickening.  She has subluxation of several DIP joints.  Tenderness and synovial thickening of bilateral CMC joints noted. She has chronic pain in both CMC joints.  She has been sewing masks recently and has made 150-200 masks.  She has noticed an exacerbation of her hand pain since then.  She has been using voltaren gel topically 1-2 times daily PRN for pain relief.  She also has been wearing bilateral CMC joint braces at night.  We discussed the importance of joint protection and muscle strengthening.  She has given a handout of hand exercises to perform.  She was advised to notify us if she develops increased joint pain or joint swelling.  She will call our office to schedule an ultrasound guided [facility] joint injection in the future if the pain persists or worsens.  She will follow up in 6 months.   Primary osteoarthritis of both knees-She has good ROM with no discomfort.  She has no warmth or effusion.    Primary osteoarthritis of both feet -She has PIP and DIP synovial thickening consistent with osteoarthritis of both feet.  Overcrowding of toes.   Trigger finger, right middle finger - She experiences intermittent locking and tenderness.   Rheumatoid factor positive - RF 19, anti-CCP negative: She has no synovitis.   Primary insomnia - She has been sleeping ok at night.   Screening for osteoporosis - DEXA on 11/19/14: Right femur neck BMD 1.086 with T-score 0.3.  Future order for DEXA ordered today. Plan: DG BONE DENSITY (DXA)  Other medical conditions are listed as follows:    History of hyperlipidemia   History of depression   History of gastroesophageal reflux (GERD)   History of hypertension   History of coronary artery disease   History of adenomatous polyp of colon  Orders: Orders Placed This Encounter  Procedures  . DG BONE DENSITY (DXA)   No orders of the defined types were placed in this encounter.   Face-to-face time spent with patient was 30 minutes. Greater than 50% of time was spent in counseling and coordination of care.  Follow-Up Instructions: Return in about 6 months (around 01/06/2020) for Osteoarthritis.   Ofilia Neas, PA-C  I examined and evaluated the patient with Hazel Sams PA.  Clinical findings are consistent with osteoarthritis.  She has DIP PIP and CMC thickening on examination.  The plan of care was discussed as noted above.  Bo Merino, MD Note - This record has been created using Editor, commissioning.  Chart creation errors have been sought, but may not always  have been located. Such creation errors do not reflect on  the standard of medical care.

## 2019-07-06 ENCOUNTER — Encounter: Payer: Self-pay | Admitting: Rheumatology

## 2019-07-06 ENCOUNTER — Encounter: Payer: Self-pay | Admitting: Family Medicine

## 2019-07-06 ENCOUNTER — Ambulatory Visit (INDEPENDENT_AMBULATORY_CARE_PROVIDER_SITE_OTHER): Payer: Medicare HMO | Admitting: Family Medicine

## 2019-07-06 ENCOUNTER — Other Ambulatory Visit: Payer: Self-pay

## 2019-07-06 ENCOUNTER — Ambulatory Visit: Payer: Medicare HMO | Admitting: Rheumatology

## 2019-07-06 VITALS — BP 152/77 | HR 62 | Resp 14 | Ht 62.0 in | Wt 198.2 lb

## 2019-07-06 VITALS — Ht 65.0 in | Wt 197.0 lb

## 2019-07-06 DIAGNOSIS — Z860101 Personal history of adenomatous and serrated colon polyps: Secondary | ICD-10-CM

## 2019-07-06 DIAGNOSIS — E669 Obesity, unspecified: Secondary | ICD-10-CM | POA: Diagnosis not present

## 2019-07-06 DIAGNOSIS — M19042 Primary osteoarthritis, left hand: Secondary | ICD-10-CM

## 2019-07-06 DIAGNOSIS — I2510 Atherosclerotic heart disease of native coronary artery without angina pectoris: Secondary | ICD-10-CM | POA: Diagnosis not present

## 2019-07-06 DIAGNOSIS — Z8719 Personal history of other diseases of the digestive system: Secondary | ICD-10-CM | POA: Diagnosis not present

## 2019-07-06 DIAGNOSIS — Z8639 Personal history of other endocrine, nutritional and metabolic disease: Secondary | ICD-10-CM

## 2019-07-06 DIAGNOSIS — R079 Chest pain, unspecified: Secondary | ICD-10-CM

## 2019-07-06 DIAGNOSIS — F325 Major depressive disorder, single episode, in full remission: Secondary | ICD-10-CM | POA: Diagnosis not present

## 2019-07-06 DIAGNOSIS — M170 Bilateral primary osteoarthritis of knee: Secondary | ICD-10-CM

## 2019-07-06 DIAGNOSIS — Z13820 Encounter for screening for osteoporosis: Secondary | ICD-10-CM

## 2019-07-06 DIAGNOSIS — Z8659 Personal history of other mental and behavioral disorders: Secondary | ICD-10-CM

## 2019-07-06 DIAGNOSIS — R768 Other specified abnormal immunological findings in serum: Secondary | ICD-10-CM | POA: Diagnosis not present

## 2019-07-06 DIAGNOSIS — M19071 Primary osteoarthritis, right ankle and foot: Secondary | ICD-10-CM | POA: Diagnosis not present

## 2019-07-06 DIAGNOSIS — Z86010 Personal history of colonic polyps: Secondary | ICD-10-CM

## 2019-07-06 DIAGNOSIS — M65331 Trigger finger, right middle finger: Secondary | ICD-10-CM | POA: Diagnosis not present

## 2019-07-06 DIAGNOSIS — M19072 Primary osteoarthritis, left ankle and foot: Secondary | ICD-10-CM

## 2019-07-06 DIAGNOSIS — R7689 Other specified abnormal immunological findings in serum: Secondary | ICD-10-CM

## 2019-07-06 DIAGNOSIS — F5101 Primary insomnia: Secondary | ICD-10-CM

## 2019-07-06 DIAGNOSIS — R69 Illness, unspecified: Secondary | ICD-10-CM | POA: Diagnosis not present

## 2019-07-06 DIAGNOSIS — Z8679 Personal history of other diseases of the circulatory system: Secondary | ICD-10-CM | POA: Diagnosis not present

## 2019-07-06 DIAGNOSIS — M19041 Primary osteoarthritis, right hand: Secondary | ICD-10-CM | POA: Diagnosis not present

## 2019-07-06 DIAGNOSIS — E785 Hyperlipidemia, unspecified: Secondary | ICD-10-CM

## 2019-07-06 DIAGNOSIS — I10 Essential (primary) hypertension: Secondary | ICD-10-CM | POA: Diagnosis not present

## 2019-07-06 DIAGNOSIS — K219 Gastro-esophageal reflux disease without esophagitis: Secondary | ICD-10-CM

## 2019-07-06 MED ORDER — ESCITALOPRAM OXALATE 20 MG PO TABS: 20.0000 mg | ORAL_TABLET | Freq: Every day | ORAL | 3 refills | Status: DC

## 2019-07-06 MED ORDER — OMEPRAZOLE 20 MG PO CPDR: 20.0000 mg | DELAYED_RELEASE_CAPSULE | Freq: Two times a day (BID) | ORAL | 3 refills | Status: DC

## 2019-07-06 MED ORDER — ATORVASTATIN CALCIUM 80 MG PO TABS: 80.0000 mg | ORAL_TABLET | Freq: Every evening | ORAL | 3 refills | Status: DC

## 2019-07-06 NOTE — Progress Notes (Addendum)
Phone [phone]   Subjective:  Virtual visit via Video note. Chief complaint: Chief Complaint  Patient presents with   Follow-up   Depression   Hypertension   Hyperlipidemia   Gastroesophageal Reflux   This visit type was conducted due to national recommendations for restrictions regarding the COVID-19 Pandemic (e.g. social distancing).  This format is felt to be most appropriate for this patient at this time balancing risks to patient and risks to population by having him in for in person visit.  No physical exam was performed (except for noted visual exam or audio findings with Telehealth visits).    Our team/I connected with [name] at  8:40 AM EDT by a video enabled telemedicine application ([email or web address] or caregility through epic) and verified that I am speaking with the correct person using two identifiers.  Location patient: [facility] Location provider: [facility], office Persons participating in the virtual visit:  patient  Our team/I discussed the limitations of evaluation and management by telemedicine and the availability of in person appointments. In light of current covid-19 pandemic, patient also understands that we are trying to protect them by minimizing in office contact if at all possible.  The patient expressed consent for telemedicine visit and agreed to proceed. Patient understands insurance will be billed.   ROS-  Some exertional chest pain and shortness of breath. No fever/chills/cough.    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[place]) 05/19/2007    Priority: Medium   Essential hypertension 05/19/2007    Priority: Medium   Insomnia 12/01/2015    Priority: Low   Glaucoma 12/01/2015    Priority: Low   Hammer toe     Priority: Low   Arthritis 08/29/2012    Priority: Low   GERD 09/06/2009    Priority: Low   Edema 09/06/2009    Priority: Low   History of syncope 09/06/2009    Priority: Low   Hyperglycemia 05/19/2007    Priority: Low   Diaphoresis 05/12/2018   Trigger finger, right middle finger 09/05/2017   Primary osteoarthritis of both knees 03/10/2017   Primary osteoarthritis of both hands 03/09/2017   Primary osteoarthritis of both feet 03/09/2017   Rheumatoid factor positive 03/09/2017    Medications- reviewed and updated Current Outpatient Medications  Medication Sig Dispense Refill   aspirin 81 MG tablet Take 81 mg by mouth daily.      atorvastatin (LIPITOR) 80 MG tablet TAKE ONE TABLET BY MOUTH DAILY IN THE EVENING  90 tablet 0   diclofenac sodium (VOLTAREN) 1 % GEL Apply 3 gm to 3 large joints up to 3 times a day.Dispense 3 tubes with 3 refills. 3 Tube 3   escitalopram (LEXAPRO) 20 MG tablet TAKE ONE TABLET BY MOUTH ONE TIME DAILY  90 tablet 0   omeprazole (PRILOSEC) 20 MG capsule Take 1 capsule (20 mg total) by mouth 2 (two) times daily. 90 capsule 3   mupirocin ointment (BACTROBAN) 2 % Apply 1 application topically 2 (two) times daily. To  spot on buttocks for 7 days 22 g 0   omeprazole (PRILOSEC) 40 MG capsule Take 1 capsule (40 mg total) by mouth daily. (Patient taking differently: Take 20 mg by mouth 2 (two) times daily. ) 30 capsule 5   No current facility-administered medications for this visit.      Objective:  Ht 5\' 5"  (1.651 m)    Wt 197 lb (89.4 kg)    BMI 32.78 kg/m  self reported vitals Gen: NAD, resting comfortably Lungs: nonlabored, normal respiratory rate  Skin: appears dry, no obvious rash    Assessment  and Plan   # social update- did her first lap at the [facility] yesterday in the pool (had been going 4-5 days for at least an hour). Tried to exercise using the tv but feels like she can "cheat".  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 Goes out and walks the dog but otherwise stays in. Garden is doing well. Is going to work with voting and masks will have something to do with voting.   #hypertension S: controlled on  no rx though diastolic high normal on last check BP Readings from Last 3 Encounters:  11/17/18 118/88  10/27/18 120/82  09/15/18 123/67  A/P: Encouraged patient to buy a home blood pressure cuff and then send [place] the results of her blood pressure when she checks it-our goal is for her to be less than 140/90 even better closer to 120/80. She is going to Dr. Estanislado Pandy today and they will likely check.  Addendum "Blood pressure was very high on no medication at Dr. Kirke Corin office- please advise patient to purchase home cuff- omron series 3 from [email or web address] is reasonable and under $33 - use the upper arm cuff. I want her to do some home checks for a few weeks and then update [place] with #s"  #hyperlipidemia/nonobstructive CAD S:  controlled on Atorvastatin 80 mg and Aspirin 81 mg daily last November  Patient states she started with chest tightness with climbing hills with her dog in last 3-4 weeks. Feels winded.  Lab Results  Component Value Date   CHOL 167 10/27/2018   HDL 72.30 10/27/2018   LDLCALC 78 10/27/2018   LDLDIRECT 79.0 08/01/2017   TRIG 81.0 10/27/2018   CHOLHDL 2 10/27/2018   A/P: Stable lipids. Continue current medications.   With nonobstructive coronary artery disease and new symptoms of chest tightness with exertion-we will refer back to Dr. Stanford Breed of cardiology who she saw last in 2013. Advised her not to push herself before that visit- want her to avoid anything that causes chest pain until  cardiology evaluation.   # Depression S: Taking Escitalopram 20 mg daily. PHQ9 of 0. Is trying to find ways to stay active and that has been helpful A/P: single episode remains in full remission - continue escitalopram- considered reducing to 10 mg but we will hold off for now with COVID-19 pandemic and related stressors  # GERD S:Taking Omeprazole 20 mg 2 times daily.  no recent issues    A/P:Still gets breakthrough at times depending on position so do not feel we can reduce dose  # Obesity  S: Patient has really been missing the [facility] but continues to try to eat a healthy diet and try to remain active at home Wt Readings from Last 3 Encounters:  07/06/19 197 lb (89.4 kg)  11/17/18 197 lb (89.4 kg)  10/27/18 197 lb (89.4 kg)  A/P: Congratulated patient on stability of weight despite COVID-19-hopefully she will be able to continue weight loss journey when things stabilize and she can get back to the [facility]- Encouraged need for healthy eating, regular exercise, weight loss.    Future Appointments  Date Time Provider [place]  07/06/2019 11:15 AM Bo Merino, MD [facility] None  11/30/2019 10:40 AM Yong Channel Brayton Mars, MD [facility]   Lab/Order associations:   ICD-10-CM   1. Essential hypertension  I10   2. Coronary artery disease involving native coronary artery of native heart without angina pectoris  I25.10 Ambulatory referral to Cardiology  3. Hyperlipidemia, unspecified hyperlipidemia type  E78.5   4. Major depressive disorder with single episode, in full remission ([place])  F32.5   5. Gastroesophageal reflux disease without esophagitis  K21.9   6. Exertional chest pain  R07.9 Ambulatory referral to Cardiology   Return precautions advised.  Garret Reddish, MD

## 2019-07-06 NOTE — Patient Instructions (Signed)
There are no preventive care reminders to display for this patient.  Depression screen [facility] 2/9 11/17/2018 06/15/2018 05/12/2018  Decreased Interest 0 1 1  Down, Depressed, Hopeless 1 1 1   PHQ - 2 Score 1 2 2   Altered sleeping 1 - 3  Tired, decreased energy 0 0 1  Change in appetite 0 0 0  Feeling bad or failure about yourself  1 0 0  Trouble concentrating 0 0 1  Moving slowly or fidgety/restless 0 0 1  Suicidal thoughts 0 0 0  PHQ-9 Score 3 - 8  Difficult doing work/chores - Not difficult at all Not difficult at all

## 2019-07-06 NOTE — Patient Instructions (Signed)
Hand Exercises °Hand exercises can be helpful for almost anyone. These exercises can strengthen the hands, improve flexibility and movement, and increase blood flow to the hands. These results can make work and daily tasks easier. Hand exercises can be especially helpful for people who have joint pain from arthritis or have nerve damage from overuse (carpal tunnel syndrome). These exercises can also help people who have injured a hand. °Exercises °Most of these hand exercises are gentle stretching and motion exercises. It is usually safe to do them often throughout the day. Warming up your hands before exercise may help to reduce stiffness. You can do this with gentle massage or by placing your hands in warm water for 10-15 minutes. °It is normal to feel some stretching, pulling, tightness, or mild discomfort as you begin new exercises. This will gradually improve. Stop an exercise right away if you feel sudden, severe pain or your pain gets worse. Ask your health care provider which exercises are best for you. °Knuckle bend or "claw" fist °1. Stand or sit with your arm, hand, and all five fingers pointed straight up. Make sure to keep your wrist straight during the exercise. °2. Gently bend your fingers down toward your palm until the tips of your fingers are touching the top of your palm. Keep your big knuckle straight and just bend the small knuckles in your fingers. °3. Hold this position for __________ seconds. °4. Straighten (extend) your fingers back to the starting position. °Repeat this exercise 5-10 times with each hand. °Full finger fist °1. Stand or sit with your arm, hand, and all five fingers pointed straight up. Make sure to keep your wrist straight during the exercise. °2. Gently bend your fingers into your palm until the tips of your fingers are touching the middle of your palm. °3. Hold this position for __________ seconds. °4. Extend your fingers back to the starting position, stretching every  joint fully. °Repeat this exercise 5-10 times with each hand. °Straight fist °1. Stand or sit with your arm, hand, and all five fingers pointed straight up. Make sure to keep your wrist straight during the exercise. °2. Gently bend your fingers at the big knuckle, where your fingers meet your hand, and the middle knuckle. Keep the knuckle at the tips of your fingers straight and try to touch the bottom of your palm. °3. Hold this position for __________ seconds. °4. Extend your fingers back to the starting position, stretching every joint fully. °Repeat this exercise 5-10 times with each hand. °Tabletop °1. Stand or sit with your arm, hand, and all five fingers pointed straight up. Make sure to keep your wrist straight during the exercise. °2. Gently bend your fingers at the big knuckle, where your fingers meet your hand, as far down as you can while keeping the small knuckles in your fingers straight. Think of forming a tabletop with your fingers. °3. Hold this position for __________ seconds. °4. Extend your fingers back to the starting position, stretching every joint fully. °Repeat this exercise 5-10 times with each hand. °Finger spread °1. Place your hand flat on a table with your palm facing down. Make sure your wrist stays straight as you do this exercise. °2. Spread your fingers and thumb apart from each other as far as you can until you feel a gentle stretch. Hold this position for __________ seconds. °3. Bring your fingers and thumb tight together again. Hold this position for __________ seconds. °Repeat this exercise 5-10 times with each hand. °  Making circles °1. Stand or sit with your arm, hand, and all five fingers pointed straight up. Make sure to keep your wrist straight during the exercise. °2. Make a circle by touching the tip of your thumb to the tip of your index finger. °3. Hold for __________ seconds. Then open your hand wide. °4. Repeat this motion with your thumb and each finger on your  hand. °Repeat this exercise 5-10 times with each hand. °Thumb motion °1. Sit with your forearm resting on a table and your wrist straight. Your thumb should be facing up toward the ceiling. Keep your fingers relaxed as you move your thumb. °2. Lift your thumb up as high as you can toward the ceiling. Hold for __________ seconds. °3. Bend your thumb across your palm as far as you can, reaching the tip of your thumb for the small finger (pinkie) side of your palm. Hold for __________ seconds. °Repeat this exercise 5-10 times with each hand. °Grip strengthening ° °1. Hold a stress ball or other soft ball in the middle of your hand. °2. Slowly increase the pressure, squeezing the ball as much as you can without causing pain. Think of bringing the tips of your fingers into the middle of your palm. All of your finger joints should bend when doing this exercise. °3. Hold your squeeze for __________ seconds, then relax. °Repeat this exercise 5-10 times with each hand. °Contact a health care provider if: °· Your hand pain or discomfort gets much worse when you do an exercise. °· Your hand pain or discomfort does not improve within 2 hours after you exercise. °If you have any of these problems, stop doing these exercises right away. Do not do them again unless your health care provider says that you can. °Get help right away if: °· You develop sudden, severe hand pain or swelling. If this happens, stop doing these exercises right away. Do not do them again unless your health care provider says that you can. °This information is not intended to replace advice given to you by your health care provider. Make sure you discuss any questions you have with your health care provider. °Document Released: 11/24/2015 Document Revised: 04/05/2019 Document Reviewed: 12/14/2018 °Elsevier Patient Education © 2020 [employer] ° °

## 2019-07-12 ENCOUNTER — Telehealth: Payer: Self-pay

## 2019-07-12 DIAGNOSIS — Z780 Asymptomatic menopausal state: Secondary | ICD-10-CM

## 2019-07-12 NOTE — Progress Notes (Signed)
See telephone note.

## 2019-07-12 NOTE — Telephone Encounter (Signed)
May order dexa under postmenopausal. Have her double check with insurance about coverage- appears to me to have had normal DEXA in 2015

## 2019-07-12 NOTE — Telephone Encounter (Signed)
Kelsey Olp, MD  [facility]        Blood pressure was very high on no medication at Dr. Kirke Corin office- please advise patient to purchase home cuff- omron series 3 from [email or web address] is reasonable and under $33 - use the upper arm cuff. I want her to do some home checks for a few weeks and then update [place] with #s

## 2019-07-12 NOTE — Telephone Encounter (Signed)
Called and spoke with patient. She will continue to check BP and report readings in the next 2-3 weeks. Lst 3 BP readings have been 155/89, 146/90, and today 149/85.    Dr. Yong Channel -  She says that Dr. Clois Comber office told her that she is due for DEXA scan, OK to order to [facility]?  [name] -  Pt also checking status of cardiology referral. It was placed 07/06/19 and she has not heart from anyone about scheduling yet. Please contact pt and advise. Thanks!

## 2019-07-13 ENCOUNTER — Encounter: Payer: Self-pay | Admitting: Family Medicine

## 2019-07-13 MED ORDER — AMLODIPINE BESYLATE 2.5 MG PO TABS: 2.5000 mg | ORAL_TABLET | Freq: Every day | ORAL | 3 refills | Status: DC

## 2019-07-13 NOTE — Telephone Encounter (Signed)
Called pt and advised regarding DEXA. She will call back about referral if she doesn't heard from them within the next 1-2 weeks.

## 2019-07-13 NOTE — Addendum Note (Signed)
Addended by: Marin Olp on: 07/13/2019 09:07 PM   Modules accepted: Orders

## 2019-07-13 NOTE — Telephone Encounter (Signed)
Spoke with patient and BP recheck was 154/84. She was feeling light headed earlier when she checked it but that has resolved. She denies HA, dizziness, CP, SOB.

## 2019-07-13 NOTE — Addendum Note (Signed)
Addended by: Jasper Loser on: 07/13/2019 03:35 PM   Modules accepted: Orders

## 2019-08-10 ENCOUNTER — Encounter: Payer: Self-pay | Admitting: Family Medicine

## 2019-08-10 ENCOUNTER — Ambulatory Visit (INDEPENDENT_AMBULATORY_CARE_PROVIDER_SITE_OTHER): Payer: Medicare HMO | Admitting: Family Medicine

## 2019-08-10 ENCOUNTER — Other Ambulatory Visit: Payer: Self-pay

## 2019-08-10 VITALS — BP 132/80 | HR 68 | Temp 97.8°F | Ht 62.0 in | Wt 198.2 lb

## 2019-08-10 DIAGNOSIS — R21 Rash and other nonspecific skin eruption: Secondary | ICD-10-CM

## 2019-08-10 DIAGNOSIS — I10 Essential (primary) hypertension: Secondary | ICD-10-CM | POA: Diagnosis not present

## 2019-08-10 MED ORDER — TRIAMCINOLONE ACETONIDE 0.1 % EX CREA: 1.0000 "application " | TOPICAL_CREAM | Freq: Two times a day (BID) | CUTANEOUS | 1 refills | Status: DC

## 2019-08-10 NOTE — Progress Notes (Signed)
Phone [phone]   Subjective:  [name] is a 78 y.o. year old very pleasant female patient who presents for/with See problem oriented charting Chief Complaint  Patient presents with  . Acute Visit  . Rash   ROS- No fever, chills, cough, congestion, runny nose, shortness of breath, fatigue, body aches, sore throat, headache, nausea, vomiting, diarrhea, or new loss of taste or smell. No known contacts with covid 19 or someone being tested for covid 19.    Past Medical History-  Patient Active Problem List   Diagnosis Date Noted  . Advanced directives, counseling/discussion 01/26/2017    Priority: High  . nonobstructive CAD (coronary artery disease) 12/01/2015    Priority: High  . Memory loss 12/01/2015    Priority: High  . History of adenomatous polyp of colon 08/01/2017    Priority: Medium  . Varicose veins of both lower extremities 12/01/2015    Priority: Medium  . Hyperlipidemia 05/19/2007    Priority: Medium  . 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 Essential hypertension 05/19/2007    Priority: Medium  . Insomnia 12/01/2015    Priority: Low  . Glaucoma 12/01/2015    Priority: Low  . Hammer toe     Priority: Low  . Arthritis 08/29/2012    Priority: Low  . GERD 09/06/2009    Priority: Low  . Edema 09/06/2009    Priority: Low  . History of syncope 09/06/2009    Priority: Low  . Hyperglycemia 05/19/2007    Priority: Low  . 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 Trigger finger, right middle finger 09/05/2017  . Primary osteoarthritis of both knees 03/10/2017  . Primary osteoarthritis of both hands 03/09/2017  . Primary osteoarthritis of both feet 03/09/2017  . Rheumatoid factor positive 03/09/2017    Medications- reviewed and updated Current Outpatient Medications  Medication Sig Dispense Refill  . amLODipine (NORVASC) 2.5 MG tablet Take 1 tablet (2.5 mg total) by mouth daily. 90 tablet 3  . aspirin 81 MG tablet Take 81 mg  by mouth daily.     Marland Kitchen atorvastatin (LIPITOR) 80 MG tablet Take 1 tablet (80 mg total) by mouth every evening. 90 tablet 3  . Biotin w/ Vitamins C & E (HAIR/SKIN/NAILS PO) Take by mouth.    . diclofenac sodium (VOLTAREN) 1 % GEL Apply 3 gm to 3 large joints up to 3 times a day.Dispense 3 tubes with 3 refills. 3 Tube 3  . escitalopram (LEXAPRO) 20 MG tablet Take 1 tablet (20 mg total) by mouth daily. 90 tablet 3  . omeprazole (PRILOSEC) 20 MG capsule Take 1 capsule (20 mg total) by mouth 2 (two) times daily. 180 capsule 3  . triamcinolone cream (KENALOG) 0.1 % Apply 1 application topically 2 (two) times daily. For 7-10 days maximum 160 g 1   No current facility-administered medications for this visit.      Objective:  BP 132/80 (BP Location: Left Arm, Patient Position: Sitting, Cuff Size: Large)   Pulse 68   Temp 97.8 F (36.6 C) (Oral)   Ht 5\' 2"  (1.575 m)   Wt 198 lb 3.2 oz (89.9 kg)   SpO2 97%   BMI 36.25 kg/m  Gen: NAD, resting comfortably No visible lip or tongue swelling CV: RRR no murmurs rubs or gallops Lungs: CTAB no crackles, wheeze, rhonchi Abdomen: soft/nontender/nondistended Skin: warm, dry- multiple small papules on bilateral arms as pictured- not on other side of arms. Several excoriated areas (she says that  occurs when sleeping)        Assessment and Plan   # Rash S:C/o itchy rash on hands and wrists x 1 week. They seems to appear randomly and her hands turns red. She has tried Benadryl tablets and anti-itch benadryl cream and Mupirocin ointment with minimal relief.  Last night she woke up and felt like her throat was tightening- felt like she was gagging- did get up a fair amount of musuc and some green beans- got up 3x total for this- brushed her tongue with electric toothbrush and gagged some more but nothing came up but did have very bad taste in her mouth.  Had mashed potatoes, hamburger, string beans- somewhat heavier meal than usual.  Denies changing lotion,  soap, detergent, perfumes, ect. No new foods. She did start taking Hair/Skin/Nails supplement about 1 month ago with argan oil and biotin. Has 1 indoor dog, has flea treatment. Dog has not been scratching. Has been isolated other than swimming in a lane by herself daily for 45 minutes (uses her own towel) - low risk for getting scabies.  A/P: Patient with rash on both hands and forearms-possible contact dermatitis related to hand sanitizer use or allergic reaction to hair/skin/nail supplement- she is going to discontinue these as much as possible and try to primarily use hands of-can still use sanitizer when out.  We will trial triamcinolone cream to reduce the itch and thus to scratch-itch/scratch cycle likely intensifying issues.  If she fails to improve in 10 days she will let us know  #hypertension S: controlled on amlodipine 2.5 mg  We rechecked blood pressure as she would normally check it at home and BP was 163/88 then I elevated her arm to hear level and was 155/84. I then manually checked P and got 148/82.  BP Readings from Last 3 Encounters:  08/10/19 132/80  07/06/19 (!) 152/77  11/17/18 118/88  A/P: Good control today in office- continue amlodipine 2.5 mg alone for now   Home cuff tends to appear to run 5-10 pts higher than our readings- will have her bring cuff back next time so we can continue to compare- she will continue to monitor at home.   Since initial blood pressure was controlled will continue current medicine. Home #s 130s to 140s mainly with some 150s and sparing 160s- suspect will be improved next time with lifting arm up and accounting for possible 5-10 point higher reading on her home cuff.   Recommended follow up: Already has December visit planned Future Appointments  Date Time Provider [place]  09/24/2019  1:40 PM Lelon Perla, MD [facility]  11/30/2019 10:40 AM Marin Olp, MD [facility]  01/11/2020 10:20 AM Ofilia Neas, PA-C [facility]  None    Lab/Order associations:   ICD-10-CM   1. Rash  R21   2. Essential hypertension  I10     Meds ordered this encounter  Medications  . triamcinolone cream (KENALOG) 0.1 %    Sig: Apply 1 application topically 2 (two) times daily. For 7-10 days maximum    Dispense:  160 g    Refill:  1    Return precautions advised.  Garret Reddish, MD

## 2019-08-10 NOTE — Assessment & Plan Note (Signed)
S: controlled on amlodipine 2.5 mg  We rechecked blood pressure as she would normally check it at home and BP was 163/88 then I elevated her arm to hear level and was 155/84. I then manually checked P and got 148/82.  BP Readings from Last 3 Encounters:  08/10/19 132/80  07/06/19 (!) 152/77  11/17/18 118/88  A/P: Good control today in office- continue amlodipine 2.5 mg alone for now   Home cuff tends to appear to run 5-10 pts higher than our readings- will have her bring cuff back next time so we can continue to compare- she will continue to monitor at home.   Since initial blood pressure was controlled will continue current medicine. Home #s 130s to 140s mainly with some 150s and sparing 160s- suspect will be improved next time with lifting arm up and accounting for possible 5-10 point higher reading on her home cuff.

## 2019-08-10 NOTE — Patient Instructions (Addendum)
Health Maintenance Due  Topic Date Due  . INFLUENZA VACCINE  07/28/2019  We should have flu shots available by September. Please strongly consider getting flu shot this year. If you get your flu shot at a pharmacy- please let us know.   STOP hair/skin/nail supplement as possibly could be causing allergic reaction in skin and its the only thing new in last montth or so  Try to stick to soap and avoid hand sanitizer unless outside the home  Start strong anti itch cream to see if we can cool this off for your. Update [place] if not doing better in 10 days.   Seek care immediately if recurrence of throat swelling that doesn't quickly clear

## 2019-08-31 ENCOUNTER — Telehealth: Payer: Self-pay | Admitting: Family Medicine

## 2019-08-31 NOTE — Telephone Encounter (Signed)
Patient declined to schedule at this time. SF °

## 2019-09-20 NOTE — Progress Notes (Signed)
Referring-Stephen Yong Channel MD Reason for referral-CAD  HPI: 78 year old female for evaluation of coronary artery disease at request of Garret Reddish, MD.  Cardiac cath 2006 showed 30% LAD, 30% circumflex and irregularities in the right coronary artery. Her ejection fraction was 60%. She has also had a previous abdominal ultrasound that showed no aneurysm in October 2007. Carotid Dopplers in October 2007, showed 0-39% bilateral internal carotid artery stenosis. ABIs June 2012 normal. Stress echo June 2012 normal.  Echocardiogram September 2013 showed normal LV function. Carotid Dopplers September 2013 showed no significant stenosis. Monitor August 2018 showed sinus with rare PAC.  Patient seen previously but not since August 2013.  Patient describes chest tightness with more prolonged walks.  Resolves with rest.  She has had this intermittently for years and this was the reason for her catheterization in 2006.  She does not have the symptoms with routine activities nor at rest.  There is some dyspnea but no nausea.  She otherwise has not had orthopnea, PND, pedal edema or syncope.  Cardiology now asked to reassess.    Current Outpatient Medications  Medication Sig Dispense Refill  . amLODipine (NORVASC) 2.5 MG tablet Take 1 tablet (2.5 mg total) by mouth daily. 90 tablet 3  . aspirin 81 MG tablet Take 81 mg by mouth daily.     Marland Kitchen atorvastatin (LIPITOR) 80 MG tablet Take 1 tablet (80 mg total) by mouth every evening. 90 tablet 3  . Biotin w/ Vitamins C & E (HAIR/SKIN/NAILS PO) Take by mouth.    . diclofenac sodium (VOLTAREN) 1 % GEL Apply 3 gm to 3 large joints up to 3 times a day.Dispense 3 tubes with 3 refills. 3 Tube 3  . escitalopram (LEXAPRO) 20 MG tablet Take 1 tablet (20 mg total) by mouth daily. 90 tablet 3  . omeprazole (PRILOSEC) 20 MG capsule Take 1 capsule (20 mg total) by mouth 2 (two) times daily. 180 capsule 3  . triamcinolone cream (KENALOG) 0.1 % Apply 1 application topically 2  (two) times daily. For 7-10 days maximum 160 g 1   No current facility-administered medications for this visit.     Allergies  Allergen Reactions  . Clindamycin/Lincomycin     Severe rash and into throat  . Trazodone And Nefazodone Other (See Comments)    drowsiness     Past Medical History:  Diagnosis Date  . Anginal pain ([place]) 08/29/2012   "have had problems w/this"  . Anxiety 08/29/2012  . Arthritis 08/29/2012   right hand, Seen by Dr. Estanislado Pandy  . CAD (coronary artery disease)    nonobstructive  . Depression   . Exertional dyspnea 08/29/2012  . GERD (gastroesophageal reflux disease)   . Hammer toe    surgery x2, second with Dr. Sharol Given- only other option amputatoin  . HLD (hyperlipidemia)   . HTN (hypertension)   . PNEUMONIA 07/04/2007   Qualifier: Diagnosis of  By: Larose Kells MD, [place] Syncope and collapse 08/29/2012   "first time ever"    Past Surgical History:  Procedure Laterality Date  . ABDOMINAL HYSTERECTOMY  1970's  . APPENDECTOMY  ~ 1970  . CATARACT EXTRACTION W/ INTRAOCULAR LENS  IMPLANT, BILATERAL  ~ 2010   bilateral  . TONSILLECTOMY AND ADENOIDECTOMY  1950's  . VEIN LIGATION AND STRIPPING  2012   bilaterally    Social History   Socioeconomic History  . Marital status: Widowed    Spouse name: Not on file  . Number of children: 3  .  Years of education: Not on file  . Highest education level: Not on file  Occupational History  . Occupation: retired  Scientific laboratory technician  . Financial resource strain: Not on file  . Food insecurity    Worry: Not on file    Inability: Not on file  . Transportation needs    Medical: Not on file    Non-medical: Not on file  Tobacco Use  . Smoking status: Former Smoker    Packs/day: 1.00    Years: 20.00    Pack years: 20.00    Quit date: 12/27/1978    Years since quitting: 40.7  . Smokeless tobacco: Never Used  . Tobacco comment: teenager  Substance and Sexual Activity  . Alcohol use: No  . Drug use: No  . Sexual activity:  Never  Lifestyle  . Physical activity    Days per week: Not on file    Minutes per session: Not on file  . Stress: Not on file  Relationships  . Social Herbalist on phone: Not on file    Gets together: Not on file    Attends religious service: Not on file    Active member of club or organization: Not on file    Attends meetings of clubs or organizations: Not on file    Relationship status: Not on file  . Intimate partner violence    Fear of current or ex partner: Not on file    Emotionally abused: Not on file    Physically abused: Not on file    Forced sexual activity: Not on file  Other Topics Concern  . Not on file  Social History Narrative   Widowed 11-Nov-2013. Daughter died 2012-04-11. 2 living children. 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      Hobbies: gardening, time with dog, time with grandkids    Family History  Problem Relation Age of Onset  . Heart disease Mother        brother, father  . Diabetes Mother        sister, brother  . Suicidality Father        75  . Coronary artery disease Brother   . Colon cancer Brother   . Stroke Brother   . Alcoholism Brother   . Ovarian cancer Daughter   . Esophageal cancer Neg Hx   . Rectal cancer Neg Hx   . Stomach cancer Neg Hx   . Liver cancer Neg Hx     ROS: no fevers or chills, productive cough, hemoptysis, dysphasia, odynophagia, melena, hematochezia, dysuria, hematuria, rash, seizure activity, orthopnea, PND, pedal edema, claudication. Remaining systems are negative.  Physical Exam:   Blood pressure 122/70, pulse 66, temperature 97.7 F (36.5 C), height 5\' 2"  (1.575 m), weight 199 lb (90.3 kg).  General:  Well developed/well nourished in NAD Skin warm/dry Patient not depressed No peripheral clubbing Back-normal HEENT-normal/normal eyelids Neck supple/normal carotid  upstroke bilaterally; no bruits; no JVD; no thyromegaly chest - CTA/ normal expansion CV - RRR/normal S1 and S2; no murmurs, rubs or gallops;  PMI nondisplaced Abdomen -NT/ND, no HSM, no mass, + bowel sounds, no bruit 2+ femoral pulses, no bruits Ext-no edema, chords, 2+ DP Neuro-grossly nonfocal  ECG -sinus rhythm with PACs, no ST changes.  Personally reviewed  A/P  1 coronary  artery disease-plan to continue medical therapy with aspirin and statin.  2 chest pain-patient has symptoms that sound concerning for angina.  However they are longstanding and were present at the time of her previous catheterization in 2006.  I will add Toprol 25 mg daily.  Schedule stress nuclear study for risk stratification.  If significantly abnormal may require repeat catheterization.  3 hypertension-blood pressure is controlled.  Continue present medications and follow.  4 hyperlipidemia-continue statin.  Kirk Ruths, MD

## 2019-09-24 ENCOUNTER — Encounter: Payer: Self-pay | Admitting: Cardiology

## 2019-09-24 ENCOUNTER — Encounter: Payer: Self-pay | Admitting: Cardiovascular Disease

## 2019-09-24 ENCOUNTER — Ambulatory Visit: Payer: Medicare HMO | Admitting: Cardiology

## 2019-09-24 ENCOUNTER — Other Ambulatory Visit: Payer: Self-pay

## 2019-09-24 VITALS — BP 122/70 | HR 66 | Temp 97.7°F | Ht 62.0 in | Wt 199.0 lb

## 2019-09-24 DIAGNOSIS — R079 Chest pain, unspecified: Secondary | ICD-10-CM

## 2019-09-24 DIAGNOSIS — E7800 Pure hypercholesterolemia, unspecified: Secondary | ICD-10-CM

## 2019-09-24 DIAGNOSIS — I2510 Atherosclerotic heart disease of native coronary artery without angina pectoris: Secondary | ICD-10-CM | POA: Diagnosis not present

## 2019-09-24 DIAGNOSIS — I10 Essential (primary) hypertension: Secondary | ICD-10-CM | POA: Diagnosis not present

## 2019-09-24 MED ORDER — METOPROLOL SUCCINATE ER 25 MG PO TB24: 25.0000 mg | ORAL_TABLET | Freq: Every day | ORAL | 3 refills | Status: DC

## 2019-09-24 NOTE — Patient Instructions (Signed)
Medication Instructions:  START METOPROLOL SUC 25 MG DAILY   If you need a refill on your cardiac medications before your next appointment, please call your pharmacy.   Lab work: NONE  Testing/Procedures: Your physician has requested that you have a lexiscan myoview. For further information please visit [email or web address]. Please follow instruction sheet, as given. AT [place]: Your physician recommends that you schedule a follow-up appointment in: [place]  Your physician wants you to follow-up in: [place] will receive a reminder letter in the mail two months in advance. If you don't receive a letter, please call our office to schedule the follow-up appointment.  Any Other Special Instructions Will Be Listed Below (If Applicable).   Cardiac Nuclear Scan A cardiac nuclear scan is a test that is done to check the flow of blood to your heart. It is done when you are resting and when you are exercising. The test looks for problems such as:  Not enough blood reaching a portion of the heart.  The heart muscle not working as it should. You may need this test if:  You have heart disease.  You have had lab results that are not normal.  You have had heart surgery or a balloon procedure to open up blocked arteries (angioplasty).  You have chest pain.  You have shortness of breath. In this test, a special dye (tracer) is put into your bloodstream. The tracer will travel to your heart. A camera will then take pictures of your heart to see how the tracer moves through your heart. This test is usually done at a hospital and takes 2-4 hours. Tell a doctor about:  Any allergies you have.  All medicines you are taking, including vitamins, herbs, eye drops, creams, and over-the-counter medicines.  Any problems you or family members have had with anesthetic medicines.  Any blood disorders you have.  Any surgeries you have had.  Any  medical conditions you have.  Whether you are pregnant or may be pregnant. What are the risks? Generally, this is a safe test. However, problems may occur, such as:  Serious chest pain and heart attack. This is only a risk if the stress portion of the test is done.  Rapid heartbeat.  A feeling of warmth in your chest. This feeling usually does not last long.  Allergic reaction to the tracer. What happens before the test?  Ask your doctor about changing or stopping your normal medicines. This is important.  Follow instructions from your doctor about what you cannot eat or drink.  Remove your jewelry on the day of the test. What happens during the test?  An IV tube will be inserted into one of your veins.  Your doctor will give you a small amount of tracer through the IV tube.  You will wait for 20-40 minutes while the tracer moves through your bloodstream.  Your heart will be monitored with an electrocardiogram (ECG).  You will lie down on an exam table.  Pictures of your heart will be taken for about 15-20 minutes.  You may also have a stress test. For this test, one of these things may be done: ? You will be asked to exercise on a treadmill or a stationary bike. ? You will be given medicines that will make your heart work harder. This is done if you are unable to exercise.  When blood flow to your heart has peaked, a tracer will again  be given through the IV tube.  After 20-40 minutes, you will get back on the exam table. More pictures will be taken of your heart.  Depending on the tracer that is used, more pictures may need to be taken 3-4 hours later.  Your IV tube will be removed when the test is over. The test may vary among doctors and hospitals. What happens after the test?  Ask your doctor: ? Whether you can return to your normal schedule, including diet, activities, and medicines. ? Whether you should drink more fluids. This will help to remove the tracer  from your body. Drink enough fluid to keep your pee (urine) pale yellow.  Ask your doctor, or the department that is doing the test: ? When will my results be ready? ? How will I get my results? Summary  A cardiac nuclear scan is a test that is done to check the flow of blood to your heart.  Tell your doctor whether you are pregnant or may be pregnant.  Before the test, ask your doctor about changing or stopping your normal medicines. This is important.  Ask your doctor whether you can return to your normal activities. You may be asked to drink more fluids. This information is not intended to replace advice given to you by your health care provider. Make sure you discuss any questions you have with your health care provider. Document Released: 05/29/2018 Document Revised: 04/04/2019 Document Reviewed: 05/29/2018 Elsevier Patient Education  2020 [employer].

## 2019-09-25 DIAGNOSIS — R69 Illness, unspecified: Secondary | ICD-10-CM | POA: Diagnosis not present

## 2019-09-26 ENCOUNTER — Ambulatory Visit: Payer: Medicare HMO | Admitting: Rheumatology

## 2019-09-26 ENCOUNTER — Other Ambulatory Visit: Payer: Self-pay

## 2019-09-26 DIAGNOSIS — M180 Bilateral primary osteoarthritis of first carpometacarpal joints: Secondary | ICD-10-CM

## 2019-09-26 MED ORDER — TRIAMCINOLONE ACETONIDE 40 MG/ML IJ SUSP
10.0000 mg | INTRAMUSCULAR | Status: AC | PRN
Start: 2019-09-26 — End: 2019-09-26
  Administered 2019-09-26: 10 mg via INTRA_ARTICULAR

## 2019-09-26 MED ORDER — TRIAMCINOLONE ACETONIDE 40 MG/ML IJ SUSP
10.0000 mg | INTRAMUSCULAR | Status: AC | PRN
  Administered 2019-09-26: 10 mg via INTRA_ARTICULAR

## 2019-09-26 MED ORDER — LIDOCAINE HCL 1 % IJ SOLN
0.5000 mL | INTRAMUSCULAR | Status: AC | PRN
Start: 2019-09-26 — End: 2019-09-26
  Administered 2019-09-26: .5 mL

## 2019-09-26 MED ORDER — LIDOCAINE HCL 1 % IJ SOLN
0.5000 mL | INTRAMUSCULAR | Status: AC | PRN
  Administered 2019-09-26: .5 mL

## 2019-09-26 NOTE — Progress Notes (Signed)
   Procedure Note  Patient: [name]             Date of Birth: Oct 04, 1941           MRN: [id number]             Visit Date: 09/26/2019  Procedures: Visit Diagnoses:  1. 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 Consent was given by the patient. Immediately prior to procedure a time out was called to verify the correct patient, procedure, equipment, support staff and site/side marked as required. Patient was prepped and draped in the usual sterile fashion.     Bo Merino, MD

## 2019-10-01 ENCOUNTER — Telehealth (HOSPITAL_COMMUNITY): Payer: Self-pay

## 2019-10-01 NOTE — Telephone Encounter (Signed)
Attempted to call the patient, her mailbox is full and won't accept any calls. Will try again later. S.Josselin Gaulin EMTP

## 2019-10-02 ENCOUNTER — Ambulatory Visit (HOSPITAL_COMMUNITY): Payer: Medicare HMO | Attending: Cardiovascular Disease

## 2019-10-02 ENCOUNTER — Other Ambulatory Visit: Payer: Self-pay

## 2019-10-02 DIAGNOSIS — R079 Chest pain, unspecified: Secondary | ICD-10-CM | POA: Diagnosis not present

## 2019-10-02 LAB — MYOCARDIAL PERFUSION IMAGING
LV dias vol: 74 mL (ref 46–106)
LV sys vol: 21 mL
Peak HR: 70 {beats}/min
Rest HR: 49 {beats}/min
SDS: 0
SRS: 0
SSS: 0
TID: 0.93

## 2019-10-02 MED ORDER — REGADENOSON 0.4 MG/5ML IV SOLN
0.4000 mg | Freq: Once | INTRAVENOUS | Status: AC
  Administered 2019-10-02: 0.4 mg via INTRAVENOUS

## 2019-10-02 MED ORDER — TECHNETIUM TC 99M TETROFOSMIN IV KIT
10.4000 | PACK | Freq: Once | INTRAVENOUS | Status: AC | PRN
  Administered 2019-10-02: 10.4 via INTRAVENOUS
  Filled 2019-10-02: qty 11

## 2019-10-02 MED ORDER — TECHNETIUM TC 99M TETROFOSMIN IV KIT
32.1000 | PACK | Freq: Once | INTRAVENOUS | Status: AC | PRN
  Administered 2019-10-02: 32.1 via INTRAVENOUS
  Filled 2019-10-02: qty 33

## 2019-11-15 DIAGNOSIS — Z20828 Contact with and (suspected) exposure to other viral communicable diseases: Secondary | ICD-10-CM | POA: Diagnosis not present

## 2019-11-24 ENCOUNTER — Encounter: Payer: Self-pay | Admitting: Family Medicine

## 2019-11-30 ENCOUNTER — Telehealth: Payer: Self-pay | Admitting: Family Medicine

## 2019-11-30 ENCOUNTER — Ambulatory Visit (INDEPENDENT_AMBULATORY_CARE_PROVIDER_SITE_OTHER): Payer: Medicare HMO | Admitting: Family Medicine

## 2019-11-30 ENCOUNTER — Encounter: Payer: Self-pay | Admitting: Family Medicine

## 2019-11-30 VITALS — BP 130/64 | HR 57 | Temp 97.6°F | Ht 62.0 in | Wt 198.4 lb

## 2019-11-30 DIAGNOSIS — I2510 Atherosclerotic heart disease of native coronary artery without angina pectoris: Secondary | ICD-10-CM

## 2019-11-30 DIAGNOSIS — M1990 Unspecified osteoarthritis, unspecified site: Secondary | ICD-10-CM

## 2019-11-30 DIAGNOSIS — E7800 Pure hypercholesterolemia, unspecified: Secondary | ICD-10-CM

## 2019-11-30 DIAGNOSIS — R413 Other amnesia: Secondary | ICD-10-CM | POA: Diagnosis not present

## 2019-11-30 DIAGNOSIS — Z79899 Other long term (current) drug therapy: Secondary | ICD-10-CM | POA: Diagnosis not present

## 2019-11-30 DIAGNOSIS — I10 Essential (primary) hypertension: Secondary | ICD-10-CM | POA: Diagnosis not present

## 2019-11-30 DIAGNOSIS — Z86010 Personal history of colonic polyps: Secondary | ICD-10-CM

## 2019-11-30 DIAGNOSIS — H409 Unspecified glaucoma: Secondary | ICD-10-CM

## 2019-11-30 DIAGNOSIS — Z0000 Encounter for general adult medical examination without abnormal findings: Secondary | ICD-10-CM

## 2019-11-30 DIAGNOSIS — F5101 Primary insomnia: Secondary | ICD-10-CM

## 2019-11-30 DIAGNOSIS — F325 Major depressive disorder, single episode, in full remission: Secondary | ICD-10-CM

## 2019-11-30 DIAGNOSIS — R69 Illness, unspecified: Secondary | ICD-10-CM | POA: Diagnosis not present

## 2019-11-30 LAB — COMPREHENSIVE METABOLIC PANEL
ALT: 21 U/L (ref 0–35)
AST: 25 U/L (ref 0–37)
Albumin: 4.1 g/dL (ref 3.5–5.2)
Alkaline Phosphatase: 66 U/L (ref 39–117)
BUN: 19 mg/dL (ref 6–23)
CO2: 28 mEq/L (ref 19–32)
Calcium: 9.1 mg/dL (ref 8.4–10.5)
Chloride: 103 mEq/L (ref 96–112)
Creatinine, Ser: 0.74 mg/dL (ref 0.40–1.20)
GFR: 75.93 mL/min (ref >60.00)
Glucose, Bld: 88 mg/dL (ref 70–99)
Potassium: 4.2 mEq/L (ref 3.5–5.1)
Sodium: 139 mEq/L (ref 135–145)
Total Bilirubin: 1.1 mg/dL (ref 0.2–1.2)
Total Protein: 6.7 g/dL (ref 6.0–8.3)

## 2019-11-30 LAB — CBC WITH DIFFERENTIAL/PLATELET
Basophils Absolute: 0 10*3/uL (ref 0.0–0.1)
Basophils Relative: 0.5 % (ref 0.0–3.0)
Eosinophils Absolute: 0.1 10*3/uL (ref 0.0–0.7)
Eosinophils Relative: 1.8 % (ref 0.0–5.0)
HCT: 39.5 % (ref 36.0–46.0)
Hemoglobin: 13.5 g/dL (ref 12.0–15.0)
Lymphocytes Relative: 30.9 % (ref 12.0–46.0)
Lymphs Abs: 1.9 10*3/uL (ref 0.7–4.0)
MCHC: 34.1 g/dL (ref 30.0–36.0)
MCV: 89.9 fl (ref 78.0–100.0)
Monocytes Absolute: 0.3 10*3/uL (ref 0.1–1.0)
Monocytes Relative: 4.9 % (ref 3.0–12.0)
Neutro Abs: 3.8 10*3/uL (ref 1.4–7.7)
Neutrophils Relative %: 61.9 % (ref 43.0–77.0)
Platelets: 250 10*3/uL (ref 150.0–400.0)
RBC: 4.39 Mil/uL (ref 3.87–5.11)
RDW: 14 % (ref 11.5–15.5)
WBC: 6.2 10*3/uL (ref 4.0–10.5)

## 2019-11-30 LAB — POC URINALSYSI DIPSTICK (AUTOMATED)
Bilirubin, UA: NEGATIVE
Blood, UA: NEGATIVE
Glucose, UA: NEGATIVE
Ketones, UA: NEGATIVE
Leukocytes, UA: NEGATIVE
Nitrite, UA: NEGATIVE
Protein, UA: NEGATIVE
Spec Grav, UA: 1.015 (ref 1.010–1.025)
Urobilinogen, UA: 0.2 E.U./dL
pH, UA: 8 (ref 5.0–8.0)

## 2019-11-30 LAB — LIPID PANEL
Cholesterol: 161 mg/dL (ref 0–200)
HDL: 66.4 mg/dL (ref >39.00)
LDL Cholesterol: 79 mg/dL (ref 0–99)
NonHDL: 94.97
Total CHOL/HDL Ratio: 2
Triglycerides: 79 mg/dL (ref 0.0–149.0)
VLDL: 15.8 mg/dL (ref 0.0–40.0)

## 2019-11-30 LAB — VITAMIN B12: Vitamin B-12: 261 pg/mL (ref 211–911)

## 2019-11-30 NOTE — Progress Notes (Signed)
Phone: [phone]    Subjective:   Patient presents today for their annual wellness visit.    Preventive Screening-Counseling & Management  Smoking Status: Never Smoker Second Hand Smoking status: No smokers in home Alcohol intake: 0-1 per week  Risk Factors Regular exercise: 4x a week at [employer] Diet: down several lbs from last year! Trying to eat healthy  Wt Readings from Last 3 Encounters:  11/30/19 198 lb 6.4 oz (90 kg)  10/02/19 199 lb (90.3 kg)  09/24/19 199 lb (90.3 kg)    Mobility assessment: moves to table without difficulty, excellent exercise regimen Fall Risk: None  Fall Risk  11/30/2019 06/15/2018 08/01/2017 01/26/2017 12/01/2015  Falls in the past year? 0 No No Yes No  Number falls in past yr: 0 - - 2 or more -  Injury with Fall? 0 - - Yes -  Follow up - - - Education provided -  Comment - - - fell trying to miss some of her new grass- instructed to be careful with her footing even if places her grass at risk -  Opioid use history:  no long term opioids use  Cardiac risk factors:  Nonobstructive CAD known advanced age (older than 12 for men, 87 for women)  treated Hyperlipidemia  treated Hypertension  No diabetes.  Lab Results  Component Value Date   HGBA1C 5.6 08/01/2017   Family History: yes in brother and father    Depression Screen None. PHQ2 0/phq9 of 3 - depression in remission  Depression screen [facility] 2/9 11/30/2019 07/06/2019 11/17/2018 06/15/2018 05/12/2018  Decreased Interest 0 0 0 1 1  Down, Depressed, Hopeless 0 0 1 1 1   PHQ - 2 Score 0 0 1 2 2   Altered sleeping 3 0 1 - 3  Tired, decreased energy 0 0 0 0 1  Change in appetite 0 0 0 0 0  Feeling bad or failure about yourself  0 0 1 0 0  Trouble concentrating 0 0 0 0 1  Moving slowly or fidgety/restless - 0 0 0 1  Suicidal thoughts 0 0 0 0 0  PHQ-9 Score 3 0 3 - 8  Difficult doing work/chores Not difficult at all - - Not difficult at all Not difficult at all   Activities of Daily  Living Independent ADLs (toileting, bathing, dressing, transferring, eating) and IADLs (shopping, housekeeping, managing own medications, and handling finances)  Hearing Difficulties: -patient declines  Cognitive Testing             No reported trouble recently- history of issues in past  Mini cog: normal clock draw. 3/3 delayed recall. Normal test result   vill[facility] baby  List the Names of Other Physician/Practitioners you currently use: Patient Care Team: Marin Olp, MD as PCP - General (Family Medicine) Stanford Breed, Denice Bors, MD as PCP - Cardiology (Cardiology) Stanford Breed Denice Bors, MD as Consulting Physician (Cardiology) Bo Merino, MD as Consulting Physician (Rheumatology) Luberta Mutter, MD as Consulting Physician (Ophthalmology) -  Immunization History  Administered Date(s) Administered   Influenza, High Dose Seasonal PF 08/24/2016, 09/28/2018   Influenza-Unspecified 11/12/2015, 09/25/2019   Pneumococcal Conjugate-13 08/24/2016   Pneumococcal Polysaccharide-23 12/29/1999   Pneumococcal-Unspecified 08/27/2010   Td 12/28/2005, 10/26/2016   Required Immunizations needed today - shingrix but declines for now Health Maintenance  Topic Date Due   Colon Cancer Screening  11/03/2020   Tetanus Vaccine  10/26/2026   Flu Shot  Completed   DEXA scan (bone density measurement)  Completed   Pneumonia vaccines  Completed    Screening tests-  1. Colon cancer screening- due in 2021 due to polyp history 2. Lung Cancer screening- not a candidate 3. Skin cancer screening- no dermatologist. Denies concerns 4. Prostate cancer screening 4. Cervical cancer screening- past age based screening recs 5. Breast cancer screening- past age based screening recs  ROS- No pertinent positives discovered in course of AWV  The following were reviewed and entered/updated in epic: Past Medical History:  Diagnosis Date   Anginal pain ([place]) 08/29/2012   "have had problems  w/this"   Anxiety 08/29/2012   Arthritis 08/29/2012   right hand, Seen by Dr. Estanislado Pandy   CAD (coronary artery disease)    nonobstructive   Depression    Exertional dyspnea 08/29/2012   GERD (gastroesophageal reflux disease)    Hammer toe    surgery x2, second with Dr. Sharol Given- only other option amputatoin   HLD (hyperlipidemia)    HTN (hypertension)    PNEUMONIA 07/04/2007   Qualifier: Diagnosis of  By: Larose Kells MD, [place]    Syncope and collapse 08/29/2012   "first time ever"   Patient Active Problem List   Diagnosis Date Noted   Advanced directives, counseling/discussion 01/26/2017    Priority: High   nonobstructive CAD (coronary artery disease) 12/01/2015    Priority: High   Memory loss 12/01/2015    Priority: High   History of adenomatous polyp of colon 08/01/2017    Priority: Medium   Varicose veins of both lower extremities 12/01/2015    Priority: Medium   Hyperlipidemia 05/19/2007    Priority: Medium   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[place]) 05/19/2007    Priority: Medium   Essential hypertension 05/19/2007    Priority: Medium   Insomnia 12/01/2015    Priority: Low   Glaucoma 12/01/2015    Priority: Low   Hammer toe     Priority: Low   Arthritis 08/29/2012    Priority: Low   GERD 09/06/2009    Priority: Low   Edema 09/06/2009    Priority: Low   History of syncope 09/06/2009    Priority: Low   Hyperglycemia 05/19/2007    Priority: Low   Diaphoresis 05/12/2018   Trigger finger, right middle finger 09/05/2017   Primary osteoarthritis of both knees 03/10/2017   Primary osteoarthritis of both hands 03/09/2017   Primary osteoarthritis of both feet 03/09/2017   Rheumatoid factor positive 03/09/2017   Past Surgical History:  Procedure Laterality Date   ABDOMINAL HYSTERECTOMY  1970's   APPENDECTOMY  ~ 1970   CATARACT EXTRACTION W/ INTRAOCULAR LENS  IMPLANT, BILATERAL  ~ 2010   bilateral   TONSILLECTOMY AND ADENOIDECTOMY  1950's    VEIN LIGATION AND STRIPPING  2012   bilaterally    Family History  Problem Relation Age of Onset   Heart disease Mother        brother, father   Diabetes Mother        sister, brother   Suicidality Father        7   Coronary artery disease Brother    Colon cancer Brother    Stroke Brother    Alcoholism Brother    Ovarian cancer Daughter    Esophageal cancer Neg Hx    Rectal cancer Neg Hx    Stomach cancer Neg Hx    Liver cancer Neg Hx     Medications- reviewed and updated Current Outpatient Medications  Medication Sig Dispense Refill   amLODipine (NORVASC) 2.5 MG tablet Take 1 tablet (  2.5 mg total) by mouth daily. 90 tablet 3   aspirin 81 MG tablet Take 81 mg by mouth daily.      atorvastatin (LIPITOR) 80 MG tablet Take 1 tablet (80 mg total) by mouth every evening. 90 tablet 3   diclofenac sodium (VOLTAREN) 1 % GEL Apply 3 gm to 3 large joints up to 3 times a day.Dispense 3 tubes with 3 refills. 3 Tube 3   escitalopram (LEXAPRO) 20 MG tablet Take 1 tablet (20 mg total) by mouth daily. 90 tablet 3   metoprolol succinate (TOPROL XL) 25 MG 24 hr tablet Take 1 tablet (25 mg total) by mouth daily. 90 tablet 3   omeprazole (PRILOSEC) 20 MG capsule Take 1 capsule (20 mg total) by mouth 2 (two) times daily. 180 capsule 3   Biotin w/ Vitamins C & E (HAIR/SKIN/NAILS PO) Take by mouth.     triamcinolone cream (KENALOG) 0.1 % Apply 1 application topically 2 (two) times daily. For 7-10 days maximum 160 g 1   No current facility-administered medications for this visit.     Allergies-reviewed and updated Allergies  Allergen Reactions   Clindamycin/Lincomycin     Severe rash and into throat   Trazodone And Nefazodone Other (See Comments)    drowsiness    Social History   Socioeconomic History   Marital status: Widowed    Spouse name: Not on file   Number of children: 3   Years of education: Not on file   Highest education level: Not on file   Occupational History   Occupation: retired  Scientist, product/process development strain: Not on file   Food insecurity    Worry: Not on file    Inability: Not on Lexicographer needs    Medical: Not on file    Non-medical: Not on file  Tobacco Use   Smoking status: Former Smoker    Packs/day: 1.00    Years: 20.00    Pack years: 20.00    Quit date: 12/27/1978    Years since quitting: 40.9   Smokeless tobacco: Never Used   Tobacco comment: teenager  Substance and Sexual Activity   Alcohol use: No   Drug use: No   Sexual activity: Never  Lifestyle   Physical activity    Days per week: Not on file    Minutes per session: Not on file   Stress: Not on file  Relationships   Social connections    Talks on phone: Not on file    Gets together: Not on file    Attends religious service: Not on file    Active member of club or organization: Not on file    Attends meetings of clubs or organizations: Not on file    Relationship status: Not on file  Other Topics Concern   Not on file  Social History Narrative   Widowed October 29, 2013. Daughter died 29-Mar-2012. 2 living children. 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      Hobbies: gardening, time with dog, time with grandkids      Objective:  BP 130/64    Pulse (!) 57    Temp 97.6 F (36.4 C)    Ht 5\' 2"  (1.575 m)    Wt 198 lb 6.4 oz (90 kg)    SpO2  96%    BMI 36.29 kg/m     Assessment/Plan:  AWV completed- discussed recommended screenings and documented any personalized health advice and referrals for preventive counseling. See AVS as well which was given to patient.   Status of chronic or acute concerns  See CPE from today  Recommended follow up: 1 year AWV     ICD-10-CM   1. Preventative health care  Z00.00    Return precautions advised.  Garret Reddish, MD

## 2019-11-30 NOTE — Progress Notes (Signed)
Phone: [phone]   Subjective:  Patient presents today for their annual physical. Chief complaint-noted.   See problem oriented charting- ROS- full  review of systems was completed and negative except for: feels uncomfortable with mask on- dries nose out and then gets some mild headaches, sleep issues unchanged, sinus irritation with maks.   The following were reviewed and entered/updated in epic: Past Medical History:  Diagnosis Date  . Anginal pain ([place]) 08/29/2012   "have had problems w/this"  . Anxiety 08/29/2012  . Arthritis 08/29/2012   right hand, Seen by Dr. Estanislado Pandy  . CAD (coronary artery disease)    nonobstructive  . Depression   . Exertional dyspnea 08/29/2012  . GERD (gastroesophageal reflux disease)   . Hammer toe    surgery x2, second with Dr. Sharol Given- only other option amputatoin  . HLD (hyperlipidemia)   . HTN (hypertension)   . PNEUMONIA 07/04/2007   Qualifier: Diagnosis of  By: Larose Kells MD, [place] Syncope and collapse 08/29/2012   "first time ever"   Patient Active Problem List   Diagnosis Date Noted  . Advanced directives, counseling/discussion 01/26/2017    Priority: High  . nonobstructive CAD (coronary artery disease) 12/01/2015    Priority: High  . Memory loss 12/01/2015    Priority: High  . History of adenomatous polyp of colon 08/01/2017    Priority: Medium  . Varicose veins of both lower extremities 12/01/2015    Priority: Medium  . Hyperlipidemia 05/19/2007    Priority: Medium  . 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[place]) 05/19/2007    Priority: Medium  . Essential hypertension 05/19/2007    Priority: Medium  . Insomnia 12/01/2015    Priority: Low  . Glaucoma 12/01/2015    Priority: Low  . Hammer toe     Priority: Low  . Arthritis 08/29/2012    Priority: Low  . GERD 09/06/2009    Priority: Low  . Edema 09/06/2009    Priority: Low  . History of syncope 09/06/2009    Priority: Low  . Hyperglycemia 05/19/2007    Priority: Low  .  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 Trigger finger, right middle finger 09/05/2017  . Primary osteoarthritis of both knees 03/10/2017  . Primary osteoarthritis of both hands 03/09/2017  . Primary osteoarthritis of both feet 03/09/2017  . Rheumatoid factor positive 03/09/2017   Past Surgical History:  Procedure Laterality Date  . ABDOMINAL HYSTERECTOMY  1970's  . APPENDECTOMY  ~ 1970  . CATARACT EXTRACTION W/ INTRAOCULAR LENS  IMPLANT, BILATERAL  ~ 2010   bilateral  . TONSILLECTOMY AND ADENOIDECTOMY  1950's  . VEIN LIGATION AND STRIPPING  2012   bilaterally    Family History  Problem Relation Age of Onset  . Heart disease Mother        brother, father  . Diabetes Mother        sister, brother  . Suicidality Father        69  . Coronary artery disease Brother   . Colon cancer Brother   . Stroke Brother   . Alcoholism Brother   . Ovarian cancer Daughter   . Esophageal cancer Neg Hx   . Rectal cancer Neg Hx   . Stomach cancer Neg Hx   . Liver cancer Neg Hx     Medications- reviewed and updated Current Outpatient Medications  Medication Sig Dispense Refill  . amLODipine (NORVASC) 2.5 MG tablet Take 1 tablet (2.5 mg total) by mouth daily. 90 tablet 3  .  aspirin 81 MG tablet Take 81 mg by mouth daily.     Marland Kitchen atorvastatin (LIPITOR) 80 MG tablet Take 1 tablet (80 mg total) by mouth every evening. 90 tablet 3  . diclofenac sodium (VOLTAREN) 1 % GEL Apply 3 gm to 3 large joints up to 3 times a day.Dispense 3 tubes with 3 refills. 3 Tube 3  . escitalopram (LEXAPRO) 20 MG tablet Take 1 tablet (20 mg total) by mouth daily. 90 tablet 3  . metoprolol succinate (TOPROL XL) 25 MG 24 hr tablet Take 1 tablet (25 mg total) by mouth daily. 90 tablet 3  . omeprazole (PRILOSEC) 20 MG capsule Take 1 capsule (20 mg total) by mouth 2 (two) times daily. 180 capsule 3  . Biotin w/ Vitamins C & E (HAIR/SKIN/NAILS PO) Take by mouth.    . triamcinolone cream (KENALOG) 0.1 % Apply 1 application topically 2 (two)  times daily. For 7-10 days maximum 160 g 1   No current facility-administered medications for this visit.     Allergies-reviewed and updated Allergies  Allergen Reactions  . Clindamycin/Lincomycin     Severe rash and into throat  . Trazodone And Nefazodone Other (See Comments)    drowsiness    Social History   Social History Narrative   Widowed 2013-11-05. Daughter died Apr 05, 2012. 2 living children. 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 TM normal. Bridge of nose normal. Eyelids normal.  Neck: no thyromegaly or cervical lymphadenopathy  CV: RRR no murmurs rubs or gallops Lungs: CTAB no crackles, wheeze, rhonchi Abdomen: soft/nontender/nondistended/normal bowel sounds. No rebound or guarding.  Ext: no edema Skin: warm, dry Neuro: grossly normal, moves all extremities, PERRLA   Assessment and Plan   78 y.o. female presenting for annual physical.  Health Maintenance counseling: 1. Anticipatory guidance: Patient counseled regarding regular dental exams yes q6 months, eye exams - yes and follows glaucoma,  avoiding smoking and second hand smoke yes, limiting alcohol to 1 beverage per month or less 2. Risk factor reduction:  Advised patient of need for regular exercise and diet rich and fruits and vegetables to reduce risk of heart attack and stroke. Exercise- 4 days a week @ the [facility] does some swimming. Diet-cooks at home. Trying to eat reasonably healthy. Down from 207 last 2023/04/06 to 198- I encouraged her to continue slowsteady weight loss Wt Readings from Last 3  Encounters:  11/30/19 198 lb 6.4 oz (90 kg)  10/02/19 199 lb (90.3 kg)  09/24/19 199 lb (90.3 kg)  3. Immunizations/screenings/ancillary studies- discussed shingles sot but was really costly.  Immunization History  Administered Date(s) Administered  . Influenza, High Dose Seasonal PF 08/24/2016, 09/28/2018  . Influenza-Unspecified 11/12/2015, 09/25/2019  . Pneumococcal Conjugate-13 08/24/2016  . Pneumococcal Polysaccharide-23 12/29/1999  . Pneumococcal-Unspecified 08/27/2010  . Td 12/28/2005, 10/26/2016  4. Cervical cancer screening- never had abnormal pap smear, past age based screening 5. Breast cancer screening-  breast exam yes self exams  and mammogram - she has decided to discontinue these checks.  6. Colon cancer screening -History of adenomatous polyp of colon.  11/03/2017 with 3-year repeat planned  7. Skin cancer screening- no dermatologist. advised regular sunscreen use. Denies worrisome, changing, or new skin lesions.  8. Birth control/STD check- not dating.  9. Osteoporosis screening at 55- 11/19/2014.  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 Wearing her mask at the polls and at the gym (other than swimming)   Coronary artery disease involving native coronary artery of native heart without angina pectoris. Reassuring low risk stress test on 10/02/2019 thankfully. Chest pains with walking up hills are stable. Not better on the metoprolol . Continue asa and statin.   Memory loss- memory stable recently. MMSE 2016 of 2017 and by last year was 29/30. Has felt sharper with well controlled depression. Exercise probably helps as well.   Pure hypercholesterolemia-patient compliant with atorvastatin 80 mg daily.  Also on aspirin 81 mg for nonobstructive CAD.  We referred her back to Dr.  Stanford Breed in July. Ideal LDL goal is under 70- has been slightly high but working on lifestyle changes Lab Results  Component Value Date   CHOL 167 10/27/2018   HDL 72.30 10/27/2018   LDLCALC 78 10/27/2018   LDLDIRECT 79.0 08/01/2017   TRIG 81.0 10/27/2018   CHOLHDL 2 10/27/2018    Major depressive disorder with single episode, in full remission (HCC)-reasonable control on escitalopram 20 mg. PHQ9 of 3 today- denies moving slowly. -regardless she is ready for covid 19 to be over.    Primary insomnia-has not slept well since husband and daughter died- still visits cemetary.    Essential hypertension-currently controlled with amlodipine 2.5 mg and metoprolol per Dr. Stanford Breed -have advised home monitoring in past- home #s were off somewhat so doesn't regularly check.   GERD-takes omeprazole 20 mg twice a day.  Sparingly gets breakthrough so we have not been able to reduce dose or with really heavy meal.  Will update B12 level today. No results found for: VITAMINB12  Intermittent mild rash on arms- wonders if from gardening- triamcinolone helps if needed  Arthritis followed by Dr. Estanislado Pandy  Recommended follow up: Return in about 6 months (around 05/30/2020) for follow up- or sooner if needed. Future Appointments  Date Time Provider [place]  01/11/2020 10:20 AM Ofilia Neas, PA-C [facility] None   Lab/Order associations: fasting   ICD-10-CM   1. Preventative health care  Z00.00   2. Coronary artery disease involving native coronary artery of native heart without angina pectoris  I25.10   3. Memory loss  R41.3   4. Pure hypercholesterolemia  E78.00   5. History of adenomatous polyp of colon  Z86.010   6. Major depressive disorder with single episode, in full remission ([place] [address])  F32.5   7. Essential hypertension  I10   8. Primary insomnia  F51.01   9. Glaucoma, unspecified glaucoma type, unspecified laterality  H40.9   10. Arthritis  M19.90   11. High risk medication use  Z79.899      No orders of the defined types were placed in this encounter.   Return precautions advised.  Garret Reddish, MD

## 2019-11-30 NOTE — Patient Instructions (Addendum)
  [name] , Thank you for taking time to come for your Medicare Wellness Visit. I appreciate your ongoing commitment to your health goals. Please review the following plan we discussed and let me know if I can assist you in the future.   These are the goals we discussed: 1. Keep up great job with exercise.    This is a list of the screening recommended for you and due dates:  Health Maintenance  Topic Date Due  . Colon Cancer Screening  11/03/2020  . Tetanus Vaccine  10/26/2026  . Flu Shot  Completed  . DEXA scan (bone density measurement)  Completed  . Pneumonia vaccines  Completed    Please stop by lab before you go If you do not have mychart- we will call you about results within 5 business days of [place] receiving them.  If you have mychart- we will send your results within 3 business days of [place] receiving them.  If abnormal or we want to clarify a result, we will call or mychart you to make sure you receive the message.  If you have questions or concerns or don't hear within 5-7 days, please send [place] a message or call us.

## 2019-11-30 NOTE — Addendum Note (Signed)
Addended by: Tomi Likens on: 11/30/2019 11:48 AM   Modules accepted: Orders

## 2020-01-01 ENCOUNTER — Encounter: Payer: Self-pay | Admitting: Family Medicine

## 2020-01-08 ENCOUNTER — Telehealth: Payer: Self-pay

## 2020-01-08 NOTE — Telephone Encounter (Signed)
Bradly Bienenstock Key: [id number] - PA Case ID: [id number] - Rx #[id number] Need help? Call us at [phone] Status Sent to [facility] Omeprazole 20MG  dr capsules Form [employer] Electronic PA Form Original Claim Info 25 MD CALL [phone] MAXIMUM DAILY DOSE OF 1.0000

## 2020-01-09 NOTE — Telephone Encounter (Signed)
Auth received for Omeprazole 20 mg #180 for 90 days  12/28/2019 to 12/26/2020

## 2020-01-11 ENCOUNTER — Ambulatory Visit: Payer: Medicare HMO | Admitting: Physician Assistant

## 2020-01-21 ENCOUNTER — Ambulatory Visit: Payer: Medicare HMO | Attending: Internal Medicine

## 2020-01-21 DIAGNOSIS — Z23 Encounter for immunization: Secondary | ICD-10-CM | POA: Insufficient documentation

## 2020-01-21 NOTE — Progress Notes (Signed)
   Covid-19 Vaccination Clinic  Name:  [name]    MRN: [id number] DOB: 1941-07-14  01/21/2020  [name] was observed post Covid-19 immunization for 15 minutes without incidence. She was provided with Vaccine Information Sheet and instruction to access the V-Safe system.   [name] was instructed to call 911 with any severe reactions post vaccine: Marland Kitchen Difficulty breathing  . Swelling of your face and throat  . A fast heartbeat  . A bad rash all over your body  . Dizziness and weakness    Immunizations Administered    Name Date Dose VIS Date Route   Pfizer COVID-19 Vaccine 01/21/2020 12:17 PM 0.3 mL 12/07/2019 Intramuscular   Manufacturer: [place]   Lot: [id number]   [place]: [id number]

## 2020-01-25 NOTE — Telephone Encounter (Signed)
error 

## 2020-01-29 ENCOUNTER — Encounter: Payer: Self-pay | Admitting: Family Medicine

## 2020-02-11 ENCOUNTER — Ambulatory Visit: Payer: Medicare HMO | Attending: Internal Medicine

## 2020-02-11 DIAGNOSIS — Z23 Encounter for immunization: Secondary | ICD-10-CM | POA: Insufficient documentation

## 2020-02-11 NOTE — Progress Notes (Signed)
   Covid-19 Vaccination Clinic  Name:  [name]    MRN: [id number] DOB: March 24, 1941  02/11/2020  [name] was observed post Covid-19 immunization for 15 minutes without incidence. She was provided with Vaccine Information Sheet and instruction to access the V-Safe system.   [name] was instructed to call 911 with any severe reactions post vaccine: Marland Kitchen Difficulty breathing  . Swelling of your face and throat  . A fast heartbeat  . A bad rash all over your body  . Dizziness and weakness    Immunizations Administered    Name Date Dose VIS Date Route   Pfizer COVID-19 Vaccine 02/11/2020 11:51 AM 0.3 mL 12/07/2019 Intramuscular   Manufacturer: [place]   Lot: [id number]   [place]: [id number]

## 2020-02-12 ENCOUNTER — Telehealth: Payer: Self-pay | Admitting: *Deleted

## 2020-02-12 NOTE — Telephone Encounter (Signed)
A message was left, re: her follow up visit. 

## 2020-02-15 ENCOUNTER — Ambulatory Visit: Payer: Medicare HMO | Admitting: Physician Assistant

## 2020-02-19 NOTE — Progress Notes (Deleted)
Office Visit Note  Patient: [name]             Date of Birth: 02-25-1941           MRN: [id number]             PCP: Marin Olp, MD Referring: Marin Olp, MD Visit Date: 02/25/2020 Occupation: @GUAROCC @  Subjective:  No chief complaint on file.   History of Present Illness: [name] is a 79 y.o. female ***   Activities of Daily Living:  Patient reports morning stiffness for *** {minute/hour:19697}.   Patient {ACTIONS;DENIES/REPORTS:21021675::"Denies"} nocturnal pain.  Difficulty dressing/grooming: {ACTIONS;DENIES/REPORTS:21021675::"Denies"} Difficulty climbing stairs: {ACTIONS;DENIES/REPORTS:21021675::"Denies"} Difficulty getting out of chair: {ACTIONS;DENIES/REPORTS:21021675::"Denies"} Difficulty using hands for taps, buttons, cutlery, and/or writing: {ACTIONS;DENIES/REPORTS:21021675::"Denies"}  No Rheumatology ROS completed.   PMFS History:  Patient Active Problem List   Diagnosis Date Noted  . 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 Trigger finger, right middle finger 09/05/2017  . History of adenomatous polyp of colon 08/01/2017  . Primary osteoarthritis of both knees 03/10/2017  . Primary osteoarthritis of both hands 03/09/2017  . Primary osteoarthritis of both feet 03/09/2017  . Rheumatoid factor positive 03/09/2017  . Advanced directives, counseling/discussion 01/26/2017  . nonobstructive CAD (coronary artery disease) 12/01/2015  . 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 Insomnia 12/01/2015  . Glaucoma 12/01/2015  . Memory loss 12/01/2015  . Hammer toe   . Arthritis 08/29/2012  . GERD 09/06/2009  . Edema 09/06/2009  . History of syncope 09/06/2009  . Hyperlipidemia 05/19/2007  . 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[place]) 05/19/2007  . Essential hypertension 05/19/2007  . Hyperglycemia 05/19/2007    Past Medical History:  Diagnosis Date  . Anginal pain ([place]) 08/29/2012   "have had problems w/this"  . Anxiety 08/29/2012  .  Arthritis 08/29/2012   right hand, Seen by Dr. Estanislado Pandy  . CAD (coronary artery disease)    nonobstructive  . Depression   . Exertional dyspnea 08/29/2012  . GERD (gastroesophageal reflux disease)   . Hammer toe    surgery x2, second with Dr. Sharol Given- only other option amputatoin  . HLD (hyperlipidemia)   . HTN (hypertension)   . PNEUMONIA 07/04/2007   Qualifier: Diagnosis of  By: Larose Kells MD, [place] Syncope and collapse 08/29/2012   "first time ever"    Family History  Problem Relation Age of Onset  . Heart disease Mother        brother, father  . Diabetes Mother        sister, brother  . Suicidality Father        101  . Coronary artery disease Brother   . Colon cancer Brother   . Stroke Brother   . Alcoholism Brother   . Ovarian cancer Daughter   . Esophageal cancer Neg Hx   . Rectal cancer Neg Hx   . Stomach cancer Neg Hx   . Liver cancer Neg Hx    Past Surgical History:  Procedure Laterality Date  . ABDOMINAL HYSTERECTOMY  1970's  . APPENDECTOMY  ~ 1970  . CATARACT EXTRACTION W/ INTRAOCULAR LENS  IMPLANT, BILATERAL  ~ March 23, 2009   bilateral  . TONSILLECTOMY AND ADENOIDECTOMY  1950's  . VEIN LIGATION AND STRIPPING  03-24-11   bilaterally   Social History   Social History Narrative   Widowed October 23, 2013. Daughter died Mar 23, 2012. 2 living children. 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 Influenza, High Dose Seasonal PF 08/24/2016, 09/28/2018  . Influenza-Unspecified 11/12/2015, 09/25/2019  . PFIZER SARS-COV-2 Vaccination 01/21/2020, 02/11/2020  . Pneumococcal Conjugate-13 08/24/2016  . Pneumococcal Polysaccharide-23 12/29/1999  . Pneumococcal-Unspecified 08/27/2010  . Td 12/28/2005, 10/26/2016     Objective: Vital Signs: There  were no vitals taken for this visit.   Physical Exam   Musculoskeletal Exam: ***  CDAI Exam: CDAI Score: -- Patient Global: --; Provider Global: -- Swollen: --; Tender: -- Joint Exam 02/25/2020   No joint exam has been documented for this visit   There is currently no information documented on the homunculus. Go to the Rheumatology activity and complete the homunculus joint exam.  Investigation: No additional findings.  Imaging: No results found.  Recent Labs: Lab Results  Component Value Date   WBC 6.2 11/30/2019   HGB 13.5 11/30/2019   PLT 250.0 11/30/2019   NA 139 11/30/2019   K 4.2 11/30/2019   CL 103 11/30/2019   CO2 28 11/30/2019   GLUCOSE 88 11/30/2019   BUN 19 11/30/2019   CREATININE 0.74 11/30/2019   BILITOT 1.1 11/30/2019   ALKPHOS 66 11/30/2019   AST 25 11/30/2019   ALT 21 11/30/2019   PROT 6.7 11/30/2019   ALBUMIN 4.1 11/30/2019   CALCIUM 9.1 11/30/2019   GFRAA 83 (L) 08/30/2012    Speciality Comments: No specialty comments available.  Procedures:  No procedures performed Allergies: Clindamycin/lincomycin and Trazodone and nefazodone   Assessment / Plan:     Visit Diagnoses: No diagnosis found.  Orders: No orders of the defined types were placed in this encounter.  No orders of the defined types were placed in this encounter.   Face-to-face time spent with patient was *** minutes. Greater than 50% of time was spent in counseling and coordination of care.  Follow-Up Instructions: No follow-ups on file.   Earnestine Mealing, CMA  Note - This record has been created using Editor, commissioning.  Chart creation errors have been sought, but may not always  have been located. Such creation errors do not reflect on  the standard of medical care.

## 2020-02-25 ENCOUNTER — Ambulatory Visit: Payer: Medicare HMO | Admitting: Physician Assistant

## 2020-03-13 ENCOUNTER — Ambulatory Visit: Payer: Medicare HMO

## 2020-03-14 DIAGNOSIS — Z961 Presence of intraocular lens: Secondary | ICD-10-CM | POA: Diagnosis not present

## 2020-03-14 DIAGNOSIS — H353131 Nonexudative age-related macular degeneration, bilateral, early dry stage: Secondary | ICD-10-CM | POA: Diagnosis not present

## 2020-03-14 DIAGNOSIS — H5203 Hypermetropia, bilateral: Secondary | ICD-10-CM | POA: Diagnosis not present

## 2020-03-19 DIAGNOSIS — Z0100 Encounter for examination of eyes and vision without abnormal findings: Secondary | ICD-10-CM | POA: Diagnosis not present

## 2020-04-21 ENCOUNTER — Emergency Department (HOSPITAL_COMMUNITY)
Admission: EM | Admit: 2020-04-21 | Discharge: 2020-04-22 | Disposition: A | Discharge status: Home / Self Care | Payer: Medicare HMO | Attending: Emergency Medicine | Admitting: Emergency Medicine

## 2020-04-21 ENCOUNTER — Emergency Department (HOSPITAL_COMMUNITY): Payer: Medicare HMO

## 2020-04-21 ENCOUNTER — Other Ambulatory Visit: Payer: Self-pay

## 2020-04-21 ENCOUNTER — Telehealth: Payer: Self-pay | Admitting: Family Medicine

## 2020-04-21 ENCOUNTER — Encounter (HOSPITAL_COMMUNITY): Payer: Self-pay

## 2020-04-21 DIAGNOSIS — R479 Unspecified speech disturbances: Secondary | ICD-10-CM | POA: Diagnosis not present

## 2020-04-21 DIAGNOSIS — R270 Ataxia, unspecified: Secondary | ICD-10-CM | POA: Diagnosis not present

## 2020-04-21 DIAGNOSIS — Z7982 Long term (current) use of aspirin: Secondary | ICD-10-CM | POA: Diagnosis not present

## 2020-04-21 DIAGNOSIS — Z79899 Other long term (current) drug therapy: Secondary | ICD-10-CM | POA: Diagnosis not present

## 2020-04-21 DIAGNOSIS — R29818 Other symptoms and signs involving the nervous system: Secondary | ICD-10-CM | POA: Diagnosis not present

## 2020-04-21 DIAGNOSIS — R519 Headache, unspecified: Secondary | ICD-10-CM | POA: Insufficient documentation

## 2020-04-21 DIAGNOSIS — R42 Dizziness and giddiness: Secondary | ICD-10-CM | POA: Diagnosis not present

## 2020-04-21 DIAGNOSIS — N3000 Acute cystitis without hematuria: Secondary | ICD-10-CM | POA: Diagnosis not present

## 2020-04-21 DIAGNOSIS — Z87891 Personal history of nicotine dependence: Secondary | ICD-10-CM | POA: Insufficient documentation

## 2020-04-21 DIAGNOSIS — I2510 Atherosclerotic heart disease of native coronary artery without angina pectoris: Secondary | ICD-10-CM | POA: Insufficient documentation

## 2020-04-21 DIAGNOSIS — I10 Essential (primary) hypertension: Secondary | ICD-10-CM | POA: Diagnosis not present

## 2020-04-21 DIAGNOSIS — R531 Weakness: Secondary | ICD-10-CM | POA: Insufficient documentation

## 2020-04-21 LAB — CBC
HCT: 39.5 % (ref 36.0–46.0)
Hemoglobin: 13.3 g/dL (ref 12.0–15.0)
MCH: 30.2 pg (ref 26.0–34.0)
MCHC: 33.7 g/dL (ref 30.0–36.0)
MCV: 89.8 fL (ref 80.0–100.0)
Platelets: 248 10*3/uL (ref 150–400)
RBC: 4.4 MIL/uL (ref 3.87–5.11)
RDW: 13.2 % (ref 11.5–15.5)
WBC: 6 10*3/uL (ref 4.0–10.5)
nRBC: 0 % (ref 0.0–0.2)

## 2020-04-21 LAB — DIFFERENTIAL
Abs Immature Granulocytes: 0.02 10*3/uL (ref 0.00–0.07)
Basophils Absolute: 0 10*3/uL (ref 0.0–0.1)
Basophils Relative: 1 %
Eosinophils Absolute: 0.1 10*3/uL (ref 0.0–0.5)
Eosinophils Relative: 2 %
Immature Granulocytes: 0 %
Lymphocytes Relative: 32 %
Lymphs Abs: 1.9 10*3/uL (ref 0.7–4.0)
Monocytes Absolute: 0.3 10*3/uL (ref 0.1–1.0)
Monocytes Relative: 5 %
Neutro Abs: 3.6 10*3/uL (ref 1.7–7.7)
Neutrophils Relative %: 60 %

## 2020-04-21 LAB — COMPREHENSIVE METABOLIC PANEL
ALT: 20 U/L (ref 0–44)
AST: 28 U/L (ref 15–41)
Albumin: 4 g/dL (ref 3.5–5.0)
Alkaline Phosphatase: 64 U/L (ref 38–126)
Anion gap: 9 (ref 5–15)
BUN: 17 mg/dL (ref 8–23)
CO2: 27 mmol/L (ref 22–32)
Calcium: 9.2 mg/dL (ref 8.9–10.3)
Chloride: 105 mmol/L (ref 98–111)
Creatinine, Ser: 0.69 mg/dL (ref 0.44–1.00)
GFR calc Af Amer: >60 mL/min (ref >60)
GFR calc non Af Amer: >60 mL/min (ref >60)
Glucose, Bld: 100 mg/dL — ABNORMAL HIGH (ref 70–99)
Potassium: 4 mmol/L (ref 3.5–5.1)
Sodium: 141 mmol/L (ref 135–145)
Total Bilirubin: 1.2 mg/dL (ref 0.3–1.2)
Total Protein: 6.7 g/dL (ref 6.5–8.1)

## 2020-04-21 LAB — APTT: aPTT: 32 seconds (ref 24–36)

## 2020-04-21 LAB — PROTIME-INR
INR: 1 (ref 0.8–1.2)
Prothrombin Time: 13.1 seconds (ref 11.4–15.2)

## 2020-04-21 MED ORDER — SODIUM CHLORIDE 0.9% FLUSH: 3.0000 mL | Freq: Once | INTRAVENOUS | Status: DC

## 2020-04-21 NOTE — Telephone Encounter (Signed)
FYI. Pt went to ED 

## 2020-04-21 NOTE — Telephone Encounter (Signed)
Nurse Assessment Nurse: Zorita Pang, RN, [place] Date/Time (Eastern Time): 04/21/2020 11:18:15 AM Confirm and document reason for call. If symptomatic, describe symptoms. ---Caller had dizziness when she got up to go to the bathroom felt like her body was going to the right and she wanted to stand up straight. Shaky now. Has the patient had close contact with a person known or suspected to have the novel coronavirus illness OR traveled / lives in area with major community spread (including international travel) in the last 14 days from the onset of symptoms? * If Asymptomatic, screen for exposure and travel within the last 14 days. ---No Does the patient have any new or worsening symptoms? ---Yes Will a triage be completed? ---Yes Related visit to physician within the last 2 weeks? ---No Does the PT have any chronic conditions? (i.e. diabetes, asthma, this includes High risk factors for pregnancy, etc.) ---Yes List chronic conditions. ---atorvastatin for lipedemia, amlodipine, B complex vitamin, ASA baby, prilosec Is this a behavioral health or substance abuse call? ---No Guidelines Guideline Title Affirmed Question Affirmed Notes Nurse Date/Time (Eastern Time) Dizziness - Lightheadedness [1] Numbness (i.e., loss of sensation) of the face, arm / hand, or leg / foot Womble, RN, Neoma Laming 04/21/2020 11:22:12 AMPLEASE NOTE: All timestamps contained within this report are represented as [place] Standard Time. CONFIDENTIALTY NOTICE: This fax transmission is intended only for the addressee. It contains information that is legally privileged, confidential or otherwise protected from use or disclosure. If you are not the intended recipient, you are strictly prohibited from reviewing, disclosing, copying using or disseminating any of this information or taking any action in reliance on or regarding this information. If you have received this fax in error, please notify us immediately by telephone so  that we can arrange for its return to [place]. Phone: [phone], Toll-Free: [phone], Fax: [phone] Page: 2 of 2 Call Id: [id number] Guidelines Guideline Title Affirmed Question Affirmed Notes Nurse Date/Time Eilene Ghazi Time) on one side of the body AND [2] sudden onset AND [3] brief (now gone) Disp. Time Eilene Ghazi Time) Disposition Final User 04/21/2020 11:31:15 AM Go to ED Now (or PCP triage) Yes Zorita Pang, RN, Garrel Ridgel Disagree/Comply Disagree Caller Understands Yes PreDisposition Go to ED Care Advice Given Per Guideline GO TO ED NOW (OR PCP TRIAGE): * It is better and safer if another adult drives instead of you. Comments User: Marquis Buggy, RN Date/Time Eilene Ghazi Time): 04/21/2020 11:33:01 AM Caller refused to go to ED. Called the backline at her request and the office states that there are no appointments but she can try going to [facility]. The caller was told about UC and that they may refer her to ER if indicated.

## 2020-04-21 NOTE — ED Triage Notes (Signed)
Patient reports that she went to bed last night at 2000 and got up at 0100 and reports shaking and bilateral leg weakness but noticed more on right. Patient also reports that she had a loss of words and describes expressive aphasia yesterday afternoon that has resolved. On assessment no drift, speech clear. No facial droop

## 2020-04-22 ENCOUNTER — Emergency Department (HOSPITAL_COMMUNITY): Payer: Medicare HMO

## 2020-04-22 DIAGNOSIS — N3000 Acute cystitis without hematuria: Secondary | ICD-10-CM | POA: Diagnosis not present

## 2020-04-22 DIAGNOSIS — R42 Dizziness and giddiness: Secondary | ICD-10-CM | POA: Diagnosis not present

## 2020-04-22 DIAGNOSIS — R270 Ataxia, unspecified: Secondary | ICD-10-CM | POA: Diagnosis not present

## 2020-04-22 LAB — URINALYSIS, ROUTINE W REFLEX MICROSCOPIC
Bilirubin Urine: NEGATIVE
Glucose, UA: NEGATIVE mg/dL
Hgb urine dipstick: NEGATIVE
Ketones, ur: NEGATIVE mg/dL
Nitrite: NEGATIVE
Protein, ur: NEGATIVE mg/dL
Specific Gravity, Urine: 1.019 (ref 1.005–1.030)
WBC, UA: >50 WBC/hpf — ABNORMAL HIGH (ref 0–5)
pH: 6 (ref 5.0–8.0)

## 2020-04-22 LAB — RAPID URINE DRUG SCREEN, HOSP PERFORMED
Amphetamines: NOT DETECTED
Barbiturates: NOT DETECTED
Benzodiazepines: NOT DETECTED
Cocaine: NOT DETECTED
Opiates: NOT DETECTED
Tetrahydrocannabinol: NOT DETECTED

## 2020-04-22 MED ORDER — LORAZEPAM 1 MG PO TABS
0.5000 mg | ORAL_TABLET | ORAL | Status: DC | PRN
  Administered 2020-04-22: 0.5 mg via ORAL
  Filled 2020-04-22: qty 1

## 2020-04-22 MED ORDER — CEPHALEXIN 500 MG PO CAPS
500.0000 mg | ORAL_CAPSULE | Freq: Two times a day (BID) | ORAL | 0 refills | Status: DC
Start: 2020-04-22 — End: 2020-05-14

## 2020-04-22 NOTE — ED Notes (Signed)
Patient transported to MRI 

## 2020-04-22 NOTE — ED Provider Notes (Signed)
[facility] EMERGENCY DEPARTMENT Provider Note   CSN: [place]:[id number] Arrival date & time: 04/21/20  1251     History Chief Complaint  Patient presents with  . weakness/ shakiness    [name] is a 79 y.o. female.  The history is provided by the patient.  Neurologic Problem This is a new problem. The current episode started 12 to 24 hours ago. The problem occurs constantly. The problem has been gradually improving. Associated symptoms include headaches. Pertinent negatives include no chest pain, no abdominal pain and no shortness of breath. The symptoms are aggravated by walking. Nothing relieves the symptoms. She has tried nothing for the symptoms.  Patient with history of CAD, depression, GERD presents with dizziness and weakness.  She reports that approximately 1 AM on April 26 she woke up and felt dizzy.  She tried going to the restroom and she was leaning to the right.  She did not fall, but had to hold onto the bed to walk.  She went back to bed and woke up later that morning approximately 8 AM and the symptoms continued.  She also reports that she had loss of words and difficulty speaking at times.  No focal weakness was reported.  No falls.  No new meds.  She reports things are improving, but still feels slightly dizzy upon ambulating.  Denies known history of stroke. Patient reports being very active and goes to the [facility] 5 days a week    Past Medical History:  Diagnosis Date  . Anginal pain ([place]) 08/29/2012   "have had problems w/this"  . Anxiety 08/29/2012  . Arthritis 08/29/2012   right hand, Seen by Dr. Estanislado Pandy  . CAD (coronary artery disease)    nonobstructive  . Depression   . Exertional dyspnea 08/29/2012  . GERD (gastroesophageal reflux disease)   . Hammer toe    surgery x2, second with Dr. Sharol Given- only other option amputatoin  . HLD (hyperlipidemia)   . HTN (hypertension)   . PNEUMONIA 07/04/2007   Qualifier: Diagnosis of  By: Larose Kells MD, [place]  Syncope and collapse 08/29/2012   "first time ever"    Patient Active Problem List   Diagnosis Date Noted  . 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 Trigger finger, right middle finger 09/05/2017  . History of adenomatous polyp of colon 08/01/2017  . Primary osteoarthritis of both knees 03/10/2017  . Primary osteoarthritis of both hands 03/09/2017  . Primary osteoarthritis of both feet 03/09/2017  . Rheumatoid factor positive 03/09/2017  . Advanced directives, counseling/discussion 01/26/2017  . nonobstructive CAD (coronary artery disease) 12/01/2015  . 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 Insomnia 12/01/2015  . Glaucoma 12/01/2015  . Memory loss 12/01/2015  . Hammer toe   . Arthritis 08/29/2012  . GERD 09/06/2009  . Edema 09/06/2009  . History of syncope 09/06/2009  . Hyperlipidemia 05/19/2007  . 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 Essential hypertension 05/19/2007  . Hyperglycemia 05/19/2007    Past Surgical History:  Procedure Laterality Date  . ABDOMINAL HYSTERECTOMY  1970's  . APPENDECTOMY  ~ 1970  . CATARACT EXTRACTION W/ INTRAOCULAR LENS  IMPLANT, BILATERAL  ~ 2010   bilateral  . TONSILLECTOMY AND ADENOIDECTOMY  1950's  . VEIN LIGATION AND STRIPPING  2012   bilaterally     OB History   No obstetric history on file.     Family History  Problem Relation Age of Onset  . Heart disease Mother  brother, father  . Diabetes Mother        sister, brother  . Suicidality Father        38  . Coronary artery disease Brother   . Colon cancer Brother   . Stroke Brother   . Alcoholism Brother   . Ovarian cancer Daughter   . Esophageal cancer Neg Hx   . Rectal cancer Neg Hx   . Stomach cancer Neg Hx   . Liver cancer Neg Hx     Social History   Tobacco Use  . Smoking status: Former Smoker    Packs/day: 1.00    Years: 20.00    Pack years: 20.00    Quit date: 12/27/1978    Years since quitting: 41.3  . Smokeless tobacco: Never Used   . Tobacco comment: teenager  Substance Use Topics  . Alcohol use: No  . Drug use: No    Home Medications Prior to Admission medications   Medication Sig Start Date End Date Taking? Authorizing Provider  amLODipine (NORVASC) 2.5 MG tablet Take 1 tablet (2.5 mg total) by mouth daily. 07/13/19   Marin Olp, MD  aspirin 81 MG tablet Take 81 mg by mouth daily.     [provider]  atorvastatin (LIPITOR) 80 MG tablet Take 1 tablet (80 mg total) by mouth every evening. 07/06/19   Marin Olp, MD  Biotin w/ Vitamins C & E (HAIR/SKIN/NAILS PO) Take by mouth.    [provider]  diclofenac sodium (VOLTAREN) 1 % GEL Apply 3 gm to 3 large joints up to 3 times a day.Dispense 3 tubes with 3 refills. 09/15/18   Ofilia Neas, PA-C  escitalopram (LEXAPRO) 20 MG tablet Take 1 tablet (20 mg total) by mouth daily. 07/06/19   Marin Olp, MD  metoprolol succinate (TOPROL XL) 25 MG 24 hr tablet Take 1 tablet (25 mg total) by mouth daily. 09/24/19   Lelon Perla, MD  omeprazole (PRILOSEC) 20 MG capsule Take 1 capsule (20 mg total) by mouth 2 (two) times daily. 07/06/19   Marin Olp, MD  triamcinolone cream (KENALOG) 0.1 % Apply 1 application topically 2 (two) times daily. For 7-10 days maximum 08/10/19   Marin Olp, MD    Allergies    Clindamycin/lincomycin and Trazodone and nefazodone  Review of Systems   Review of Systems  Constitutional: Negative for fever.  Eyes: Negative for visual disturbance.  Respiratory: Negative for shortness of breath.   Cardiovascular: Negative for chest pain.  Gastrointestinal: Negative for abdominal pain.  Neurological: Positive for dizziness, speech difficulty and headaches. Negative for syncope.  All other systems reviewed and are negative.   Physical Exam Updated Vital Signs BP (!) 147/76   Pulse 60   Temp 98.4 F (36.9 C) (Oral)   Resp 15   Ht 1.626 m (5\' 4" )   Wt 89.4 kg   SpO2 98%   BMI 33.81 kg/m    Physical Exam CONSTITUTIONAL: Well developed/well nourished HEAD: Normocephalic/atraumatic EYES: EOMI/PERRL, no nystagmus, no visual field deficit  no ptosis ENMT: Mucous membranes moist NECK: supple no meningeal signs, no bruits CV: S1/S2 noted, soft murmur noted LUNGS: Lungs are clear to auscultation bilaterally, no apparent distress ABDOMEN: soft, nontender, no rebound or guarding GU:no cva tenderness NEURO:Awake/alert, face symmetric, no arm or leg drift is noted Equal 5/5 strength with shoulder abduction, elbow flex/extension, wrist flex/extension in upper extremities and equal hand grips bilaterally Equal 5/5 strength with hip flexion,knee flex/extension, foot dorsi/plantar  flexion Cranial nerves 3/4/5/6/07/04/09/11/12 tested and intact Gait normal without ataxia No past pointing Sensation to light touch intact in all extremities EXTREMITIES: pulses normal, full ROM SKIN: warm, color normal PSYCH: no abnormalities of mood noted  ED Results / Procedures / Treatments   Labs (all labs ordered are listed, but only abnormal results are displayed) Labs Reviewed  COMPREHENSIVE METABOLIC PANEL - Abnormal; Notable for the following components:      Result Value   Glucose, Bld 100 (*)    All other components within normal limits  URINALYSIS, ROUTINE W REFLEX MICROSCOPIC - Abnormal; Notable for the following components:   APPearance HAZY (*)    Leukocytes,Ua LARGE (*)    WBC, UA >50 (*)    Bacteria, UA RARE (*)    Non Squamous Epithelial 0-5 (*)    All other components within normal limits  URINE CULTURE  PROTIME-INR  APTT  CBC  DIFFERENTIAL  RAPID URINE DRUG SCREEN, HOSP PERFORMED    EKG EKG Interpretation  Date/Time:  Monday April 21 2020 12:55:31 EDT Ventricular Rate:  78 PR Interval:  148 QRS Duration: 88 QT Interval:  396 QTC Calculation: 451 R Axis:   74 Text Interpretation: Normal sinus rhythm Nonspecific ST abnormality Abnormal ECG No significant change  since last tracing Confirmed by Ripley Fraise [phone]) on 04/22/2020 3:57:08 AM   Radiology CT HEAD WO CONTRAST  Result Date: 04/21/2020 CLINICAL DATA:  Neuro deficit, acute, stroke suspected Possible stroke Patient reports bilateral leg weakness, right greater than left. EXAM: CT HEAD WITHOUT CONTRAST TECHNIQUE: Contiguous axial images were obtained from the base of the skull through the vertex without intravenous contrast. COMPARISON:  Head CT 08/29/2012 FINDINGS: Brain: No intracranial hemorrhage, mass effect, or midline shift. Age related atrophy. No hydrocephalus. The basilar cisterns are patent. Mild periventricular and deep white matter hypodensity most consistent with chronic small vessel ischemia. Bilateral basal gangliar mineralization is likely senescent unchanged. No evidence of territorial infarct or acute ischemia. No extra-axial or intracranial fluid collection. Vascular: No hyperdense vessel or unexpected calcification. Skull: No fracture or focal lesion. Sinuses/Orbits: No acute findings. Minor mucosal thickening of ethmoid air cells. Mastoid air cells are clear. Bilateral cataract resection. Other: None. IMPRESSION: 1. No acute intracranial abnormality. 2. Age related atrophy and chronic small vessel ischemia. Electronically Signed   By: Keith Rake M.D.   On: 04/21/2020 15:09   MR BRAIN WO CONTRAST  Result Date: 04/22/2020 CLINICAL DATA:  Ataxia with stroke suspected EXAM: MRI HEAD WITHOUT CONTRAST TECHNIQUE: Multiplanar, multiecho pulse sequences of the brain and surrounding structures were obtained without intravenous contrast. COMPARISON:  Head CT from yesterday FINDINGS: Brain: No acute infarction, hemorrhage, hydrocephalus, extra-axial collection or mass lesion. Focus of mineralization/remote hemorrhage in the right posterior cerebrum, nonspecific in isolation. Mild chronic small vessel ischemic change in the deep cerebral white matter. Normal brain volume Vascular: Normal  flow voids. Skull and upper cervical spine: Normal marrow signal. There is cervical degenerative facet spurring with C2-3 ankylosis. Sinuses/Orbits: Bilateral cataract resection. IMPRESSION: No acute finding, including infarct. Electronically Signed   By: Monte Fantasia M.D.   On: 04/22/2020 06:28    Procedures Procedures Medications Ordered in ED Medications  sodium chloride flush (NS) 0.9 % injection 3 mL (has no administration in time range)  LORazepam (ATIVAN) tablet 0.5 mg (0.5 mg Oral Given 04/22/20 [id number])    ED Course  I have reviewed the triage vital signs and the nursing notes.  Pertinent labs & imaging results that were available  during my care of the patient were reviewed by me and considered in my medical decision making (see chart for details).    MDM Rules/Calculators/A&P                      4:39 AM Patient presents with feeling shaky and weak that started over 24 hours ago.  On my assessment there is no focal neurologic deficit and she is ambulatory without difficulty.  However subjectively she still feels that she is leaning to the right. Consult neurology tPA in stroke considered but not given due to: Onset over 3-4.5hours Symptoms resolved 4:44 AM Discussed with Dr. Leonel Ramsay with neurology.  Recommends MRI brain.  If negative, patient would likely be appropriate for discharge home 6:59 AM MRI negative.  Overall patient appears well.  Urinalysis consistent with UTI.  Will be started on Keflex twice daily.  Urine culture sent.  Will follow w/PCP in a week if her symptoms do not resolve.  We discussed return precautions   This patient presents to the ED for concern of weakness, this involves an extensive number of treatment options, and is a complaint that carries with it a high risk of complications and morbidity.  The differential diagnosis includes stroke, electrolyte imbalance, vertigo, intracranial hemorrhage, acute coronary syndrome   Lab Tests:   I  Ordered, reviewed, and interpreted labs, which included analysis, metabolic panel, complete blood count  Medicines ordered:   I ordered medication ativan PRN  For anxiety   Imaging Studies ordered:   I ordered imaging studies which included CT head   I independently visualized and interpreted imaging which showed no acute findings  Additional history obtained:    Previous records obtained and reviewed   Consultations Obtained:   I consulted neurology  and discussed lab and imaging findings  Reevaluation:  After the interventions stated above, I reevaluated the patient and found patient feels much improved  Critical Interventions:  . None     Final Clinical Impression(s) / ED Diagnoses Final diagnoses:  Dizziness  Acute cystitis without hematuria    Rx / DC Orders ED Discharge Orders         Ordered    cephALEXin (KEFLEX) 500 MG capsule  2 times daily     04/22/20 [id number]           Ripley Fraise, MD 04/22/20 0700

## 2020-04-22 NOTE — Discharge Instructions (Addendum)

## 2020-04-22 NOTE — ED Notes (Signed)
Patient verbalizes understanding of discharge instructions. Opportunity for questioning and answers were provided. Armband removed by staff, pt discharged from ED.  

## 2020-04-23 LAB — URINE CULTURE

## 2020-05-07 ENCOUNTER — Ambulatory Visit: Payer: Medicare HMO | Admitting: Family Medicine

## 2020-05-14 ENCOUNTER — Encounter: Payer: Self-pay | Admitting: Rheumatology

## 2020-05-14 ENCOUNTER — Ambulatory Visit (INDEPENDENT_AMBULATORY_CARE_PROVIDER_SITE_OTHER): Payer: Medicare HMO | Admitting: Rheumatology

## 2020-05-14 ENCOUNTER — Other Ambulatory Visit: Payer: Self-pay

## 2020-05-14 VITALS — BP 147/67 | HR 63 | Resp 16 | Ht 65.0 in | Wt 197.0 lb

## 2020-05-14 DIAGNOSIS — M170 Bilateral primary osteoarthritis of knee: Secondary | ICD-10-CM

## 2020-05-14 DIAGNOSIS — R768 Other specified abnormal immunological findings in serum: Secondary | ICD-10-CM

## 2020-05-14 DIAGNOSIS — F5101 Primary insomnia: Secondary | ICD-10-CM

## 2020-05-14 DIAGNOSIS — Z8639 Personal history of other endocrine, nutritional and metabolic disease: Secondary | ICD-10-CM | POA: Diagnosis not present

## 2020-05-14 DIAGNOSIS — M19041 Primary osteoarthritis, right hand: Secondary | ICD-10-CM | POA: Diagnosis not present

## 2020-05-14 DIAGNOSIS — R69 Illness, unspecified: Secondary | ICD-10-CM | POA: Diagnosis not present

## 2020-05-14 DIAGNOSIS — Z86010 Personal history of colonic polyps: Secondary | ICD-10-CM

## 2020-05-14 DIAGNOSIS — M19071 Primary osteoarthritis, right ankle and foot: Secondary | ICD-10-CM | POA: Diagnosis not present

## 2020-05-14 DIAGNOSIS — Z8679 Personal history of other diseases of the circulatory system: Secondary | ICD-10-CM | POA: Diagnosis not present

## 2020-05-14 DIAGNOSIS — M180 Bilateral primary osteoarthritis of first carpometacarpal joints: Secondary | ICD-10-CM | POA: Diagnosis not present

## 2020-05-14 DIAGNOSIS — Z8659 Personal history of other mental and behavioral disorders: Secondary | ICD-10-CM

## 2020-05-14 DIAGNOSIS — Z860101 Personal history of adenomatous and serrated colon polyps: Secondary | ICD-10-CM

## 2020-05-14 DIAGNOSIS — M19072 Primary osteoarthritis, left ankle and foot: Secondary | ICD-10-CM

## 2020-05-14 DIAGNOSIS — Z13820 Encounter for screening for osteoporosis: Secondary | ICD-10-CM

## 2020-05-14 DIAGNOSIS — M19042 Primary osteoarthritis, left hand: Secondary | ICD-10-CM

## 2020-05-14 DIAGNOSIS — Z8719 Personal history of other diseases of the digestive system: Secondary | ICD-10-CM | POA: Diagnosis not present

## 2020-05-14 MED ORDER — TRIAMCINOLONE ACETONIDE 40 MG/ML IJ SUSP
10.0000 mg | INTRAMUSCULAR | Status: AC | PRN
  Administered 2020-05-14: 10 mg via INTRA_ARTICULAR

## 2020-05-14 MED ORDER — LIDOCAINE HCL 1 % IJ SOLN
0.5000 mL | INTRAMUSCULAR | Status: AC | PRN
  Administered 2020-05-14: .5 mL

## 2020-05-14 NOTE — Progress Notes (Signed)
Office Visit Note  Patient: [name]             Date of Birth: 02/01/1941           MRN: [id number]             PCP: Marin Olp, MD Referring: Marin Olp, MD Visit Date: 05/14/2020 Occupation: @GUAROCC @  Subjective:  Arthritis (Wants bil thumb inj)   History of Present Illness: [name] is a 79 y.o. female with osteoarthritis.  She continues to have pain and discomfort in her bilateral CMC joints.  She states she has been using [employer] braces told she has been having lot of discomfort.  She wants to have cortisone injection to her bilateral CMC's.  She has some discomfort in her knee joint and her feet which is tolerable.  She has been going to the gym on a regular basis and has been exercising almost 2 hours a day.  Activities of Daily Living:  Patient reports morning stiffness for 10 minutes.   Patient Denies nocturnal pain.  Difficulty dressing/grooming: Denies Difficulty climbing stairs: Denies Difficulty getting out of chair: Denies Difficulty using hands for taps, buttons, cutlery, and/or writing: Denies  Review of Systems  Constitutional: Positive for fatigue. Negative for night sweats, weight gain and weight loss.  HENT: Negative for mouth sores, trouble swallowing, trouble swallowing, mouth dryness and nose dryness.   Eyes: Positive for dryness. Negative for pain, redness and visual disturbance.  Respiratory: Negative for cough, shortness of breath and difficulty breathing.   Cardiovascular: Negative for chest pain, palpitations, hypertension, irregular heartbeat and swelling in legs/feet.  Gastrointestinal: Negative for blood in stool, constipation and diarrhea.  Endocrine: Negative for increased urination.  Genitourinary: Negative for vaginal dryness.  Musculoskeletal: Positive for arthralgias, joint pain and morning stiffness. Negative for joint swelling, myalgias, muscle weakness, muscle tenderness and myalgias.  Skin: Negative for color  change, rash, hair loss, skin tightness, ulcers and sensitivity to sunlight.  Allergic/Immunologic: Negative for susceptible to infections.  Neurological: Negative for dizziness, memory loss, night sweats and weakness.  Hematological: Negative for swollen glands.  Psychiatric/Behavioral: Positive for sleep disturbance. Negative for depressed mood. The patient is not nervous/anxious.     PMFS History:  Patient Active Problem List   Diagnosis Date Noted  . 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 Trigger finger, right middle finger 09/05/2017  . History of adenomatous polyp of colon 08/01/2017  . Primary osteoarthritis of both knees 03/10/2017  . Primary osteoarthritis of both hands 03/09/2017  . Primary osteoarthritis of both feet 03/09/2017  . Rheumatoid factor positive 03/09/2017  . Advanced directives, counseling/discussion 01/26/2017  . nonobstructive CAD (coronary artery disease) 12/01/2015  . 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 Insomnia 12/01/2015  . Glaucoma 12/01/2015  . Memory loss 12/01/2015  . Hammer toe   . Arthritis 08/29/2012  . GERD 09/06/2009  . Edema 09/06/2009  . History of syncope 09/06/2009  . Hyperlipidemia 05/19/2007  . 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 Essential hypertension 05/19/2007  . Hyperglycemia 05/19/2007    Past Medical History:  Diagnosis Date  . Anginal pain ([place]) 08/29/2012   "have had problems w/this"  . Anxiety 08/29/2012  . Arthritis 08/29/2012   right hand, Seen by Dr. Estanislado Pandy  . CAD (coronary artery disease)    nonobstructive  . Depression   . Exertional dyspnea 08/29/2012  . GERD (gastroesophageal reflux disease)   . Hammer toe    surgery x2, second  with Dr. Sharol Given- only other option amputatoin  . HLD (hyperlipidemia)   . HTN (hypertension)   . PNEUMONIA 07/04/2007   Qualifier: Diagnosis of  By: Larose Kells MD, [place] Syncope and collapse 08/29/2012   "first time ever"    Family History  Problem Relation Age of  Onset  . Heart disease Mother        brother, father  . Diabetes Mother        sister, brother  . Suicidality Father        51  . Coronary artery disease Brother   . Colon cancer Brother   . Stroke Brother   . Alcoholism Brother   . Ovarian cancer Daughter   . Esophageal cancer Neg Hx   . Rectal cancer Neg Hx   . Stomach cancer Neg Hx   . Liver cancer Neg Hx    Past Surgical History:  Procedure Laterality Date  . ABDOMINAL HYSTERECTOMY  1970's  . APPENDECTOMY  ~ 1970  . CATARACT EXTRACTION W/ INTRAOCULAR LENS  IMPLANT, BILATERAL  ~ 04/09/2009   bilateral  . TONSILLECTOMY AND ADENOIDECTOMY  1950's  . VEIN LIGATION AND STRIPPING  10-Apr-2011   bilaterally   Social History   Social History Narrative   Widowed 2013-11-09. Daughter died 2012/04/09. 2 living children. 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 Influenza, High Dose Seasonal PF 08/24/2016, 09/28/2018  . Influenza-Unspecified 11/12/2015, 09/25/2019  . PFIZER SARS-COV-2 Vaccination 01/21/2020, 02/11/2020  . Pneumococcal Conjugate-13 08/24/2016  . Pneumococcal Polysaccharide-23 12/29/1999  . Pneumococcal-Unspecified 08/27/2010  . Td 12/28/2005, 10/26/2016     Objective: Vital Signs: BP (!) 147/67 (BP Location: Left Arm, Patient Position: Sitting, Cuff Size: Normal)   Pulse 63   Resp 16   Ht 5\' 5"  (1.651 m)   Wt 197 lb (89.4 kg)   BMI 32.78 kg/m    Physical Exam Vitals and nursing note reviewed.  Constitutional:      Appearance: She is well-developed.  HENT:     Head: Normocephalic and atraumatic.  Eyes:     Conjunctiva/sclera: Conjunctivae normal.  Cardiovascular:     Rate and Rhythm: Normal rate and regular rhythm.     Heart sounds: Normal heart sounds.  Pulmonary:       Effort: Pulmonary effort is normal.     Breath sounds: Normal breath sounds.  Abdominal:     General: Bowel sounds are normal.     Palpations: Abdomen is soft.  Musculoskeletal:     Cervical back: Normal range of motion.  Lymphadenopathy:     Cervical: No cervical adenopathy.  Skin:    General: Skin is warm and dry.     Capillary Refill: Capillary refill takes less than 2 seconds.  Neurological:     Mental Status: She is alert and oriented to person, place, and time.  Psychiatric:        Behavior: Behavior normal.      Musculoskeletal Exam: C-spine was in good range of motion.  Shoulder joints, elbow joints, wrist joints were in good range of motion.  She has bilateral CMC tenderness.  She has PIP and DIP thickening with subluxation of some of the joints.  Hip joints, knee joints, ankles, MTPs and PIPs with good range of motion with no synovitis.  CDAI Exam: CDAI Score: -- Patient Global: --; Provider Global: -- Swollen: --; Tender: -- Joint Exam 05/14/2020   No joint exam has been documented for this visit   There is currently no information documented on the homunculus. Go to the Rheumatology activity and complete the homunculus joint exam.  Investigation: No additional findings.  Imaging: CT HEAD WO CONTRAST  Result Date: 04/21/2020 CLINICAL DATA:  Neuro deficit, acute, stroke suspected Possible stroke Patient reports bilateral leg weakness, right greater than left. EXAM: CT HEAD WITHOUT CONTRAST TECHNIQUE: Contiguous axial images were obtained from the base of the skull through the vertex without intravenous contrast. COMPARISON:  Head CT 08/29/2012 FINDINGS: Brain: No intracranial hemorrhage, mass effect, or midline shift. Age related atrophy. No hydrocephalus. The basilar cisterns are patent. Mild periventricular and deep white matter hypodensity most consistent with chronic small vessel ischemia. Bilateral basal gangliar mineralization is likely senescent unchanged.  No evidence of territorial infarct or acute ischemia. No extra-axial or intracranial fluid collection. Vascular: No hyperdense vessel or unexpected calcification. Skull: No fracture or focal lesion. Sinuses/Orbits: No acute findings. Minor mucosal thickening of ethmoid air cells. Mastoid air cells are clear. Bilateral cataract resection. Other: None. IMPRESSION: 1. No acute intracranial abnormality. 2. Age related atrophy and chronic small vessel ischemia. Electronically Signed   By: Keith Rake M.D.   On: 04/21/2020 15:09   MR BRAIN WO CONTRAST  Result Date: 04/22/2020 CLINICAL DATA:  Ataxia with stroke suspected EXAM: MRI HEAD WITHOUT CONTRAST TECHNIQUE: Multiplanar, multiecho pulse sequences of the brain and surrounding structures were obtained without intravenous contrast. COMPARISON:  Head CT from yesterday FINDINGS: Brain: No acute infarction, hemorrhage, hydrocephalus, extra-axial collection or mass lesion. Focus of mineralization/remote hemorrhage in the right posterior cerebrum, nonspecific in isolation. Mild chronic small vessel ischemic change in the deep cerebral white matter. Normal brain volume Vascular: Normal flow voids. Skull and upper cervical spine: Normal marrow signal. There is cervical degenerative facet spurring with C2-3 ankylosis. Sinuses/Orbits: Bilateral cataract resection. IMPRESSION: No acute finding, including infarct. Electronically Signed   By: Monte Fantasia M.D.   On: 04/22/2020 06:28    Recent Labs: Lab Results  Component Value Date   WBC 6.0 04/21/2020   HGB 13.3 04/21/2020   PLT 248 04/21/2020   NA 141 04/21/2020   K 4.0 04/21/2020   CL 105 04/21/2020   CO2 27 04/21/2020   GLUCOSE 100 (H) 04/21/2020   BUN 17 04/21/2020   CREATININE 0.69 04/21/2020   BILITOT 1.2 04/21/2020   ALKPHOS 64 04/21/2020   AST 28 04/21/2020   ALT 20 04/21/2020   PROT 6.7 04/21/2020   ALBUMIN 4.0 04/21/2020   CALCIUM 9.2 04/21/2020   GFRAA >60 04/21/2020    Speciality  Comments: No specialty comments available.  Procedures:  Small Joint Inj: bilateral thumb CMC on 05/14/2020 2:10 PM Indications: pain Details: 27 G needle, ultrasound-guided radial approach  Spinal Needle: No  Medications (Right): 0.5 mL lidocaine 1 %; 10 mg triamcinolone acetonide 40 MG/ML Medications (Left): 0.5 mL lidocaine 1 %; 10 mg triamcinolone acetonide 40 MG/ML Outcome: tolerated well, no immediate complications Procedure, treatment alternatives, risks and benefits explained, specific risks discussed. Consent was given by the patient. Immediately prior to procedure a time out was called to verify the correct patient, procedure, equipment, support staff and site/side marked as required. Patient was prepped and draped in the usual sterile fashion.  Allergies: Clindamycin/lincomycin and Trazodone and nefazodone   Assessment / Plan:     Visit Diagnoses: Arthritis of carpometacarpal (CMC) joint of both thumbs-she has been experiencing pain and discomfort in her bilateral CMC joints.  She states she has nocturnal pain at times.  She has been using a CMC brace.  Per her request bilateral CMC joints.  Injected with cortisone as described above.  She tolerated the procedure well.  Postprocedure instructions were given.  Primary osteoarthritis of both hands-joint protection muscle strengthening was discussed.  Primary osteoarthritis of both knees-she has been going to the gym on a regular basis and exercising.  Primary osteoarthritis of both feet-she is currently not having much discomfort.  Rheumatoid factor positive-she has no synovitis on my examination.  Other medical problems are listed as follows:  Primary insomnia  History of hypertension  History of hyperlipidemia  History of coronary artery disease  History of gastroesophageal reflux (GERD)  History of adenomatous polyp of colon  History of depression  Screening for osteoporosis  Orders: Orders Placed This  Encounter  Procedures  . Small Joint Inj: bilateral thumb CMC   No orders of the defined types were placed in this encounter.   .  Follow-Up Instructions: Return in about 6 months (around 11/14/2020) for Osteoarthritis.   Bo Merino, MD  Note - This record has been created using Editor, commissioning.  Chart creation errors have been sought, but may not always  have been located. Such creation errors do not reflect on  the standard of medical care.

## 2020-05-15 ENCOUNTER — Other Ambulatory Visit: Payer: Self-pay | Admitting: Family Medicine

## 2020-05-18 ENCOUNTER — Other Ambulatory Visit: Payer: Self-pay | Admitting: Family Medicine

## 2020-05-21 NOTE — Progress Notes (Signed)
Phone [phone] In person visit   Subjective:   [name] is a 79 y.o. year old very pleasant female patient who presents for/with See problem oriented charting Chief Complaint  Patient presents with  . Follow-up  . Hypertension  . Hyperlipidemia   This visit occurred during the SARS-CoV-2 public health emergency.  Safety protocols were in place, including screening questions prior to the visit, additional usage of staff PPE, and extensive cleaning of exam room while observing appropriate contact time as indicated for disinfecting solutions.   Past Medical History-  Patient Active Problem List   Diagnosis Date Noted  . Advanced directives, counseling/discussion 01/26/2017    Priority: High  . nonobstructive CAD (coronary artery disease) 12/01/2015    Priority: High  . Memory loss 12/01/2015    Priority: High  . Itchy skin 05/30/2020    Priority: Medium  . History of adenomatous polyp of colon 08/01/2017    Priority: Medium  . Varicose veins of both lower extremities 12/01/2015    Priority: Medium  . Hyperlipidemia 05/19/2007    Priority: Medium  . 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 Essential hypertension 05/19/2007    Priority: Medium  . Insomnia 12/01/2015    Priority: Low  . Glaucoma 12/01/2015    Priority: Low  . Hammer toe     Priority: Low  . Arthritis 08/29/2012    Priority: Low  . GERD 09/06/2009    Priority: Low  . Edema 09/06/2009    Priority: Low  . History of syncope 09/06/2009    Priority: Low  . Hyperglycemia 05/19/2007    Priority: Low  . 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 Trigger finger, right middle finger 09/05/2017  . Primary osteoarthritis of both knees 03/10/2017  . Primary osteoarthritis of both hands 03/09/2017  . Primary osteoarthritis of both feet 03/09/2017  . Rheumatoid factor positive 03/09/2017    Medications- reviewed and updated Current Outpatient Medications  Medication Sig  Dispense Refill  . amLODipine (NORVASC) 2.5 MG tablet TAKE ONE TABLET BY MOUTH ONE TIME DAILY  90 tablet 0  . aspirin 81 MG tablet Take 81 mg by mouth daily.     Marland Kitchen atorvastatin (LIPITOR) 80 MG tablet Take 1 tablet (80 mg total) by mouth every evening. 90 tablet 3  . diclofenac sodium (VOLTAREN) 1 % GEL Apply 3 gm to 3 large joints up to 3 times a day.Dispense 3 tubes with 3 refills. 3 Tube 3  . escitalopram (LEXAPRO) 20 MG tablet Take 1 tablet (20 mg total) by mouth daily. 90 tablet 3  . omeprazole (PRILOSEC) 20 MG capsule Take 1 capsule (20 mg total) by mouth 2 (two) times daily. 180 capsule 3  . triamcinolone cream (KENALOG) 0.1 % APPLY TO AFFECTED AREA TOPICALLY FOR A MAX OF 7-10 DAYS 160 g 1   No current facility-administered medications for this visit.     Objective:  BP 130/80   Pulse (!) 55   Temp 98.3 F (36.8 C)   Ht 5\' 5"  (1.651 m)   Wt 195 lb 9.6 oz (88.7 kg)   SpO2 97%   BMI 32.55 kg/m  Gen: NAD, resting comfortably CV: RRR no murmurs rubs or gallops Lungs: CTAB no crackles, wheeze, rhonchi Abdomen: soft/nontender/nondistended/normal bowel sounds.  Ext: no edema Skin: warm, dry  Results for orders placed or performed in visit on 05/30/20 (from the past 24 hour(s))  POCT Urinalysis Dipstick (Automated)     Status: Abnormal  Collection Time: 05/30/20 11:35 AM  Result Value Ref Range   Color, UA light yellow    Clarity, UA clear    Glucose, UA Negative Negative   Bilirubin, UA Negative    Ketones, UA Negative    Spec Grav, UA 1.020 1.010 - 1.025   Blood, UA Negative    pH, UA 6.5 5.0 - 8.0   Protein, UA Negative Negative   Urobilinogen, UA 0.2 0.2 or 1.0 E.U./dL   Nitrite, UA Negative    Leukocytes, UA Small (1+) (A) Negative       Assessment and Plan   #social update- sisters 80th birthday in [place]- and will see her 5 adoptive nieces and nephews as well. She is excited to travel.   #weakness- led to ED visit 04/22/20 but ultimately had reassuring  MRI. She states possibility of TIA was entertained or vertigo. Has had balance issues every since that time. She felt like visual field was more grayish. She also tended toward leaning off to the right and had to hold onto the bed. She does have ongoing balance issues. Offered PT for gait and balance training but she wants to continue to work with [employer]. Was treated for UTI at that time but ultimately just had multiple species present  #Concern for UTI S: Patients symptoms started 04/21/20.Treated for UTi back in april but ultimately did not have UTI Complains of dysuria: minimal; polyuria:q2-3hrs ; nocturia: 2-3 times per night; urgency: no.  Symptoms are pressure and frequent urination.  ROS- no fever, chills, nausea, vomiting, flank pain. No blood in urine.  A/P: UA with possible UTI. Will get culture given duration of symptoms and pretty stable. Empiric treatment with: will hold off for now Patient to follow up if new or worsening symptoms or failure to improve.   # nonobstructive CAD #hyperlipidemia S: Medication: Atorvastatin 80MG , aspirin 81 Lab Results  Component Value Date   CHOL 161 11/30/2019   HDL 66.40 11/30/2019   LDLCALC 79 11/30/2019   LDLDIRECT 79.0 08/01/2017   TRIG 79.0 11/30/2019   CHOLHDL 2 11/30/2019   A/P: Nonobstructive coronary artery disease-she denies chest pain or shortness of breath.  She is compliant with aspirin and statin and she should continue both of these.  If her LDL is above 70 we may add Zetia to be aggressive-particularly with the possible TIA she got with recently  # Depression S: Medication: Lexapro 20Mg . Ongoing sleep issues- doesn't likebeing alone Depression screen [facility] 2/9 05/30/2020 11/30/2019 07/06/2019  Decreased Interest 0 0 0  Down, Depressed, Hopeless 0 0 0  PHQ - 2 Score 0 0 0  Altered sleeping 3 3 0  Tired, decreased energy 2 0 0  Change in appetite 0 0 0  Feeling bad or failure about yourself  0 0 0  Trouble concentrating 0 0 0   Moving slowly or fidgety/restless 0 - 0  Suicidal thoughts 0 0 0  PHQ-9 Score 5 3 0  Difficult doing work/chores Not difficult at all Not difficult at all -  A/P: Reasonable control with no anhedonia or depressed mood would consider this full remission-continue Lexapro 20 mg  # GERD S:medication: Prilosec 20Mg . Rare breakthrough and uses pepcid B12 levels related to PPI use:  Lab Results  Component Value Date   VITAMINB12 261 11/30/2019  A/P: Reflux is reasonably well controlled but given some breakthrough continue PPI. Recheck b12 due to low normal level though she is on mv with b12  #hypertension S: medication: Amlodipine 2.5Mg   Home readings #s:  Does not check BP Readings from Last 3 Encounters:  05/30/20 130/80  05/14/20 (!) 147/67  04/22/20 (!) 161/71  A/P: good control today- continue current medicines  Recommended follow up: Return in about 6 months (around 11/29/2020) for physical or sooner if needed. Future Appointments  Date Time Provider [place]  11/12/2020  1:15 PM Bo Merino, MD [facility] None  12/05/2020 10:40 AM Yong Channel Brayton Mars, MD [id number]   Lab/Order associations:   ICD-10-CM   1. Essential hypertension  I10   2. Gastroesophageal reflux disease without esophagitis  K21.9   3. Pure hypercholesterolemia  E78.00 LDL cholesterol, direct  4. Major depressive disorder with single episode, in full remission ([place])  F32.5   5. Hyperglycemia  R73.9   6. Primary insomnia  F51.01   7. Urinary frequency  R35.0 POCT Urinalysis Dipstick (Automated)    Urine Culture  8. Itchy skin  L29.9   9. High risk medication use  Z79.899 Vitamin B12    Return precautions advised.  Garret Reddish, MD

## 2020-05-21 NOTE — Patient Instructions (Addendum)
Please stop by lab before you go If you have mychart- we will send your results within 3 business days of [place] receiving them.  If you do not have mychart- we will call you about results within 5 business days of [place] receiving them.   Great to see you today! Sorry for the scary incident in April but glad you have not had recurrence. We could do physical therapy referral for gait and balance training but you seem to be making progress with [facility] so we will hold off for now.   Hope you have a great [place] trip!

## 2020-05-30 ENCOUNTER — Other Ambulatory Visit: Payer: Self-pay

## 2020-05-30 ENCOUNTER — Ambulatory Visit: Payer: Medicare HMO | Admitting: Family Medicine

## 2020-05-30 ENCOUNTER — Ambulatory Visit (INDEPENDENT_AMBULATORY_CARE_PROVIDER_SITE_OTHER): Payer: Medicare HMO | Admitting: Family Medicine

## 2020-05-30 ENCOUNTER — Encounter: Payer: Self-pay | Admitting: Family Medicine

## 2020-05-30 ENCOUNTER — Ambulatory Visit: Payer: Medicare HMO | Admitting: Physician Assistant

## 2020-05-30 VITALS — BP 130/80 | HR 55 | Temp 98.3°F | Ht 65.0 in | Wt 195.6 lb

## 2020-05-30 DIAGNOSIS — K219 Gastro-esophageal reflux disease without esophagitis: Secondary | ICD-10-CM | POA: Diagnosis not present

## 2020-05-30 DIAGNOSIS — F325 Major depressive disorder, single episode, in full remission: Secondary | ICD-10-CM

## 2020-05-30 DIAGNOSIS — Z79899 Other long term (current) drug therapy: Secondary | ICD-10-CM

## 2020-05-30 DIAGNOSIS — I10 Essential (primary) hypertension: Secondary | ICD-10-CM

## 2020-05-30 DIAGNOSIS — E7800 Pure hypercholesterolemia, unspecified: Secondary | ICD-10-CM

## 2020-05-30 DIAGNOSIS — R350 Frequency of micturition: Secondary | ICD-10-CM | POA: Diagnosis not present

## 2020-05-30 DIAGNOSIS — R69 Illness, unspecified: Secondary | ICD-10-CM | POA: Diagnosis not present

## 2020-05-30 DIAGNOSIS — F5101 Primary insomnia: Secondary | ICD-10-CM

## 2020-05-30 DIAGNOSIS — L299 Pruritus, unspecified: Secondary | ICD-10-CM | POA: Diagnosis not present

## 2020-05-30 LAB — POC URINALSYSI DIPSTICK (AUTOMATED)
Bilirubin, UA: NEGATIVE
Blood, UA: NEGATIVE
Glucose, UA: NEGATIVE
Ketones, UA: NEGATIVE
Nitrite, UA: NEGATIVE
Protein, UA: NEGATIVE
Spec Grav, UA: 1.02 (ref 1.010–1.025)
Urobilinogen, UA: 0.2 E.U./dL
pH, UA: 6.5 (ref 5.0–8.0)

## 2020-05-30 LAB — LDL CHOLESTEROL, DIRECT: Direct LDL: 65 mg/dL

## 2020-05-30 LAB — VITAMIN B12: Vitamin B-12: 827 pg/mL (ref 211–911)

## 2020-05-31 ENCOUNTER — Other Ambulatory Visit: Payer: Self-pay | Admitting: Family Medicine

## 2020-05-31 LAB — URINE CULTURE
MICRO NUMBER:: 10554479
SPECIMEN QUALITY:: ADEQUATE

## 2020-06-03 ENCOUNTER — Other Ambulatory Visit: Payer: Self-pay | Admitting: Family Medicine

## 2020-08-13 ENCOUNTER — Other Ambulatory Visit: Payer: Self-pay | Admitting: Family Medicine

## 2020-08-16 ENCOUNTER — Other Ambulatory Visit: Payer: Self-pay | Admitting: Family Medicine

## 2020-08-17 MED ORDER — OMEPRAZOLE 20 MG PO CPDR: 20.0000 mg | DELAYED_RELEASE_CAPSULE | Freq: Two times a day (BID) | ORAL | 0 refills | Status: DC

## 2020-08-19 ENCOUNTER — Ambulatory Visit: Payer: Medicare HMO | Attending: Internal Medicine

## 2020-08-19 DIAGNOSIS — Z23 Encounter for immunization: Secondary | ICD-10-CM

## 2020-08-19 NOTE — Progress Notes (Signed)
   Covid-19 Vaccination Clinic  Name:  AVELEEN NEVERS    MRN: [id number] DOB: 14-Oct-1941  08/19/2020  Ms. Regan was observed post Covid-19 immunization for 30 minutes based on pre-vaccination screening without incident. She was provided with Vaccine Information Sheet and instruction to access the V-Safe system.   Ms. Cohea was instructed to call 911 with any severe reactions post vaccine: Marland Kitchen Difficulty breathing  . Swelling of face and throat  . A fast heartbeat  . A bad rash all over body  . Dizziness and weakness

## 2020-08-25 ENCOUNTER — Other Ambulatory Visit: Payer: Self-pay

## 2020-08-25 ENCOUNTER — Encounter: Payer: Self-pay | Admitting: Family Medicine

## 2020-08-25 ENCOUNTER — Ambulatory Visit (INDEPENDENT_AMBULATORY_CARE_PROVIDER_SITE_OTHER): Payer: Medicare HMO | Admitting: Family Medicine

## 2020-08-25 VITALS — BP 140/80 | HR 68 | Temp 98.4°F | Ht 65.0 in | Wt 199.0 lb

## 2020-08-25 DIAGNOSIS — I10 Essential (primary) hypertension: Secondary | ICD-10-CM | POA: Diagnosis not present

## 2020-08-25 DIAGNOSIS — M79675 Pain in left toe(s): Secondary | ICD-10-CM

## 2020-08-25 MED ORDER — METOPROLOL SUCCINATE ER 25 MG PO TB24: 25.0000 mg | ORAL_TABLET | Freq: Every day | ORAL | 3 refills | Status: DC

## 2020-08-25 MED ORDER — AMLODIPINE BESYLATE 2.5 MG PO TABS: 2.5000 mg | ORAL_TABLET | Freq: Every day | ORAL | 3 refills | Status: DC

## 2020-08-25 NOTE — Progress Notes (Signed)
Phone [phone] In person visit   Subjective:   [name] is a 79 y.o. year old very pleasant female patient who presents for/with See problem oriented charting Chief Complaint  Patient presents with  . Toe Injury    left big toe x 1 day    This visit occurred during the SARS-CoV-2 public health emergency.  Safety protocols were in place, including screening questions prior to the visit, additional usage of staff PPE, and extensive cleaning of exam room while observing appropriate contact time as indicated for disinfecting solutions.   Past Medical History-  Patient Active Problem List   Diagnosis Date Noted  . Advanced directives, counseling/discussion 01/26/2017    Priority: High  . nonobstructive CAD (coronary artery disease) 12/01/2015    Priority: High  . Memory loss 12/01/2015    Priority: High  . Itchy skin 05/30/2020    Priority: Medium  . History of adenomatous polyp of colon 08/01/2017    Priority: Medium  . Varicose veins of both lower extremities 12/01/2015    Priority: Medium  . Hyperlipidemia 05/19/2007    Priority: Medium  . 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 Essential hypertension 05/19/2007    Priority: Medium  . Insomnia 12/01/2015    Priority: Low  . Glaucoma 12/01/2015    Priority: Low  . Hammer toe     Priority: Low  . Arthritis 08/29/2012    Priority: Low  . GERD 09/06/2009    Priority: Low  . Edema 09/06/2009    Priority: Low  . History of syncope 09/06/2009    Priority: Low  . Hyperglycemia 05/19/2007    Priority: Low  . 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 Trigger finger, right middle finger 09/05/2017  . Primary osteoarthritis of both knees 03/10/2017  . Primary osteoarthritis of both hands 03/09/2017  . Primary osteoarthritis of both feet 03/09/2017  . Rheumatoid factor positive 03/09/2017    Medications- reviewed and updated Current Outpatient Medications  Medication Sig Dispense Refill    . amLODipine (NORVASC) 2.5 MG tablet Take 1 tablet (2.5 mg total) by mouth daily. 90 tablet 3  . aspirin 81 MG tablet Take 81 mg by mouth daily.     Marland Kitchen atorvastatin (LIPITOR) 80 MG tablet TAKE ONE TABLET BY MOUTH DAILY IN THE EVENING 90 tablet 0  . diclofenac sodium (VOLTAREN) 1 % GEL Apply 3 gm to 3 large joints up to 3 times a day.Dispense 3 tubes with 3 refills. 3 Tube 3  . escitalopram (LEXAPRO) 20 MG tablet Take 1 tablet (20 mg total) by mouth daily. 90 tablet 3  . omeprazole (PRILOSEC) 20 MG capsule Take 1 capsule (20 mg total) by mouth 2 (two) times daily. 180 capsule 0  . triamcinolone cream (KENALOG) 0.1 % APPLY TO AFFECTED AREA TOPICALLY FOR A MAX OF 7-10 DAYS 160 g 1  . metoprolol succinate (TOPROL-XL) 25 MG 24 hr tablet Take 1 tablet (25 mg total) by mouth daily. 90 tablet 3   No current facility-administered medications for this visit.     Objective:  BP 140/80   Pulse 68   Temp 98.4 F (36.9 C) (Temporal)   Ht 5\' 5"  (1.651 m)   Wt 199 lb (90.3 kg)   SpO2 96%   BMI 33.12 kg/m  Gen: NAD, resting comfortably CV: RRR no murmurs rubs or gallops Lungs: CTAB no crackles, wheeze, rhonchi.  Ext: no edema Skin: warm, dry MSK: Left great toe very tender to palpation  over the lateral portion of the nailbed as well as with palpation of the nail itself-like using of blood noted.  No purulence noted.  Right second toe bends out laterally over the third toe     Assessment and Plan   #Left Big Toe Pain S: left great toe pain. Patient was working with a heavy cutting board and fell down onto her arms first then swung down and hit her great toenail. She had immediate pain and felt like the nail was pushed down in the nailbed. Has had bleeding most of the day.  Throbbing 6/10 pain even at rest.  A/P: Patient with left great toe pain after heavy cutting board landed on her toe.  I am worried about fracture.  Additionally since there has been bleeding issues she may need to have the  nail removed to rule out open fracture.  Urgent referral to podiatry was placed today. -recommend tylenol 650mg  every 6 hours for pain  Also getting get established with podiatry as she has a second toe on the right foot overlapping the 3rd toe. She would like podiatry opnion on this as well  #hypertension S: medication: amlodipine 2.5 mg, metoprolol 25 mg XR Home readings #s: does not check BP Readings from Last 3 Encounters:  08/25/20 140/80  05/30/20 130/80  05/14/20 (!) 147/67  A/P: mild poor control but in pain today with her toe- previously controlled- continue current medicine for now   Recommended follow up:  As needed for acute concerns Future Appointments  Date Time Provider [place]  11/12/2020  1:15 PM Bo Merino, MD [facility] None  12/05/2020 10:40 AM Marin Olp, MD [facility]    Lab/Order associations:   ICD-10-CM   1. Great toe pain, left  M79.675 Ambulatory referral to Podiatry  2. Essential hypertension  I10     Meds ordered this encounter  Medications  . amLODipine (NORVASC) 2.5 MG tablet    Sig: Take 1 tablet (2.5 mg total) by mouth daily.    Dispense:  90 tablet    Refill:  3  . metoprolol succinate (TOPROL-XL) 25 MG 24 hr tablet    Sig: Take 1 tablet (25 mg total) by mouth daily.    Dispense:  90 tablet    Refill:  3   Return precautions advised.  Garret Reddish, MD

## 2020-08-25 NOTE — Patient Instructions (Addendum)
Health Maintenance Due  Topic Date Due  . INFLUENZA VACCINE --  - will complete later in flu season (please let us know if you get this at another location so we can update your chart) . We should have vaccination here in 1-2 months - can call back for an appointment.   07/27/2020   Urgent referral to podiatry-sit tight in the room and will try to get this scheduled  -recommend tylenol 650mg  every 6 hours for pain  Team please rewrap her toe

## 2020-08-26 ENCOUNTER — Encounter: Payer: Self-pay | Admitting: Podiatrist

## 2020-08-26 ENCOUNTER — Ambulatory Visit (INDEPENDENT_AMBULATORY_CARE_PROVIDER_SITE_OTHER): Payer: Medicare HMO

## 2020-08-26 ENCOUNTER — Ambulatory Visit: Payer: Medicare HMO | Admitting: Podiatrist

## 2020-08-26 DIAGNOSIS — S9032XA Contusion of left foot, initial encounter: Secondary | ICD-10-CM | POA: Diagnosis not present

## 2020-08-26 DIAGNOSIS — S90212A Contusion of left great toe with damage to nail, initial encounter: Secondary | ICD-10-CM

## 2020-08-26 DIAGNOSIS — S90222A Contusion of left lesser toe(s) with damage to nail, initial encounter: Secondary | ICD-10-CM

## 2020-08-26 NOTE — Patient Instructions (Signed)

## 2020-08-28 ENCOUNTER — Telehealth: Payer: Self-pay | Admitting: Family Medicine

## 2020-08-28 MED ORDER — AMLODIPINE BESYLATE 2.5 MG PO TABS: 2.5000 mg | ORAL_TABLET | Freq: Every day | ORAL | 3 refills | Status: DC

## 2020-08-28 MED ORDER — METOPROLOL SUCCINATE ER 25 MG PO TB24: 25.0000 mg | ORAL_TABLET | Freq: Every day | ORAL | 3 refills | Status: DC

## 2020-08-28 NOTE — Telephone Encounter (Signed)
MEDICATION: amLODipine (NORVASC) 2.5 MG tablet  metoprolol succinate (TOPROL-XL) 25 MG 24 hr tablet  PHARMACY:  [id number] - [place], [place] Phone:  [phone]  Fax:  [phone]        Comments: Patient said she called [facility] and they do not have the prescription.   **Let patient know to contact pharmacy at the end of the day to make sure medication is ready. **  ** Please notify patient to allow 48-72 hours to process**  **Encourage patient to contact the pharmacy for refills or they can request refills through [facility]**

## 2020-08-28 NOTE — Telephone Encounter (Signed)
Rx re sent  and pt notified.

## 2020-08-29 NOTE — Progress Notes (Signed)
Chief Complaint  Patient presents with  . Foot Injury    np// pt dropped a chopping block on L foot - req Dr. Annita Brod by Dr. Allen Norris [facility]     HPI: Patient is 79 y.o. female who presents today after dropping a thick cutting board/ butcher block on her left great toe.  She was making a salad for her daughters office and the cutting board fell on her- she was able to partially catch it but it still smashed her toe.  She also has a question about her right second toe that is elevated after a prior foot surgery- she would like to know if there are any options besides amputation.   Patient Active Problem List   Diagnosis Date Noted  . Itchy skin 05/30/2020  . 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 Trigger finger, right middle finger 09/05/2017  . History of adenomatous polyp of colon 08/01/2017  . Primary osteoarthritis of both knees 03/10/2017  . Primary osteoarthritis of both hands 03/09/2017  . Primary osteoarthritis of both feet 03/09/2017  . Rheumatoid factor positive 03/09/2017  . Advanced directives, counseling/discussion 01/26/2017  . nonobstructive CAD (coronary artery disease) 12/01/2015  . 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 Insomnia 12/01/2015  . Glaucoma 12/01/2015  . Memory loss 12/01/2015  . Hammer toe   . Arthritis 08/29/2012  . GERD 09/06/2009  . Edema 09/06/2009  . History of syncope 09/06/2009  . 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 Essential hypertension 05/19/2007  . Hyperglycemia 05/19/2007    Current Outpatient Medications on File Prior to Visit  Medication Sig Dispense Refill  . aspirin 81 MG tablet Take 81 mg by mouth daily.     Marland Kitchen atorvastatin (LIPITOR) 80 MG tablet TAKE ONE TABLET BY MOUTH DAILY IN THE EVENING 90 tablet 0  . diclofenac sodium (VOLTAREN) 1 % GEL Apply 3 gm to 3 large joints up to 3 times a day.Dispense 3 tubes with 3 refills. 3 Tube 3  . escitalopram (LEXAPRO) 20 MG tablet Take 1 tablet (20 mg  total) by mouth daily. 90 tablet 3  . omeprazole (PRILOSEC) 20 MG capsule Take 1 capsule (20 mg total) by mouth 2 (two) times daily. 180 capsule 0  . triamcinolone cream (KENALOG) 0.1 % APPLY TO AFFECTED AREA TOPICALLY FOR A MAX OF 7-10 DAYS 160 g 1   No current facility-administered medications on file prior to visit.    Allergies  Allergen Reactions  . Clindamycin/Lincomycin     Severe rash and into throat  . Trazodone And Nefazodone Other (See Comments)    drowsiness    Review of Systems No fevers, chills, nausea, muscle aches, no difficulty breathing, no calf pain, no chest pain or shortness of breath.   Physical Exam  GENERAL APPEARANCE: Alert, conversant. Appropriately groomed. No acute distress.   VASCULAR: Pedal pulses palpable DP and PT bilateral.  Capillary refill time is immediate to all digits,  Proximal to distal cooling it warm to warm.  Digital perfusion adequate.   NEUROLOGIC: sensation is intact to 5.07 monofilament at 5/5 sites bilateral.  Light touch is intact bilateral, vibratory sensation intact bilateral  MUSCULOSKELETAL: acceptable muscle strength, tone and stability bilateral.  Right second toe is aligned in a dorsalward direction and appears fused.  The dorsal skin incision appears to have contracted causing the toe to sit in a fixed upward position.  It is manually reducible.  It is also crowded between the first and third toes.  DERMATOLOGIC: skin is warm, supple, and dry.  No open lesions noted.  No rash, no pre ulcerative lesions. Left hallux nail has a visible hematoma beneath the nail with a purple discoloration and lifting of the nail off the nail bed on the lateral half of the nail.    Radiographic exam:  Normal osseous mineralization.  No fracture or dislocation or acute osseous abnormalities present.  Joint spaces are normal.  No obvious fracture noted at the hallux proximal or distal phalanx.    Assessment   Subungual hematoma of toe of left  foot, initial encounter  Contusion of left foot, initial encounter - Plan: DG Foot Complete Left    Plan  Treatment options and alternatives discussed.  Recommended a non permanent removal of the left hallux nail to address the hematoma and the damaged nail and patient agreed.  Left hallux was prepped with alcohol and a 1 to 1 mix of 0.5% marcaine plain and 2% lidocaine plain was administered in a digital block fashion.  A betadine  prep was perform and digit exsanguinated.  The entire nail  was then carefully removed with a freer elevator and all necrotic tissue was resected.  The area was then cleansed well and antibiotic ointment and a dry sterile dressing was applied.  The patient was dispensed instructions for aftercare.    We discussed the right second toe and that I felt there was a  Possibility of straightening out the toe with a skin plasty and either a fusion or possibly a lesser mpj implant.  Also discussed an amputation is a consideration since there isn't much room for the toe to sit if straightened and this would be a much easier and quicker healing time vs a reconstruction.  She will consider if she would like to discuss surgery further and will contact our office to set up an appointment with one of the surgeons to explore further possibilities.

## 2020-10-07 ENCOUNTER — Other Ambulatory Visit: Payer: Self-pay

## 2020-10-07 ENCOUNTER — Encounter: Payer: Self-pay | Admitting: Family Medicine

## 2020-10-07 ENCOUNTER — Ambulatory Visit (INDEPENDENT_AMBULATORY_CARE_PROVIDER_SITE_OTHER): Payer: Medicare HMO

## 2020-10-07 DIAGNOSIS — Z23 Encounter for immunization: Secondary | ICD-10-CM

## 2020-10-07 NOTE — Progress Notes (Signed)
Per orders of Dr Garret Reddish, injection of Influenza given by [facility], cma.  Patient tolerated injection well.

## 2020-10-07 NOTE — Telephone Encounter (Signed)
LVM for patient to call back to either schedule for a virtual today or in person visit for tomorrow.

## 2020-10-08 ENCOUNTER — Ambulatory Visit: Payer: Medicare HMO

## 2020-10-08 ENCOUNTER — Ambulatory Visit (INDEPENDENT_AMBULATORY_CARE_PROVIDER_SITE_OTHER): Payer: Medicare HMO | Admitting: Family Medicine

## 2020-10-08 ENCOUNTER — Encounter: Payer: Self-pay | Admitting: Family Medicine

## 2020-10-08 VITALS — BP 128/90 | HR 62 | Temp 97.9°F | Resp 18 | Ht 65.0 in | Wt 198.4 lb

## 2020-10-08 DIAGNOSIS — M545 Low back pain, unspecified: Secondary | ICD-10-CM

## 2020-10-08 DIAGNOSIS — I10 Essential (primary) hypertension: Secondary | ICD-10-CM

## 2020-10-08 MED ORDER — TRAMADOL HCL 50 MG PO TABS: 50.0000 mg | ORAL_TABLET | Freq: Four times a day (QID) | ORAL | 0 refills | Status: DC | PRN

## 2020-10-08 MED ORDER — TIZANIDINE HCL 2 MG PO CAPS: 2.0000 mg | ORAL_CAPSULE | Freq: Three times a day (TID) | ORAL | 0 refills | Status: DC | PRN

## 2020-10-08 NOTE — Patient Instructions (Addendum)
Low back strain  I am glad you are improving.  You still have a lot of muscle tension in the right low back.  Massage would likely be helpful.  I want you to start by trying tizanidine which is a muscle relaxant once you are home-do not drive for 8 hours after taking this.  Some people prefer to simply take this before bedtime as it also helps him sleep.  If this does not improve your pain enough perhaps at least 2 under 5 out of 10 and you can also use tramadol-be careful with this combination as they can make you drowsy or woozy-if you want to start with just 1/2 tablet that is reasonable.  Some people will get constipated with tramadol so please make sure to remain well-hydrated and perhaps have some MiraLAX around if needed.  I think you will be feeling better in a week or 2 but back strain like this can last up to 6 weeks.  If it last past 6 weeks or you have new or worsening symptoms please let us know-we may need to have you see sports medicine or orthopedics  Recommended follow up: Return in about 1 month (around 11/08/2020) for as needed for new, worsening, persistent symptoms.

## 2020-10-08 NOTE — Progress Notes (Signed)
Phone [phone] In person visit   Subjective:   [name] is a 79 y.o. year old very pleasant female patient who presents for/with See problem oriented charting Chief Complaint  Patient presents with  . Back Pain   This visit occurred during the SARS-CoV-2 public health emergency.  Safety protocols were in place, including screening questions prior to the visit, additional usage of staff PPE, and extensive cleaning of exam room while observing appropriate contact time as indicated for disinfecting solutions.   Past Medical History-  Patient Active Problem List   Diagnosis Date Noted  . Advanced directives, counseling/discussion 01/26/2017    Priority: High  . nonobstructive CAD (coronary artery disease) 12/01/2015    Priority: High  . Memory loss 12/01/2015    Priority: High  . Itchy skin 05/30/2020    Priority: Medium  . History of adenomatous polyp of colon 08/01/2017    Priority: Medium  . Varicose veins of both lower extremities 12/01/2015    Priority: Medium  . 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 Essential hypertension 05/19/2007    Priority: Medium  . Insomnia 12/01/2015    Priority: Low  . Glaucoma 12/01/2015    Priority: Low  . Hammer toe     Priority: Low  . Arthritis 08/29/2012    Priority: Low  . GERD 09/06/2009    Priority: Low  . Edema 09/06/2009    Priority: Low  . History of syncope 09/06/2009    Priority: Low  . Hyperglycemia 05/19/2007    Priority: Low  . 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 Trigger finger, right middle finger 09/05/2017  . Primary osteoarthritis of both knees 03/10/2017  . Primary osteoarthritis of both hands 03/09/2017  . Primary osteoarthritis of both feet 03/09/2017  . Rheumatoid factor positive 03/09/2017    Medications- reviewed and updated Current Outpatient Medications  Medication Sig Dispense Refill  . amLODipine (NORVASC) 2.5 MG tablet Take 1 tablet (2.5 mg total) by mouth  daily. 90 tablet 3  . aspirin 81 MG tablet Take 81 mg by mouth daily.     Marland Kitchen atorvastatin (LIPITOR) 80 MG tablet TAKE ONE TABLET BY MOUTH DAILY IN THE EVENING 90 tablet 0  . Cholecalciferol (VITAMIN D3) 50 MCG (2000 UT) TABS Take 1 tablet by mouth daily.    . diclofenac sodium (VOLTAREN) 1 % GEL Apply 3 gm to 3 large joints up to 3 times a day.Dispense 3 tubes with 3 refills. 3 Tube 3  . escitalopram (LEXAPRO) 20 MG tablet Take 1 tablet (20 mg total) by mouth daily. 90 tablet 3  . metoprolol succinate (TOPROL-XL) 25 MG 24 hr tablet Take 1 tablet (25 mg total) by mouth daily. 90 tablet 3  . omeprazole (PRILOSEC) 20 MG capsule Take 1 capsule (20 mg total) by mouth 2 (two) times daily. 180 capsule 0  . triamcinolone cream (KENALOG) 0.1 % APPLY TO AFFECTED AREA TOPICALLY FOR A MAX OF 7-10 DAYS 160 g 1  . vitamin B-12 (CYANOCOBALAMIN) 1000 MCG tablet Take 1,000 mcg by mouth daily.     No current facility-administered medications for this visit.     Objective:  BP 128/90   Pulse 62   Temp 97.9 F (36.6 C) (Temporal)   Resp 18   Ht 5\' 5"  (1.651 m)   Wt 198 lb 6.4 oz (90 kg)   SpO2 98%   BMI 33.02 kg/m  Gen: NAD, resting comfortably CV: RRR no murmurs rubs or gallops Lungs:  CTAB no crackles, wheeze, rhonchi Ext: no edema Skin: warm, dry Back - Normal skin, Spine with normal alignment and no deformity.  No tenderness to vertebral process palpation.  Paraspinous muscles are  tender and with spasm in right low back   Range of motion is full at neck and lumbar sacral regions. Negative Straight leg raise.  Neuro- no saddle anesthesia, 5/5 strength lower extremities     Assessment and Plan  # Right low Back Pain  S:Patient mentioned that she was picking up luggage on Sunday to weigh it since she is leaving for a trip on Saturday(10-11-2020 [place], [place] for sisters 80th surprise party). Picked up with 2 handles - did multiple times to check on the weight. Felt bent over in pain afterwards. She  believed she may have pulled a muscle with that but she stated that she has also been exercising as well. She has taken otc ibuprofen (diarrhea and upset stomach so stopped) and Lidocaine patch 8 hours and biofreeze afterwards.  50% better than it was on Monday. Doing some exercises trainer gave her and that's helping. Pain 6-7/10. t  Pain is worse when standing from seated- hard to arch herself back. Feels frozen if tries to pick something up or wiping. Pain across the low back. Good ROM in neck. Still able to go to [facility] most days.   Previous Imaging-MRI 01/11/2005 "IMPRESSION:  1. Minimal disc desiccation at L5-S1 without significant herniation or disc bulge at any level.   2. 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  " -she reports this was after tripping on a chair and falling down  ROS-No saddle anesthesia, bladder incontinence, fecal incontinence, weakness in extremity, numbness or tingling in extremity. History negative for trauma, history of cancer, fever, chills, unintentional weight loss, recent bacterial infection, recent IV drug use, HIV, pain worse at night or while supine.  A/P: 79 year old female with right low back strain/pain without sciatica.  Treatment as per after visit summary.   From AVS "  Patient Instructions  Low back strain  I am glad you are improving.  You still have a lot of muscle tension in the right low back.  Massage would likely be helpful.  I want you to start by trying tizanidine which is a muscle relaxant once you are home-do not drive for 8 hours after taking this.  Some people prefer to simply take this before bedtime as it also helps him sleep.  If this does not improve your pain enough perhaps at least 2 under 5 out of 10 and you can also use tramadol-be careful with this combination as they can make you drowsy or woozy-if you want to start with just 1/2 tablet that is reasonable.  Some people will get constipated with tramadol so please make sure to remain  well-hydrated and perhaps have some MiraLAX around if needed.  I think you will be feeling better in a week or 2 but back strain like this can last up to 6 weeks.  If it last past 6 weeks or you have new or worsening symptoms please let us know-we may need to have you see sports medicine or orthopedics  Recommended follow up: Return in about 1 month (around 11/08/2020) for as needed for new, worsening, persistent symptoms.   "  #hypertension S: medication: amlodipine 2.5mg , metoprolol 25 mg XR BP Readings from Last 3 Encounters:  10/08/20 128/90  08/25/20 140/80  05/30/20 130/80  A/P: slight poor control- hair high today diastolic but likely related to  pain- will trend at next visit. Continue current meds   Recommended follow up: Return in about 1 month (around 11/08/2020) for as needed for new, worsening, persistent symptoms. keep December visit Future Appointments  Date Time Provider [place]  11/12/2020  1:15 PM Bo Merino, MD [facility] None  12/05/2020 10:40 AM Yong Channel Brayton Mars, MD [facility]   Lab/Order associations:   ICD-10-CM   1. Acute right-sided low back pain without sciatica  M54.50   2. Essential hypertension  I10     Meds ordered this encounter  Medications  . traMADol (ULTRAM) 50 MG tablet    Sig: Take 1 tablet (50 mg total) by mouth every 6 (six) hours as needed for moderate pain or severe pain (do not drive for 8 hours after taking).    Dispense:  20 tablet    Refill:  0  . tizanidine (ZANAFLEX) 2 MG capsule    Sig: Take 1 capsule (2 mg total) by mouth every 8 (eight) hours as needed for muscle spasms (do not drive for 8 hours after using).    Dispense:  20 capsule    Refill:  0   Return precautions advised.  Garret Reddish, MD

## 2020-10-29 NOTE — Progress Notes (Signed)
Office Visit Note  Patient: [name]             Date of Birth: [birth year]           MRN: [id number]             PCP: Marin Olp, MD Referring: Marin Olp, MD Visit Date: 11/12/2020 Occupation: @GUAROCC @  Subjective:  Pain in both thumb   History of Present Illness: [name] is a 79 y.o. female with history of osteoarthritis.  She states she has been having pain and discomfort in her bilateral thumb.  Which she describes over the bilateral CMC joints.  She requests getting a cortisone injection.  She continues to have some discomfort in her knee joints ankles and her feet.  She has noticed some fluid retention in her legs.  She denies any joint swelling.  Activities of Daily Living:  Patient reports morning stiffness for  none.   Patient Denies nocturnal pain.  Difficulty dressing/grooming: Denies Difficulty climbing stairs: Denies Difficulty getting out of chair: Denies Difficulty using hands for taps, buttons, cutlery, and/or writing: Reports  Review of Systems  Constitutional: Positive for fatigue.  HENT: Positive for mouth dryness.   Eyes: Negative for dryness.  Respiratory: Negative for shortness of breath.   Cardiovascular: Positive for swelling in legs/feet.  Gastrointestinal: Negative for constipation.  Endocrine: Positive for heat intolerance.  Genitourinary: Negative for difficulty urinating.  Musculoskeletal: Positive for arthralgias, joint pain and joint swelling.  Skin: Negative for rash.  Allergic/Immunologic: Negative for susceptible to infections.  Neurological: Negative for weakness.  Hematological: Positive for bruising/bleeding tendency.  Psychiatric/Behavioral: Positive for sleep disturbance.    PMFS History:  Patient Active Problem List   Diagnosis Date Noted  . Itchy skin 05/30/2020  . 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 Trigger finger, right middle finger 09/05/2017  . History of adenomatous polyp of colon 08/01/2017  .  Primary osteoarthritis of both knees 03/10/2017  . Primary osteoarthritis of both hands 03/09/2017  . Primary osteoarthritis of both feet 03/09/2017  . Rheumatoid factor positive 03/09/2017  . Advanced directives, counseling/discussion 01/26/2017  . nonobstructive CAD (coronary artery disease) 12/01/2015  . 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 Insomnia 12/01/2015  . Glaucoma 12/01/2015  . Memory loss 12/01/2015  . Hammer toe   . Arthritis 08/29/2012  . GERD 09/06/2009  . Edema 09/06/2009  . History of syncope 09/06/2009  . 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 Essential hypertension 05/19/2007  . Hyperglycemia 05/19/2007    Past Medical History:  Diagnosis Date  . Anginal pain ([place]) 08/29/2012   "have had problems w/this"  . Anxiety 08/29/2012  . Arthritis 08/29/2012   right hand, Seen by Dr. Estanislado Pandy  . CAD (coronary artery disease)    nonobstructive  . Depression   . Exertional dyspnea 08/29/2012  . GERD (gastroesophageal reflux disease)   . Hammer toe    surgery x2, second with Dr. Sharol Given- only other option amputatoin  . HLD (hyperlipidemia)   . HTN (hypertension)   . PNEUMONIA 07/04/2007   Qualifier: Diagnosis of  By: Larose Kells MD, [place] Syncope and collapse 08/29/2012   "first time ever"    Family History  Problem Relation Age of Onset  . Heart disease Mother        brother, father  . Diabetes Mother        sister, brother  . Suicidality Father  42  . Coronary artery disease Brother   . Colon cancer Brother   . Stroke Brother   . Alcoholism Brother   . Ovarian cancer Daughter   . Esophageal cancer Neg Hx   . Rectal cancer Neg Hx   . Stomach cancer Neg Hx   . Liver cancer Neg Hx    Past Surgical History:  Procedure Laterality Date  . ABDOMINAL HYSTERECTOMY  1970's  . APPENDECTOMY  ~ 1970  . CATARACT EXTRACTION W/ INTRAOCULAR LENS  IMPLANT, BILATERAL  ~ 03/27/2009   bilateral  . TONSILLECTOMY AND ADENOIDECTOMY  1950's  . VEIN  LIGATION AND STRIPPING  03-28-2011   bilaterally   Social History   Social History Narrative   Widowed 10-27-13. Daughter died 03/27/12. 2 living children. 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 Fluad Quad(high Dose 65+) 09/25/2019, 10/07/2020  . Influenza, High Dose Seasonal PF 08/24/2016, 09/28/2018  . Influenza-Unspecified 11/12/2015, 09/25/2019  . PFIZER SARS-COV-2 Vaccination 01/21/2020, 02/11/2020, 08/19/2020  . Pneumococcal Conjugate-13 08/24/2016  . Pneumococcal Polysaccharide-23 12/29/1999  . Pneumococcal-Unspecified 08/27/2010  . Td 12/28/2005, 10/26/2016     Objective: Vital Signs: BP 115/61 (BP Location: Left Arm, Patient Position: Sitting, Cuff Size: Normal)   Pulse (!) 45   Resp 16   Ht 5\' 2"  (1.575 m)   Wt 201 lb 3.2 oz (91.3 kg)   BMI 36.80 kg/m    Physical Exam Vitals and nursing note reviewed.  Constitutional:      Appearance: She is well-developed.  HENT:     Head: Normocephalic and atraumatic.  Eyes:     Conjunctiva/sclera: Conjunctivae normal.  Cardiovascular:     Rate and Rhythm: Normal rate and regular rhythm.     Heart sounds: Normal heart sounds.  Pulmonary:     Effort: Pulmonary effort is normal.     Breath sounds: Normal breath sounds.  Abdominal:     General: Bowel sounds are normal.     Palpations: Abdomen is soft.  Musculoskeletal:     Cervical back: Normal range of motion.  Lymphadenopathy:     Cervical: No cervical adenopathy.  Skin:    General: Skin is warm and dry.     Capillary Refill: Capillary refill takes less than 2 seconds.     Comments: Dermal atrophy was noted over the right [facility] joint from previous cortisone injection.  Neurological:     Mental Status: She is alert and  oriented to person, place, and time.  Psychiatric:        Behavior: Behavior normal.      Musculoskeletal Exam: C-spine was in good range of motion.  Shoulder joints, elbow joints, wrist joints with good range of motion.  She has bilateral PIP DIP and CMC thickening.  Hip joints and knee joints in good range of motion.  She had no tenderness over ankles or MTPs.  CDAI Exam: CDAI Score: -- Patient Global: --; Provider Global: -- Swollen: --; Tender: -- Joint Exam 11/12/2020   No joint exam has been documented for this visit   There is currently no information documented on the homunculus. Go to the Rheumatology activity and complete the homunculus joint exam.  Investigation: No additional findings.  Imaging: No results found.  Recent Labs: Lab Results  Component Value Date   WBC 6.0 04/21/2020   HGB 13.3 04/21/2020   PLT 248 04/21/2020   NA 141 04/21/2020   K 4.0 04/21/2020   CL 105 04/21/2020   CO2 27 04/21/2020   GLUCOSE 100 (H) 04/21/2020   BUN 17 04/21/2020   CREATININE 0.69 04/21/2020   BILITOT 1.2 04/21/2020   ALKPHOS 64 04/21/2020   AST 28 04/21/2020   ALT 20 04/21/2020   PROT 6.7 04/21/2020   ALBUMIN 4.0 04/21/2020   CALCIUM 9.2 04/21/2020   GFRAA >60 04/21/2020    Speciality Comments: No specialty comments available.  Procedures:  Small Joint Inj: bilateral thumb CMC on 11/12/2020 1:25 PM Indications: pain Details: 27 G needle, ultrasound-guided radial approach  Spinal Needle: No  Medications (Right): 0.5 mL lidocaine 1 %; 10 mg triamcinolone acetonide 40 MG/ML Medications (Left): 0.5 mL lidocaine 1 %; 10 mg triamcinolone acetonide 40 MG/ML Outcome: tolerated well, no immediate complications Procedure, treatment alternatives, risks and benefits explained, specific risks discussed. Consent was given by the patient. Immediately prior to procedure a time out was called to verify the correct patient, procedure, equipment, support staff and site/side  marked as required. Patient was prepped and draped in the usual sterile fashion.     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  She requests bilateral CMC joint injections.  She has some dermal atrophy over the right [facility] joint.  I did explain the risk of cortisone injection and dermal atrophy.  Patient wants to proceed with the injection.  She states it gives her 6 months of relief.  She has been using [place] braces as well.  Primary osteoarthritis of both hands - Plan: US Guided Needle Placement, US Guided Needle Placement. Using a sterile technique the bilateral CMC joint was injected with 27-gauge sterile needle under ultrasound guidance. Using a 8 MHz transducer  in the axial plane the bilateral CMC joint was visualized.  The needle was approached under ultrasound guidance and image confirmed the placement of needle in the [facility] joint.  Patient tolerated the procedure well.  Postprocedure instructions were given.  Primary osteoarthritis of both knees-she continues to have some discomfort.  Primary osteoarthritis of both feet-proper fitting shoes were discussed.  Rheumatoid factor positive-she has no synovitis on examination.  Primary insomnia  History of hypertension  History of coronary artery disease  History of hyperlipidemia  History of adenomatous polyp of colon  History of gastroesophageal reflux (GERD)  History of depression  Screening for osteoporosis  Chronic thumb pain, right - Plan: US Guided Needle Placement  Chronic pain of left thumb - Plan: US Guided Needle Placement  Orders: Orders Placed This Encounter  Procedures  . Small Joint Inj  . US Guided Needle Placement  . US Guided Needle Placement   No orders of the defined types were placed in this encounter.    Follow-Up Instructions: Return in  about 6 months (around 05/12/2021) for Osteoarthritis.   Bo Merino, MD  Note - This record has been created using Editor, commissioning.  Chart creation errors have been sought, but may not always  have been located. Such creation errors do not reflect on  the standard of medical care.

## 2020-11-12 ENCOUNTER — Other Ambulatory Visit: Payer: Self-pay | Admitting: Family Medicine

## 2020-11-12 ENCOUNTER — Ambulatory Visit: Payer: Medicare HMO | Admitting: Rheumatology

## 2020-11-12 ENCOUNTER — Ambulatory Visit: Payer: Self-pay

## 2020-11-12 ENCOUNTER — Other Ambulatory Visit: Payer: Self-pay

## 2020-11-12 ENCOUNTER — Encounter: Payer: Self-pay | Admitting: Rheumatology

## 2020-11-12 VITALS — BP 115/61 | HR 45 | Resp 16 | Ht 62.0 in | Wt 201.2 lb

## 2020-11-12 DIAGNOSIS — Z8639 Personal history of other endocrine, nutritional and metabolic disease: Secondary | ICD-10-CM | POA: Diagnosis not present

## 2020-11-12 DIAGNOSIS — M79645 Pain in left finger(s): Secondary | ICD-10-CM

## 2020-11-12 DIAGNOSIS — M170 Bilateral primary osteoarthritis of knee: Secondary | ICD-10-CM | POA: Diagnosis not present

## 2020-11-12 DIAGNOSIS — M79644 Pain in right finger(s): Secondary | ICD-10-CM | POA: Diagnosis not present

## 2020-11-12 DIAGNOSIS — M180 Bilateral primary osteoarthritis of first carpometacarpal joints: Secondary | ICD-10-CM | POA: Diagnosis not present

## 2020-11-12 DIAGNOSIS — Z13820 Encounter for screening for osteoporosis: Secondary | ICD-10-CM

## 2020-11-12 DIAGNOSIS — R69 Illness, unspecified: Secondary | ICD-10-CM | POA: Diagnosis not present

## 2020-11-12 DIAGNOSIS — M19071 Primary osteoarthritis, right ankle and foot: Secondary | ICD-10-CM

## 2020-11-12 DIAGNOSIS — G8929 Other chronic pain: Secondary | ICD-10-CM | POA: Diagnosis not present

## 2020-11-12 DIAGNOSIS — F5101 Primary insomnia: Secondary | ICD-10-CM

## 2020-11-12 DIAGNOSIS — M19041 Primary osteoarthritis, right hand: Secondary | ICD-10-CM

## 2020-11-12 DIAGNOSIS — Z86010 Personal history of colonic polyps: Secondary | ICD-10-CM

## 2020-11-12 DIAGNOSIS — R768 Other specified abnormal immunological findings in serum: Secondary | ICD-10-CM

## 2020-11-12 DIAGNOSIS — Z860101 Personal history of adenomatous and serrated colon polyps: Secondary | ICD-10-CM

## 2020-11-12 DIAGNOSIS — M19042 Primary osteoarthritis, left hand: Secondary | ICD-10-CM

## 2020-11-12 DIAGNOSIS — M19072 Primary osteoarthritis, left ankle and foot: Secondary | ICD-10-CM

## 2020-11-12 DIAGNOSIS — Z8659 Personal history of other mental and behavioral disorders: Secondary | ICD-10-CM

## 2020-11-12 DIAGNOSIS — Z8679 Personal history of other diseases of the circulatory system: Secondary | ICD-10-CM | POA: Diagnosis not present

## 2020-11-12 DIAGNOSIS — Z8719 Personal history of other diseases of the digestive system: Secondary | ICD-10-CM

## 2020-11-12 MED ORDER — TRIAMCINOLONE ACETONIDE 40 MG/ML IJ SUSP
10.0000 mg | INTRAMUSCULAR | Status: AC | PRN
Start: 2020-11-12 — End: 2020-11-12
  Administered 2020-11-12: 10 mg via INTRA_ARTICULAR

## 2020-11-12 MED ORDER — LIDOCAINE HCL 1 % IJ SOLN
0.5000 mL | INTRAMUSCULAR | Status: AC | PRN
  Administered 2020-11-12: .5 mL

## 2020-11-12 MED ORDER — TRIAMCINOLONE ACETONIDE 40 MG/ML IJ SUSP
10.0000 mg | INTRAMUSCULAR | Status: AC | PRN
  Administered 2020-11-12: 10 mg via INTRA_ARTICULAR

## 2020-12-04 ENCOUNTER — Telehealth (INDEPENDENT_AMBULATORY_CARE_PROVIDER_SITE_OTHER): Payer: Medicare HMO | Admitting: Family Medicine

## 2020-12-04 ENCOUNTER — Ambulatory Visit: Payer: Medicare HMO | Admitting: Family Medicine

## 2020-12-04 ENCOUNTER — Encounter: Payer: Self-pay | Admitting: Family Medicine

## 2020-12-04 DIAGNOSIS — H9201 Otalgia, right ear: Secondary | ICD-10-CM | POA: Diagnosis not present

## 2020-12-04 DIAGNOSIS — R0981 Nasal congestion: Secondary | ICD-10-CM

## 2020-12-04 DIAGNOSIS — J3489 Other specified disorders of nose and nasal sinuses: Secondary | ICD-10-CM | POA: Diagnosis not present

## 2020-12-04 MED ORDER — AMOXICILLIN-POT CLAVULANATE 875-125 MG PO TABS: 1.0000 | ORAL_TABLET | Freq: Two times a day (BID) | ORAL | 0 refills | Status: AC

## 2020-12-04 NOTE — Progress Notes (Signed)
Phone [phone] Virtual visit via Video note   Subjective:  Chief complaint: Chief Complaint  Patient presents with  . Sore Throat  . Ear Fullness   This visit type was conducted due to national recommendations for restrictions regarding the COVID-19 Pandemic (e.g. social distancing).  This format is felt to be most appropriate for this patient at this time balancing risks to patient and risks to population by having him in for in person visit.  No physical exam was performed (except for noted visual exam or audio findings with Telehealth visits).    Our team/I connected with [name] at  4:20 PM EST by a video enabled telemedicine application ([email or web address] or caregility through epic) and verified that I am speaking with the correct person using two identifiers.  Location patient: [place] Location provider: [facility], office Persons participating in the virtual visit:  patient  Our team/I discussed the limitations of evaluation and management by telemedicine and the availability of in person appointments. In light of current covid-19 pandemic, patient also understands that we are trying to protect them by minimizing in office contact if at all possible.  The patient expressed consent for telemedicine visit and agreed to proceed. Patient understands insurance will be billed.   Past Medical History-  Patient Active Problem List   Diagnosis Date Noted  . Advanced directives, counseling/discussion 01/26/2017    Priority: High  . nonobstructive CAD (coronary artery disease) 12/01/2015    Priority: High  . Memory loss 12/01/2015    Priority: High  . Itchy skin 05/30/2020    Priority: Medium  . History of adenomatous polyp of colon 08/01/2017    Priority: Medium  . Varicose veins of both lower extremities 12/01/2015    Priority: Medium  . 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[address]) 05/19/2007    Priority: Medium  . Essential hypertension 05/19/2007    Priority: Medium  .  Insomnia 12/01/2015    Priority: Low  . Glaucoma 12/01/2015    Priority: Low  . Hammer toe     Priority: Low  . Arthritis 08/29/2012    Priority: Low  . GERD 09/06/2009    Priority: Low  . Edema 09/06/2009    Priority: Low  . History of syncope 09/06/2009    Priority: Low  . Hyperglycemia 05/19/2007    Priority: Low  . 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 Trigger finger, right middle finger 09/05/2017  . Primary osteoarthritis of both knees 03/10/2017  . Primary osteoarthritis of both hands 03/09/2017  . Primary osteoarthritis of both feet 03/09/2017  . Rheumatoid factor positive 03/09/2017    Medications- reviewed and updated Current Outpatient Medications  Medication Sig Dispense Refill  . amLODipine (NORVASC) 2.5 MG tablet Take 1 tablet (2.5 mg total) by mouth daily. 90 tablet 3  . aspirin 81 MG tablet Take 81 mg by mouth daily.     Marland Kitchen atorvastatin (LIPITOR) 80 MG tablet TAKE ONE TABLET BY MOUTH DAILY IN THE EVENING 90 tablet 0  . Cholecalciferol (VITAMIN D3) 50 MCG (2000 UT) TABS Take 1 tablet by mouth daily.    . diclofenac sodium (VOLTAREN) 1 % GEL Apply 3 gm to 3 large joints up to 3 times a day.Dispense 3 tubes with 3 refills. 3 Tube 3  . escitalopram (LEXAPRO) 20 MG tablet Take 1 tablet (20 mg total) by mouth daily. 90 tablet 3  . metoprolol succinate (TOPROL-XL) 25 MG 24 hr tablet Take 1 tablet (25 mg total) by mouth daily. 90 tablet 3  .  omeprazole (PRILOSEC) 20 MG capsule Take 1 capsule (20 mg total) by mouth 2 (two) times daily. 180 capsule 0  . triamcinolone cream (KENALOG) 0.1 % APPLY TO AFFECTED AREA TOPICALLY FOR A MAX OF 7-10 DAYS 160 g 1  . vitamin B-12 (CYANOCOBALAMIN) 1000 MCG tablet Take 1,000 mcg by mouth daily.     No current facility-administered medications for this visit.     Objective:   self reported vitals 97 degrees most recently Gen: NAD, resting comfortably Patient points to right ear and frontal sinus as well as source of most discomfort Lungs:  nonlabored, normal respiratory rate  Skin: appears dry, no obvious rash     Assessment and Plan   Sore Throat/ Full Ears/sinus pressure S: Patient mentioned that her symptoms started this past Sunday. She had some sneezing and coughing which led to her ears hurt and some mild headache. She now has a sore throat. She has concerns that it may be her sinuses.  Has tried nasal saline spray and extra strength tylenol. This morning had temperature up to 101. Temp after tylenol went down to 97 and throat feels somewhat better. Right ear with worse pain than the left. A lot of frontal sinus pressure. Grayish discharge/drainage. Has not been exposed to anyone with strep throat or flu.   Got tested for covid 19 at [place] and got negative result back this morning after getting tested on Tuesday - PCR test. Cancelled Christmas party at her house as a result.  A/P: 79 year old female with 5 days of sinus pressure/sinus congestion/sore throat now with right ear pain.  This could potentially be a viral URI or viral sinusitis.  She is already tested negative for COVID-19 thankfully.  We discussed indications for treatment for bacterial sinusitis which would include symptoms over 10 days or double sickening.  We also discussed could have an ear infection.  I sent in Augmentin for her but we agreed she will only take this if symptoms worsen or fail to improve by day 10.  If she has new or worsening symptoms that do not respond to treatment she should let me know  Recommended follow up: Patient will call back to schedule physical which was originally scheduled for tomorrow Future Appointments  Date Time Provider [place]  05/13/2021  1:00 PM Bo Merino, MD [facility] None   Lab/Order associations:   ICD-10-CM   1. Sinus pressure  J34.89   2. Sinus congestion  R09.81   3. Right ear pain  H92.01     Meds ordered this encounter  Medications  . amoxicillin-clavulanate (AUGMENTIN) 875-125  MG tablet    Sig: Take 1 tablet by mouth 2 (two) times daily for 7 days.    Dispense:  14 tablet    Refill:  0   Return precautions advised.  Garret Reddish, MD

## 2020-12-04 NOTE — Patient Instructions (Signed)
Health Maintenance Due  Topic Date Due  . Hepatitis C Screening  Never done  . COLONOSCOPY  11/03/2020

## 2020-12-05 ENCOUNTER — Encounter: Payer: Medicare HMO | Admitting: Family Medicine

## 2020-12-12 ENCOUNTER — Ambulatory Visit: Payer: Medicare HMO

## 2020-12-25 ENCOUNTER — Other Ambulatory Visit: Payer: Self-pay | Admitting: Family Medicine

## 2021-01-23 ENCOUNTER — Other Ambulatory Visit: Payer: Self-pay | Admitting: Family Medicine

## 2021-02-10 ENCOUNTER — Other Ambulatory Visit: Payer: Self-pay | Admitting: Family Medicine

## 2021-02-10 ENCOUNTER — Encounter: Payer: Self-pay | Admitting: Gastroenterology

## 2021-02-16 ENCOUNTER — Other Ambulatory Visit: Payer: Self-pay | Admitting: Family Medicine

## 2021-02-16 DIAGNOSIS — Z1231 Encounter for screening mammogram for malignant neoplasm of breast: Secondary | ICD-10-CM

## 2021-02-23 ENCOUNTER — Encounter: Payer: Self-pay | Admitting: Family Medicine

## 2021-02-24 ENCOUNTER — Encounter: Payer: Self-pay | Admitting: Gastroenterology

## 2021-02-24 ENCOUNTER — Encounter: Payer: Self-pay | Admitting: Family Medicine

## 2021-02-25 ENCOUNTER — Other Ambulatory Visit: Payer: Self-pay

## 2021-02-25 DIAGNOSIS — L299 Pruritus, unspecified: Secondary | ICD-10-CM

## 2021-02-27 DIAGNOSIS — E785 Hyperlipidemia, unspecified: Secondary | ICD-10-CM | POA: Diagnosis not present

## 2021-02-27 DIAGNOSIS — K219 Gastro-esophageal reflux disease without esophagitis: Secondary | ICD-10-CM | POA: Diagnosis not present

## 2021-02-27 DIAGNOSIS — G8929 Other chronic pain: Secondary | ICD-10-CM | POA: Diagnosis not present

## 2021-02-27 DIAGNOSIS — Z008 Encounter for other general examination: Secondary | ICD-10-CM | POA: Diagnosis not present

## 2021-02-27 DIAGNOSIS — I10 Essential (primary) hypertension: Secondary | ICD-10-CM | POA: Diagnosis not present

## 2021-02-27 DIAGNOSIS — Z6834 Body mass index (BMI) 34.0-34.9, adult: Secondary | ICD-10-CM | POA: Diagnosis not present

## 2021-02-27 DIAGNOSIS — E669 Obesity, unspecified: Secondary | ICD-10-CM | POA: Diagnosis not present

## 2021-02-27 DIAGNOSIS — R69 Illness, unspecified: Secondary | ICD-10-CM | POA: Diagnosis not present

## 2021-02-27 DIAGNOSIS — M1990 Unspecified osteoarthritis, unspecified site: Secondary | ICD-10-CM | POA: Diagnosis not present

## 2021-02-27 DIAGNOSIS — I739 Peripheral vascular disease, unspecified: Secondary | ICD-10-CM | POA: Diagnosis not present

## 2021-03-17 DIAGNOSIS — H52203 Unspecified astigmatism, bilateral: Secondary | ICD-10-CM | POA: Diagnosis not present

## 2021-03-17 DIAGNOSIS — Z961 Presence of intraocular lens: Secondary | ICD-10-CM | POA: Diagnosis not present

## 2021-03-18 DIAGNOSIS — R600 Localized edema: Secondary | ICD-10-CM | POA: Diagnosis not present

## 2021-03-18 DIAGNOSIS — M79604 Pain in right leg: Secondary | ICD-10-CM | POA: Diagnosis not present

## 2021-03-18 DIAGNOSIS — I83811 Varicose veins of right lower extremities with pain: Secondary | ICD-10-CM | POA: Diagnosis not present

## 2021-03-25 ENCOUNTER — Other Ambulatory Visit (HOSPITAL_COMMUNITY): Payer: Self-pay | Admitting: Family Medicine

## 2021-03-25 ENCOUNTER — Other Ambulatory Visit: Payer: Self-pay

## 2021-03-25 ENCOUNTER — Ambulatory Visit (HOSPITAL_COMMUNITY)
Admission: RE | Admit: 2021-03-25 | Discharge: 2021-03-25 | Disposition: A | Discharge status: Home / Self Care | Payer: Medicare HMO | Source: Ambulatory Visit | Attending: Family Medicine | Admitting: Family Medicine

## 2021-03-25 DIAGNOSIS — I739 Peripheral vascular disease, unspecified: Secondary | ICD-10-CM | POA: Diagnosis not present

## 2021-03-25 DIAGNOSIS — I8311 Varicose veins of right lower extremity with inflammation: Secondary | ICD-10-CM | POA: Diagnosis not present

## 2021-03-26 DIAGNOSIS — I83811 Varicose veins of right lower extremities with pain: Secondary | ICD-10-CM | POA: Diagnosis not present

## 2021-03-27 DIAGNOSIS — Z0100 Encounter for examination of eyes and vision without abnormal findings: Secondary | ICD-10-CM | POA: Diagnosis not present

## 2021-04-01 ENCOUNTER — Encounter: Payer: Self-pay | Admitting: Family Medicine

## 2021-04-03 ENCOUNTER — Ambulatory Visit: Payer: Medicare HMO | Admitting: Gastroenterology

## 2021-04-06 ENCOUNTER — Other Ambulatory Visit: Payer: Self-pay

## 2021-04-06 ENCOUNTER — Encounter: Payer: Self-pay | Admitting: Nurse Practitioner

## 2021-04-06 ENCOUNTER — Ambulatory Visit: Payer: Medicare HMO | Admitting: Nurse Practitioner

## 2021-04-06 ENCOUNTER — Other Ambulatory Visit (INDEPENDENT_AMBULATORY_CARE_PROVIDER_SITE_OTHER): Payer: Medicare HMO

## 2021-04-06 VITALS — BP 128/74 | HR 64 | Ht 62.0 in | Wt 201.2 lb

## 2021-04-06 DIAGNOSIS — K219 Gastro-esophageal reflux disease without esophagitis: Secondary | ICD-10-CM

## 2021-04-06 DIAGNOSIS — K921 Melena: Secondary | ICD-10-CM

## 2021-04-06 DIAGNOSIS — Z86010 Personal history of colonic polyps: Secondary | ICD-10-CM

## 2021-04-06 LAB — CBC
HCT: 37.1 % (ref 36.0–46.0)
Hemoglobin: 12.7 g/dL (ref 12.0–15.0)
MCHC: 34.4 g/dL (ref 30.0–36.0)
MCV: 87.9 fl (ref 78.0–100.0)
Platelets: 256 10*3/uL (ref 150.0–400.0)
RBC: 4.22 Mil/uL (ref 3.87–5.11)
RDW: 13.6 % (ref 11.5–15.5)
WBC: 5.8 10*3/uL (ref 4.0–10.5)

## 2021-04-06 MED ORDER — NA SULFATE-K SULFATE-MG SULF 17.5-3.13-1.6 GM/177ML PO SOLN: 1.0000 | Freq: Once | ORAL | 0 refills | Status: AC

## 2021-04-06 NOTE — Patient Instructions (Addendum)
.  If you are age 79 or older, your body mass index should be between 23-30. Your Body mass index is 36.8 kg/m. If this is out of the aforementioned range listed, please consider follow up with your Primary Care Provider.  If you are age 37 or younger, your body mass index should be between 19-25. Your Body mass index is 36.8 kg/m. If this is out of the aformentioned range listed, please consider follow up with your Primary Care Provider.   You have been scheduled for an endoscopy. Please follow written instructions given to you at your visit today. If you use inhalers (even only as needed), please bring them with you on the day of your procedure.  Your provider has requested that you go to the basement level for lab work before leaving today. Press "B" on the elevator. The lab is located at the first door on the left as you exit the elevator.  Continue taking your Omeprazole twice daily.  Remember to eat small bites, chew well with liquids in between bites to avoid food impaction.  Follow up as needed for now.  Thank you for entrusting me with your care and choosing [facility].  Tye Savoy, NP-C

## 2021-04-06 NOTE — Progress Notes (Signed)
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  --Patient will be scheduled for a colonoscopy. The risks and benefits of colonoscopy with possible polypectomy / biopsies were discussed and the patient agrees to proceed.   # GERD, breakthrough symptoms ( nocturnal) and recurrent, intermittent solid food dysphagia on BID Omeprazole.   --continue BID Omeprazole --Anti-reflux measures. .Certain food / beverages may make GERD symptoms worse such as alcohol, chocolate, fried or high fat foods, peppermint, carbonated beverages, spicy foods, onions, tomatoes, garlic, and tomato-based foods, citrus fruits, and juices.  Avoid late evening meals / bedtime snacking.  If able, elevate head of bed 6-8 inches. If unable to elevate the head of the bed consider a wedge pillow.   Weight reduction / maintaining a healthy BMI (body mass index) is important as increased abdominal girth is associated with reflux.  --Schedule for EGD with possible repeat dilation. The risks and benefits of EGD were discussed and the patient agrees to proceed.  --Advised patient to eat small bites, chew well with liquids in between bites to avoid food impaction.  # Intermittent black loose stool x several months in absence of iron or bismuth. Symptoms resolved a few weeks ago). On DRE there is no stool in vault. gloved finger hemoccult negative.  She has a history of gastric and ascending colon vascular ectasia.  Cannot exclude intermittent GI bleeding --CBC --Continue PPI --Further evaluation at time of EGD  HISTORY OF PRESENT ILLNESS     Chief Complaint : acid reflux, food getting stuck in esophagus, loose black stool, it is time for colonscopy  [name] is a 80 y.o. female known to Dr. Silverio Decamp with a past medical history significant for adenomatous colon polyps, depression, anxiety, GERD, hypertension,  hyperlipidemia, gastric and ascending colon vascular ectasia  It is time for patient's surveillance colonoscopy but she would like to discuss some problems with GERD symptoms and swallowing. [name] has been having breakthrough regurgitation / heartburn on BID PPI. Symptoms mainly nocturnal. She has also developed recurrent, intermittent solid food dysphagia.  Patient underwent empirical dilation of her esophagus in 2018 .  She says that it must of help since she has not been back to see us.meat can be a problem to swallow but she recently got choked on scallop potatoes .  When food gets stuck she can usually chase it down with water   In addition to above [name] has had intermittent loose black stool for several months though none in the last month.  She has not been taking bismuth nor iron.  She has no associated abdominal pain, nausea or vomiting .  She takes a baby aspirin, no other significant NSAID use  PREVIOUS EVALUATIONS:    November 2018 EGD for dysphagia --No endoscopic esophageal abnormality to explain patient's dysphagia. Esophagus dilated. Dilated. - Gastric antral vascular ectasia without bleeding. - Small hiatal hernia. - Normal examined duodenum. - No specimens collected  November 2018 colonoscopy  The perianal and digital rectal examinations were normal. - Two sessile polyps were found in the appendiceal orifice and ileocecal valve. The polyps were 1 to 2 mm in size. These polyps were removed with a cold biopsy forceps. Resectionand retrieval were complete. - Five sessile polyps were found in the sigmoid colon, transverse colon and ascending colon. The polyps were 3 to 7 mm in size. These polyps were removed with a cold snare.  Resection and retrieval were complete. - Two small localized angioectasias without bleeding were found in the ascending colon and in the cecum. - A few small-mouthed diverticula were found in the sigmoid colon. - Non-bleeding internal hemorrhoids were  found during retroflexion. The hemorrhoids were Small. Diagnosis 1. Surgical [P], ascending x 2, cecum, ICV, transverse x 2, polyp (6) total - TUBULAR ADENOMA (TWO FRAGMENTS). - SESSILE SERRATED POLYP (FOUR FRAGMENTS). - NO HIGH GRADE DYSPLASIA OR MALIGNANCY. 2. Surgical [P], sigmoid, polyp - HYPERPLASTIC POLYP (ONE FRAGMENT). - NO ADENOMATOUS CHANGE OR MALIGNANCY.    Past Medical History:  Diagnosis Date  . Anginal pain ([place]) 08/29/2012   "have had problems w/this"  . Anxiety 08/29/2012  . Arthritis 08/29/2012   right hand, Seen by Dr. Estanislado Pandy  . CAD (coronary artery disease)    nonobstructive  . Depression   . Exertional dyspnea 08/29/2012  . GERD (gastroesophageal reflux disease)   . Hammer toe    surgery x2, second with Dr. Sharol Given- only other option amputatoin  . HLD (hyperlipidemia)   . HTN (hypertension)   . PNEUMONIA 07/04/2007   Qualifier: Diagnosis of  By: Larose Kells MD, [place] Syncope and collapse 08/29/2012   "first time ever"     Past Surgical History:  Procedure Laterality Date  . ABDOMINAL HYSTERECTOMY  1970's  . APPENDECTOMY  ~ 1970  . CATARACT EXTRACTION W/ INTRAOCULAR LENS  IMPLANT, BILATERAL  ~ 2010   bilateral  . TONSILLECTOMY AND ADENOIDECTOMY  1950's  . VEIN LIGATION AND STRIPPING  2012   bilaterally   Family History  Problem Relation Age of Onset  . Heart disease Mother        brother, father  . Diabetes Mother        sister, brother  . Suicidality Father        31  . Coronary artery disease Brother   . Colon cancer Brother   . Stroke Brother   . Alcoholism Brother   . Ovarian cancer Daughter   . Esophageal cancer Neg Hx   . Rectal cancer Neg Hx   . Stomach cancer Neg Hx   . Liver cancer Neg Hx    Social History   Tobacco Use  . Smoking status: Former Smoker    Packs/day: 1.00    Years: 20.00    Pack years: 20.00    Quit date: 12/27/1978    Years since quitting: 42.3  . Smokeless tobacco: Never Used  . Tobacco comment: teenager  Vaping  Use  . Vaping Use: Never used  Substance Use Topics  . Alcohol use: No  . Drug use: No   Current Outpatient Medications  Medication Sig Dispense Refill  . amLODipine (NORVASC) 2.5 MG tablet Take 1 tablet (2.5 mg total) by mouth daily. 90 tablet 3  . aspirin 81 MG tablet Take 81 mg by mouth daily.     Marland Kitchen atorvastatin (LIPITOR) 80 MG tablet Take 1 tablet by mouth daily in the evening 90 tablet 0  . Cholecalciferol (VITAMIN D3) 50 MCG (2000 UT) TABS Take 1 tablet by mouth daily.    . diclofenac sodium (VOLTAREN) 1 % GEL Apply 3 gm to 3 large joints up to 3 times a day.Dispense 3 tubes with 3 refills. 3 Tube 3  . escitalopram (LEXAPRO) 20 MG tablet TAKE ONE TABLET BY MOUTH ONE TIME DAILY 90 tablet 0  . metoprolol succinate (TOPROL-XL) 25 MG 24 hr tablet Take 1 tablet (25 mg total) by mouth daily.  90 tablet 3  . omeprazole (PRILOSEC) 20 MG capsule TAKE ONE CAPSULE BY MOUTH TWICE DAILY 180 capsule 0  . vitamin B-12 (CYANOCOBALAMIN) 1000 MCG tablet Take 1,000 mcg by mouth daily.    Marland Kitchen triamcinolone cream (KENALOG) 0.1 % APPLY TO AFFECTED AREA TOPICALLY FOR A MAX OF 7-10 DAYS 160 g 1   No current facility-administered medications for this visit.   Allergies  Allergen Reactions  . Clindamycin/Lincomycin     Severe rash and into throat  . Trazodone And Nefazodone Other (See Comments)    drowsiness     Review of Systems: All systems reviewed and negative except where noted in HPI.   PHYSICAL EXAM :    Wt Readings from Last 3 Encounters:  04/06/21 201 lb 3.2 oz (91.3 kg)  11/12/20 201 lb 3.2 oz (91.3 kg)  10/08/20 198 lb 6.4 oz (90 kg)    BP 128/74 (BP Location: Left Arm, Patient Position: Sitting)   Pulse 64   Ht 5\' 2"  (1.575 m)   Wt 201 lb 3.2 oz (91.3 kg)   SpO2 98%   BMI 36.80 kg/m  Constitutional:  Pleasant female in no acute distress. Psychiatric: Normal mood and affect. Behavior is normal. EENT: Pupils normal.  Conjunctivae are normal. No scleral icterus. Neck supple.   Cardiovascular: Normal rate, regular rhythm. No edema Pulmonary/chest: Effort normal and breath sounds normal. No wheezing, rales or rhonchi. Abdominal: Soft, nondistended, nontender. Bowel sounds active throughout. There are no masses palpable. No hepatomegaly. Rectal: No stool or blood in vault on DRE.  Gloved finger and Hemoccult negative Neurological: Alert and oriented to person place and time. Skin: Skin is warm and dry. No rashes noted.  Tye Savoy, NP  04/06/2021, 10:55 AM

## 2021-04-07 ENCOUNTER — Telehealth: Payer: Self-pay | Admitting: Nurse Practitioner

## 2021-04-07 DIAGNOSIS — I83891 Varicose veins of right lower extremities with other complications: Secondary | ICD-10-CM | POA: Diagnosis not present

## 2021-04-07 DIAGNOSIS — I8311 Varicose veins of right lower extremity with inflammation: Secondary | ICD-10-CM | POA: Diagnosis not present

## 2021-04-07 NOTE — Telephone Encounter (Signed)
Inbound call from patient stating Suprep is over $100 which she cannot afford and is asking if we have any samples in office.  Please advise.

## 2021-04-08 NOTE — Telephone Encounter (Signed)
What other prep can I use for Dr. Ardis Hughs?

## 2021-04-09 MED ORDER — PLENVU 140 G PO SOLR: 1.0000 | ORAL | 0 refills | Status: DC

## 2021-04-09 NOTE — Telephone Encounter (Signed)
Patient called to follow up on previous message requesting a call back.

## 2021-04-09 NOTE — Telephone Encounter (Signed)
Patient has been advised that a new  Prep has been sent to her pharmacy and that new she will receive new prep instructions through My Chart.

## 2021-04-10 ENCOUNTER — Telehealth: Payer: Self-pay | Admitting: Family Medicine

## 2021-04-10 NOTE — Chronic Care Management (AMB) (Signed)
  Chronic Care Management   Note  04/10/2021 Name: [name] MRN: [id number] DOB: 28-May-1941  [name] is a 80 y.o. year old female who is a primary care patient of Marin Olp, MD. I reached out to [place] Cellar by phone today in response to a referral sent by [name] PCP, Marin Olp, MD.   Ms. Fair was given information about Chronic Care Management services today including:  1. CCM service includes personalized support from designated clinical staff supervised by her physician, including individualized plan of care and coordination with other care providers 2. 24/7 contact phone numbers for assistance for urgent and routine care needs. 3. Service will only be billed when office clinical staff spend 20 minutes or more in a month to coordinate care. 4. Only one practitioner may furnish and bill the service in a calendar month. 5. The patient may stop CCM services at any time (effective at the end of the month) by phone call to the office staff.   Patient did not agree to enrollment in care management services and does not wish to consider at this time.  Follow up plan:   Lauretta Grill [facility] Scheduler

## 2021-04-16 ENCOUNTER — Telehealth: Payer: Self-pay | Admitting: Nurse Practitioner

## 2021-04-16 NOTE — Telephone Encounter (Signed)
Patient called states the prep medication is still too expensive asking if there are any samples in the office for her.

## 2021-04-17 ENCOUNTER — Ambulatory Visit
Admission: RE | Admit: 2021-04-17 | Discharge: 2021-04-17 | Disposition: A | Discharge status: Home / Self Care | Payer: Medicare HMO | Source: Ambulatory Visit | Attending: Family Medicine | Admitting: Family Medicine

## 2021-04-17 ENCOUNTER — Other Ambulatory Visit: Payer: Self-pay

## 2021-04-17 DIAGNOSIS — Z1231 Encounter for screening mammogram for malignant neoplasm of breast: Secondary | ICD-10-CM

## 2021-04-17 NOTE — Telephone Encounter (Signed)
Spoke with patient and advised that we could switch her to the Miralax prep. She has decided to wait.

## 2021-04-20 NOTE — Progress Notes (Signed)
Reviewed and agree with documentation and assessment and plan. K. Veena Porcha Deblanc , MD   

## 2021-04-22 DIAGNOSIS — I8311 Varicose veins of right lower extremity with inflammation: Secondary | ICD-10-CM | POA: Diagnosis not present

## 2021-04-30 NOTE — Progress Notes (Deleted)
Office Visit Note  Patient: [name]             Date of Birth: 1941/07/15           MRN: [id number]             PCP: Marin Olp, MD Referring: Marin Olp, MD Visit Date: 05/13/2021 Occupation: @GUAROCC @  Subjective:  No chief complaint on file.   History of Present Illness: [name] is a 79 y.o. female ***   Activities of Daily Living:  Patient reports morning stiffness for *** {minute/hour:19697}.   Patient {ACTIONS;DENIES/REPORTS:21021675::"Denies"} nocturnal pain.  Difficulty dressing/grooming: {ACTIONS;DENIES/REPORTS:21021675::"Denies"} Difficulty climbing stairs: {ACTIONS;DENIES/REPORTS:21021675::"Denies"} Difficulty getting out of chair: {ACTIONS;DENIES/REPORTS:21021675::"Denies"} Difficulty using hands for taps, buttons, cutlery, and/or writing: {ACTIONS;DENIES/REPORTS:21021675::"Denies"}  No Rheumatology ROS completed.   PMFS History:  Patient Active Problem List   Diagnosis Date Noted  . Itchy skin 05/30/2020  . 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 Trigger finger, right middle finger 09/05/2017  . History of adenomatous polyp of colon 08/01/2017  . Primary osteoarthritis of both knees 03/10/2017  . Primary osteoarthritis of both hands 03/09/2017  . Primary osteoarthritis of both feet 03/09/2017  . Rheumatoid factor positive 03/09/2017  . Advanced directives, counseling/discussion 01/26/2017  . nonobstructive CAD (coronary artery disease) 12/01/2015  . 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 Insomnia 12/01/2015  . Glaucoma 12/01/2015  . Memory loss 12/01/2015  . Hammer toe   . Arthritis 08/29/2012  . GERD 09/06/2009  . Edema 09/06/2009  . History of syncope 09/06/2009  . 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 Essential hypertension 05/19/2007  . Hyperglycemia 05/19/2007    Past Medical History:  Diagnosis Date  . Anginal pain ([place]) 08/29/2012   "have had problems w/this"  . Anxiety 08/29/2012  .  Arthritis 08/29/2012   right hand, Seen by Dr. Estanislado Pandy  . CAD (coronary artery disease)    nonobstructive  . Depression   . Exertional dyspnea 08/29/2012  . GERD (gastroesophageal reflux disease)   . Hammer toe    surgery x2, second with Dr. Sharol Given- only other option amputatoin  . HLD (hyperlipidemia)   . HTN (hypertension)   . PNEUMONIA 07/04/2007   Qualifier: Diagnosis of  By: Larose Kells MD, [place] Syncope and collapse 08/29/2012   "first time ever"    Family History  Problem Relation Age of Onset  . Heart disease Mother        brother, father  . Diabetes Mother        sister, brother  . Suicidality Father        63  . Coronary artery disease Brother   . Colon cancer Brother   . Stroke Brother   . Alcoholism Brother   . Ovarian cancer Daughter   . Esophageal cancer Neg Hx   . Rectal cancer Neg Hx   . Stomach cancer Neg Hx   . Liver cancer Neg Hx    Past Surgical History:  Procedure Laterality Date  . ABDOMINAL HYSTERECTOMY  1970's  . APPENDECTOMY  ~ 1970  . CATARACT EXTRACTION W/ INTRAOCULAR LENS  IMPLANT, BILATERAL  ~ 04-18-09   bilateral  . TONSILLECTOMY AND ADENOIDECTOMY  1950's  . VEIN LIGATION AND STRIPPING  04/19/11   bilaterally   Social History   Social History Narrative   Widowed Oct 18, 2013. Daughter died 04-18-12. 2 living children. 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 Fluad Quad(high Dose 65+) 09/25/2019, 10/07/2020  . Influenza, High Dose Seasonal PF 08/24/2016, 09/28/2018  . Influenza-Unspecified 11/12/2015, 09/25/2019  . PFIZER Comirnaty(Gray Top)Covid-19 Tri-Sucrose Vaccine 02/17/2021  . PFIZER(Purple Top)SARS-COV-2 Vaccination 01/21/2020, 02/11/2020, 08/19/2020  . Pneumococcal Conjugate-13 08/24/2016  .  Pneumococcal Polysaccharide-23 12/29/1999  . Pneumococcal-Unspecified 08/27/2010  . Td 12/28/2005, 10/26/2016     Objective: Vital Signs: There were no vitals taken for this visit.   Physical Exam   Musculoskeletal Exam: ***  CDAI Exam: CDAI Score: -- Patient Global: --; Provider Global: -- Swollen: --; Tender: -- Joint Exam 05/13/2021   No joint exam has been documented for this visit   There is currently no information documented on the homunculus. Go to the Rheumatology activity and complete the homunculus joint exam.  Investigation: No additional findings.  Imaging: MM 3D SCREEN BREAST BILATERAL  Result Date: 04/20/2021 CLINICAL DATA:  Screening. EXAM: DIGITAL SCREENING BILATERAL MAMMOGRAM WITH TOMOSYNTHESIS AND CAD TECHNIQUE: Bilateral screening digital craniocaudal and mediolateral oblique mammograms were obtained. Bilateral screening digital breast tomosynthesis was performed. The images were evaluated with computer-aided detection. COMPARISON:  Previous exam(s). ACR Breast Density Category b: There are scattered areas of fibroglandular density. FINDINGS: There are no findings suspicious for malignancy. The images were evaluated with computer-aided detection. IMPRESSION: No mammographic evidence of malignancy. A result letter of this screening mammogram will be mailed directly to the patient. RECOMMENDATION: Screening mammogram in one year. (Code:SM-B-01Y) BI-RADS CATEGORY  1: Negative. Electronically Signed   By: Lovey Newcomer M.D.   On: 04/20/2021 13:07    Recent Labs: Lab Results  Component Value Date   WBC 5.8 04/06/2021   HGB 12.7 04/06/2021   PLT 256.0 04/06/2021   NA 141 04/21/2020   K 4.0 04/21/2020   CL 105 04/21/2020   CO2 27 04/21/2020   GLUCOSE 100 (H) 04/21/2020   BUN 17 04/21/2020   CREATININE 0.69 04/21/2020   BILITOT 1.2 04/21/2020   ALKPHOS 64 04/21/2020   AST 28 04/21/2020   ALT 20 04/21/2020   PROT 6.7 04/21/2020   ALBUMIN 4.0 04/21/2020    CALCIUM 9.2 04/21/2020   GFRAA >60 04/21/2020    Speciality Comments: No specialty comments available.  Procedures:  No procedures performed Allergies: Clindamycin/lincomycin and Trazodone and nefazodone   Assessment / Plan:     Visit Diagnoses: No diagnosis found.  Orders: No orders of the defined types were placed in this encounter.  No orders of the defined types were placed in this encounter.   Face-to-face time spent with patient was *** minutes. Greater than 50% of time was spent in counseling and coordination of care.  Follow-Up Instructions: No follow-ups on file.   Earnestine Mealing, CMA  Note - This record has been created using Editor, commissioning.  Chart creation errors have been sought, but may not always  have been located. Such creation errors do not reflect on  the standard of medical care.

## 2021-05-06 ENCOUNTER — Ambulatory Visit (INDEPENDENT_AMBULATORY_CARE_PROVIDER_SITE_OTHER): Payer: Medicare HMO | Admitting: *Deleted

## 2021-05-06 ENCOUNTER — Other Ambulatory Visit: Payer: Self-pay

## 2021-05-06 DIAGNOSIS — Z111 Encounter for screening for respiratory tuberculosis: Secondary | ICD-10-CM

## 2021-05-06 NOTE — Progress Notes (Signed)
Patient present for TB test  TB done on left forearm  Patient tolerated well  Advise to return on Friday after 1030 Patient has appointment schedule

## 2021-05-07 ENCOUNTER — Other Ambulatory Visit: Payer: Self-pay | Admitting: Family Medicine

## 2021-05-07 NOTE — Progress Notes (Signed)
Phone [phone]   Subjective:  Patient presents today for their annual physical. Chief complaint-noted.   See problem oriented charting- ROS- full  review of systems was completed and negative except for: congestion, sinus pressure, sorte throat (not severe like strep- just irritated from coughing), wheezing, cough  The following were reviewed and entered/updated in epic: Past Medical History:  Diagnosis Date  . Anginal pain ([place]) 08/29/2012   "have had problems w/this"  . Anxiety 08/29/2012  . Arthritis 08/29/2012   right hand, Seen by Dr. Estanislado Pandy  . CAD (coronary artery disease)    nonobstructive  . Depression   . Exertional dyspnea 08/29/2012  . GERD (gastroesophageal reflux disease)   . Hammer toe    surgery x2, second with Dr. Sharol Given- only other option amputatoin  . HLD (hyperlipidemia)   . HTN (hypertension)   . PNEUMONIA 07/04/2007   Qualifier: Diagnosis of  By: Larose Kells MD, [place] Syncope and collapse 08/29/2012   "first time ever"   Patient Active Problem List   Diagnosis Date Noted  . Advanced directives, counseling/discussion 01/26/2017    Priority: High  . nonobstructive CAD (coronary artery disease) 12/01/2015    Priority: High  . Memory loss 12/01/2015    Priority: High  . Itchy skin 05/30/2020    Priority: Medium  . History of adenomatous polyp of colon 08/01/2017    Priority: Medium  . Varicose veins of both lower extremities 12/01/2015    Priority: Medium  . 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 Essential hypertension 05/19/2007    Priority: Medium  . Insomnia 12/01/2015    Priority: Low  . Glaucoma 12/01/2015    Priority: Low  . Hammer toe     Priority: Low  . Arthritis 08/29/2012    Priority: Low  . GERD 09/06/2009    Priority: Low  . Edema 09/06/2009    Priority: Low  . History of syncope 09/06/2009    Priority: Low  . Hyperglycemia 05/19/2007    Priority: Low  . 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 Trigger  finger, right middle finger 09/05/2017  . Primary osteoarthritis of both knees 03/10/2017  . Primary osteoarthritis of both hands 03/09/2017  . Primary osteoarthritis of both feet 03/09/2017  . Rheumatoid factor positive 03/09/2017   Past Surgical History:  Procedure Laterality Date  . ABDOMINAL HYSTERECTOMY  1970's  . APPENDECTOMY  ~ 1970  . CATARACT EXTRACTION W/ INTRAOCULAR LENS  IMPLANT, BILATERAL  ~ 2010   bilateral  . TONSILLECTOMY AND ADENOIDECTOMY  1950's  . VEIN LIGATION AND STRIPPING  2012   bilaterally    Family History  Problem Relation Age of Onset  . Heart disease Mother        brother, father  . Diabetes Mother        sister, brother  . Suicidality Father        18  . Coronary artery disease Brother   . Colon cancer Brother   . Stroke Brother   . Alcoholism Brother   . Ovarian cancer Daughter   . Esophageal cancer Neg Hx   . Rectal cancer Neg Hx   . Stomach cancer Neg Hx   . Liver cancer Neg Hx     Medications- reviewed and updated Current Outpatient Medications  Medication Sig Dispense Refill  . amLODipine (NORVASC) 2.5 MG tablet Take 1 tablet (2.5 mg total) by mouth daily. 90 tablet 3  . aspirin 81 MG tablet Take 81  mg by mouth daily.     Marland Kitchen atorvastatin (LIPITOR) 80 MG tablet TAKE ONE TABLET BY MOUTH DAILY IN THE EVENING 90 tablet 0  . Cholecalciferol (VITAMIN D3) 50 MCG (2000 UT) TABS Take 1 tablet by mouth daily.    . diclofenac sodium (VOLTAREN) 1 % GEL Apply 3 gm to 3 large joints up to 3 times a day.Dispense 3 tubes with 3 refills. 3 Tube 3  . escitalopram (LEXAPRO) 20 MG tablet TAKE ONE TABLET BY MOUTH ONE TIME DAILY 90 tablet 0  . metoprolol succinate (TOPROL-XL) 25 MG 24 hr tablet Take 1 tablet (25 mg total) by mouth daily. 90 tablet 3  . omeprazole (PRILOSEC) 20 MG capsule TAKE ONE CAPSULE BY MOUTH TWICE DAILY 180 capsule 0  . PEG-KCl-NaCl-NaSulf-Na Asc-C (PLENVU) 140 g SOLR Take 1 kit by mouth as directed. **Coupon Code BIN# [id number] PCN# [id number]  GRP# [id number] ID# [id number] ** Coupon Code from [facility] 1 each 0  . vitamin B-12 (CYANOCOBALAMIN) 1000 MCG tablet Take 1,000 mcg by mouth daily.     No current facility-administered medications for this visit.    Allergies-reviewed and updated Allergies  Allergen Reactions  . Clindamycin/Lincomycin     Severe rash and into throat  . Trazodone And Nefazodone Other (See Comments)    drowsiness    Social History   Social History Narrative   Widowed 20-Oct-2013. Daughter died 21-Feb-2012. 2 living children. 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 Oropharynx normal-slight drainage- no significant tonsillar edema or discharge.  Tympanic membranes normal bilaterally.  Nasal turbinates slightly swollen with clear rhinorrhea Neck: no thyromegaly CV: RRR no murmurs rubs or gallops Lungs: CTAB no crackles, wheeze, rhonchi Abdomen: soft/nontender/nondistended/normal bowel sounds. No rebound or guarding.  Ext: no edema Skin: warm, dry Neuro: grossly normal, moves all extremities, PERRLA   Assessment and Plan   80 y.o. female presenting for annual physical.  Health Maintenance counseling: 1. Anticipatory guidance: Patient counseled regarding regular dental exams -q6 months, eye exams-follows yearly as she has glaucoma,  avoiding smoking and second hand smoke , limiting alcohol to 1 beverage per day-once a month or less, no drugs .   2. Risk factor reduction:  Advised patient of need for regular exercise and diet rich and fruits and vegetables to reduce risk of heart attack  and stroke. Exercise-5 days a week at the [employer] some swimming. Diet-stable,   Patient was at 198 last total December 2020- trying to eat reasonably healthy Wt Readings from Last 3 Encounters:  05/08/21 198 lb 9.6 oz (90.1 kg)  04/06/21 201 lb 3.2 oz (91.3 kg)  11/12/20 201 lb 3.2 oz (91.3 kg)  3. Immunizations/screenings/ancillary studies-discussed Prevnar 20- hold off with illness.  Discussed Shingrix at pharmacy-unfortunately was very costly last time she checked.  Discussed hepatitis C screening  Immunization History  Administered Date(s) Administered  . Fluad Quad(high Dose 65+) 09/25/2019, 10/07/2020  . Influenza, High  Dose Seasonal PF 08/24/2016, 09/28/2018  . Influenza-Unspecified 11/12/2015, 09/25/2019  . PFIZER Comirnaty(Gray Top)Covid-19 Tri-Sucrose Vaccine 02/17/2021  . PFIZER(Purple Top)SARS-COV-2 Vaccination 01/21/2020, 02/11/2020, 08/19/2020  . PPD Test 05/06/2021  . Pneumococcal Conjugate-13 08/24/2016  . Pneumococcal Polysaccharide-23 12/29/1999  . Pneumococcal-Unspecified 08/27/2010  . Td 12/28/2005, 10/26/2016   4. Cervical cancer screening- denies history of abnormal Pap smear-past age based screening recommendations 5. Breast cancer screening-  breast exam-prefer self exams-and mammogram-at her age she is opted to discontinue checks previously but changed her mind and did one this yera 6. Colon cancer screening - history of adenomatous colon polyp number 08/15/2017 with 3-year repeat planned.  She has upcoming visit with GI for EGD and colonoscopy in june 7. Skin cancer screening-scheduled a visit for first time. advised regular sunscreen use. Denies worrisome, changing, or new skin lesions.  8. Birth control/STD check-not dating. 9. Osteoporosis screening at 65- updated order today- last several years ago -Never smoker  Status of chronic or acute concerns    # cough- started on Tuesday. Tested for covid this AM and already got pcr results back and were  negative from [facility]. No fever/chills. 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 Started with runny nose and now with some throat congestion. Yellow discharge when she coughs. White discharge from nose. Some sinus pressure.  - she will reach out if not better by day 10 or if symptoms improve then worsen or continue to worsen through weekend- would be signs of bacterial infection  #nonobstructive CAD - no chest pain or shortness of breath #hyperlipidemia with LDL goal under 70 S: Medication: Atorvastatin 80 mg, aspirin 81 mg,  Lab Results  Component Value Date   CHOL 161 11/30/2019   HDL 66.40 11/30/2019   LDLCALC 79 11/30/2019   LDLDIRECT 65.0 05/30/2020   TRIG 79.0 11/30/2019   CHOLHDL 2 11/30/2019   A/P: CAD nonobstructive asymptomatic. Last LDL was at goal- continue current medication- statin and aspirin   #hypertension S: medication: Metoprolol 25Mg daily, amlodipine 2.31m daily. BP Readings from Last 3 Encounters:  05/08/21 138/76  04/06/21 128/74  11/12/20 115/61  A/P: reasonably well controlled- continue current meds  # Hyperglycemia/insulin resistance/prediabetes- peak 5.9 13 years ago S:  Medication: none Exercise and diet- see above Lab Results  Component Value Date   HGBA1C 5.6 08/01/2017   HGBA1C 5.4 12/01/2015   HGBA1C 5.5 06/14/2014    A/P: hopefully remains out o fprediabetes range- update a1c today  # Depression S: Medication:Escitalopram 20 mg -Patient has reported some memory issues when depressed- stable last check at 29 out of 30 MMSE [facility] 05/08/2021 08/25/2020 05/30/2020  Decreased Interest 0 0 0  Down, Depressed, Hopeless 0 0 0  PHQ - 2 Score 0 0 0  Altered sleeping _0 Tired, decreased energy 0 0 2  Change in appetite 0 0 0  Feeling bad or failure about yourself  0 0 0  Trouble concentrating 0 0 0  Moving slowly or fidgety/restless 0 0 0  Suicidal thoughts 0 0 0  PHQ-9 Score _1 Difficult doing work/chores Not  difficult at all Not difficult at all Not difficult at all  Some recent data might be hidden   A/P: memory has done well on meds. Feels [employer] very helpful as well. Full remission- continue current meds   # GERD S:Medication: Omeprazole 20 mg twice daily.  Breakthrough symptoms even on twice a day-plan is for upcoming EGD A/P: mild poor control-  agree with EGD    #Primary osteoarthritis of the hands-follows with Dr. Estanislado Pandy rheumatology-plan has been to consider [facility] injections.  She does have positive rheumatoid factor but not thought to be rheumatoid arthritis  Recommended follow up: Return in about 6 months (around 11/08/2021) for follow up- or sooner if needed. Future Appointments  Date Time Provider [place]  05/13/2021  1:00 PM Bo Merino, MD [facility] None  06/08/2021  2:30 PM Mauri Pole, MD [facility]  08/19/2021 11:00 AM [place], Ronalee Red, PA-C [facility]   Lab/Order associations: fasting   ICD-10-CM   1. Preventative health care  Z00.00 Comprehensive metabolic panel    Lipid panel    Hemoglobin A1c    Hepatitis C antibody    CBC with Differential/Platelet  2. Essential hypertension  I10 Comprehensive metabolic panel    Lipid panel    CBC with Differential/Platelet  3. Hyperglycemia  R73.9 Hemoglobin A1c  4. Encounter for hepatitis C screening test for low risk patient  Z11.59 Hepatitis C antibody  5. Encounter for screening colonoscopy  Z12.11   6. Postmenopausal  Z78.0 DG Bone Density    No orders of the defined types were placed in this encounter.   Return precautions advised.  Garret Reddish, MD

## 2021-05-07 NOTE — Patient Instructions (Addendum)
Please stop by lab before you go If you have mychart- we will send your results within 3 business days of [place] receiving them.  If you do not have mychart- we will call you about results within 5 business days of [place] receiving them.  *please also note that you will see labs on mychart as soon as they post. I will later go in and write notes on them- will say "notes from Dr. Yong Channel"  Health Maintenance Due  Topic Date Due  . COLONOSCOPY - Scheduled for 06/12/2021 11/03/2020   Schedule your bone density test at check out desk. You may also call directly to X-ray at [phone] to schedule an appointment that is convenient for you.  - located [address] across the [address] from [place] - in the basement - you do need an appointment for the bone density tests.   Recommended follow up: Return in about 6 months (around 11/08/2021) for follow up- or sooner if needed.

## 2021-05-08 ENCOUNTER — Encounter: Payer: Self-pay | Admitting: Family Medicine

## 2021-05-08 ENCOUNTER — Ambulatory Visit (INDEPENDENT_AMBULATORY_CARE_PROVIDER_SITE_OTHER): Payer: Medicare HMO | Admitting: Family Medicine

## 2021-05-08 ENCOUNTER — Other Ambulatory Visit: Payer: Self-pay

## 2021-05-08 VITALS — BP 138/76 | HR 55 | Temp 97.6°F | Ht 62.0 in | Wt 198.6 lb

## 2021-05-08 DIAGNOSIS — Z780 Asymptomatic menopausal state: Secondary | ICD-10-CM

## 2021-05-08 DIAGNOSIS — R739 Hyperglycemia, unspecified: Secondary | ICD-10-CM

## 2021-05-08 DIAGNOSIS — Z1211 Encounter for screening for malignant neoplasm of colon: Secondary | ICD-10-CM

## 2021-05-08 DIAGNOSIS — I10 Essential (primary) hypertension: Secondary | ICD-10-CM

## 2021-05-08 DIAGNOSIS — Z0000 Encounter for general adult medical examination without abnormal findings: Secondary | ICD-10-CM | POA: Diagnosis not present

## 2021-05-08 DIAGNOSIS — Z20822 Contact with and (suspected) exposure to covid-19: Secondary | ICD-10-CM | POA: Diagnosis not present

## 2021-05-08 DIAGNOSIS — R059 Cough, unspecified: Secondary | ICD-10-CM | POA: Diagnosis not present

## 2021-05-08 DIAGNOSIS — Z1159 Encounter for screening for other viral diseases: Secondary | ICD-10-CM

## 2021-05-08 DIAGNOSIS — J029 Acute pharyngitis, unspecified: Secondary | ICD-10-CM | POA: Diagnosis not present

## 2021-05-08 LAB — CBC WITH DIFFERENTIAL/PLATELET
Basophils Absolute: 0 10*3/uL (ref 0.0–0.1)
Basophils Relative: 0.7 % (ref 0.0–3.0)
Eosinophils Absolute: 0.3 10*3/uL (ref 0.0–0.7)
Eosinophils Relative: 6.2 % — ABNORMAL HIGH (ref 0.0–5.0)
HCT: 37.4 % (ref 36.0–46.0)
Hemoglobin: 12.7 g/dL (ref 12.0–15.0)
Lymphocytes Relative: 35.1 % (ref 12.0–46.0)
Lymphs Abs: 1.9 10*3/uL (ref 0.7–4.0)
MCHC: 34.1 g/dL (ref 30.0–36.0)
MCV: 87.6 fl (ref 78.0–100.0)
Monocytes Absolute: 0.3 10*3/uL (ref 0.1–1.0)
Monocytes Relative: 5 % (ref 3.0–12.0)
Neutro Abs: 2.8 10*3/uL (ref 1.4–7.7)
Neutrophils Relative %: 53 % (ref 43.0–77.0)
Platelets: 240 10*3/uL (ref 150.0–400.0)
RBC: 4.26 Mil/uL (ref 3.87–5.11)
RDW: 13.6 % (ref 11.5–15.5)
WBC: 5.3 10*3/uL (ref 4.0–10.5)

## 2021-05-08 LAB — COMPREHENSIVE METABOLIC PANEL
ALT: 15 U/L (ref 0–35)
AST: 23 U/L (ref 0–37)
Albumin: 4.1 g/dL (ref 3.5–5.2)
Alkaline Phosphatase: 70 U/L (ref 39–117)
BUN: 20 mg/dL (ref 6–23)
CO2: 28 mEq/L (ref 19–32)
Calcium: 9 mg/dL (ref 8.4–10.5)
Chloride: 106 mEq/L (ref 96–112)
Creatinine, Ser: 0.79 mg/dL (ref 0.40–1.20)
GFR: 71.21 mL/min (ref >60.00)
Glucose, Bld: 85 mg/dL (ref 70–99)
Potassium: 4 mEq/L (ref 3.5–5.1)
Sodium: 142 mEq/L (ref 135–145)
Total Bilirubin: 0.8 mg/dL (ref 0.2–1.2)
Total Protein: 6.9 g/dL (ref 6.0–8.3)

## 2021-05-08 LAB — LIPID PANEL
Cholesterol: 144 mg/dL (ref 0–200)
HDL: 55.8 mg/dL (ref >39.00)
LDL Cholesterol: 69 mg/dL (ref 0–99)
NonHDL: 88.33
Total CHOL/HDL Ratio: 3
Triglycerides: 99 mg/dL (ref 0.0–149.0)
VLDL: 19.8 mg/dL (ref 0.0–40.0)

## 2021-05-08 LAB — TB SKIN TEST
Induration: 0 mm
TB Skin Test: NEGATIVE

## 2021-05-08 LAB — HEMOGLOBIN A1C: Hgb A1c MFr Bld: 5.9 % (ref 4.6–6.5)

## 2021-05-11 LAB — HEPATITIS C ANTIBODY
Hepatitis C Ab: NONREACTIVE
SIGNAL TO CUT-OFF: 0.02 (ref <1.00)

## 2021-05-13 ENCOUNTER — Ambulatory Visit: Payer: Medicare HMO | Admitting: Rheumatology

## 2021-05-13 DIAGNOSIS — M19041 Primary osteoarthritis, right hand: Secondary | ICD-10-CM

## 2021-05-13 DIAGNOSIS — Z8659 Personal history of other mental and behavioral disorders: Secondary | ICD-10-CM

## 2021-05-13 DIAGNOSIS — Z8719 Personal history of other diseases of the digestive system: Secondary | ICD-10-CM

## 2021-05-13 DIAGNOSIS — Z8639 Personal history of other endocrine, nutritional and metabolic disease: Secondary | ICD-10-CM

## 2021-05-13 DIAGNOSIS — Z13820 Encounter for screening for osteoporosis: Secondary | ICD-10-CM

## 2021-05-13 DIAGNOSIS — F5101 Primary insomnia: Secondary | ICD-10-CM

## 2021-05-13 DIAGNOSIS — Z86010 Personal history of colonic polyps: Secondary | ICD-10-CM

## 2021-05-13 DIAGNOSIS — R768 Other specified abnormal immunological findings in serum: Secondary | ICD-10-CM

## 2021-05-13 DIAGNOSIS — M170 Bilateral primary osteoarthritis of knee: Secondary | ICD-10-CM

## 2021-05-13 DIAGNOSIS — M180 Bilateral primary osteoarthritis of first carpometacarpal joints: Secondary | ICD-10-CM

## 2021-05-13 DIAGNOSIS — M19071 Primary osteoarthritis, right ankle and foot: Secondary | ICD-10-CM

## 2021-05-13 DIAGNOSIS — Z8679 Personal history of other diseases of the circulatory system: Secondary | ICD-10-CM

## 2021-05-18 ENCOUNTER — Ambulatory Visit (INDEPENDENT_AMBULATORY_CARE_PROVIDER_SITE_OTHER)
Admission: RE | Admit: 2021-05-18 | Discharge: 2021-05-18 | Disposition: A | Discharge status: Home / Self Care | Payer: Medicare HMO | Source: Ambulatory Visit | Attending: Family Medicine | Admitting: Family Medicine

## 2021-05-18 ENCOUNTER — Other Ambulatory Visit: Payer: Self-pay

## 2021-05-18 DIAGNOSIS — Z780 Asymptomatic menopausal state: Secondary | ICD-10-CM

## 2021-05-20 ENCOUNTER — Other Ambulatory Visit: Payer: Self-pay | Admitting: Family Medicine

## 2021-06-02 ENCOUNTER — Other Ambulatory Visit: Payer: Self-pay | Admitting: Family Medicine

## 2021-06-08 ENCOUNTER — Encounter: Payer: Self-pay | Admitting: Gastroenterology

## 2021-06-08 ENCOUNTER — Other Ambulatory Visit: Payer: Self-pay

## 2021-06-08 ENCOUNTER — Ambulatory Visit (AMBULATORY_SURGERY_CENTER): Payer: Medicare HMO | Admitting: Gastroenterology

## 2021-06-08 VITALS — BP 147/60 | HR 56 | Temp 97.2°F | Resp 15 | Ht 62.0 in | Wt 201.0 lb

## 2021-06-08 DIAGNOSIS — K319 Disease of stomach and duodenum, unspecified: Secondary | ICD-10-CM | POA: Diagnosis not present

## 2021-06-08 DIAGNOSIS — K2990 Gastroduodenitis, unspecified, without bleeding: Secondary | ICD-10-CM

## 2021-06-08 DIAGNOSIS — K449 Diaphragmatic hernia without obstruction or gangrene: Secondary | ICD-10-CM | POA: Diagnosis not present

## 2021-06-08 DIAGNOSIS — R1310 Dysphagia, unspecified: Secondary | ICD-10-CM | POA: Diagnosis not present

## 2021-06-08 DIAGNOSIS — K219 Gastro-esophageal reflux disease without esophagitis: Secondary | ICD-10-CM | POA: Diagnosis not present

## 2021-06-08 DIAGNOSIS — K921 Melena: Secondary | ICD-10-CM

## 2021-06-08 DIAGNOSIS — K635 Polyp of colon: Secondary | ICD-10-CM

## 2021-06-08 DIAGNOSIS — R195 Other fecal abnormalities: Secondary | ICD-10-CM | POA: Diagnosis not present

## 2021-06-08 DIAGNOSIS — Z86010 Personal history of colonic polyps: Secondary | ICD-10-CM | POA: Diagnosis not present

## 2021-06-08 DIAGNOSIS — D120 Benign neoplasm of cecum: Secondary | ICD-10-CM

## 2021-06-08 DIAGNOSIS — D123 Benign neoplasm of transverse colon: Secondary | ICD-10-CM

## 2021-06-08 DIAGNOSIS — K2970 Gastritis, unspecified, without bleeding: Secondary | ICD-10-CM

## 2021-06-08 MED ORDER — OMEPRAZOLE 40 MG PO CPDR: 40.0000 mg | DELAYED_RELEASE_CAPSULE | Freq: Every day | ORAL | 0 refills | Status: DC

## 2021-06-08 MED ORDER — SODIUM CHLORIDE 0.9 % IV SOLN: 500.0000 mL | Freq: Once | INTRAVENOUS | Status: DC

## 2021-06-08 NOTE — Op Note (Signed)
[place] Patient Name: [name] Procedure Date: 06/08/2021 1:54 PM MRN: [id number] Endoscopist: Mauri Pole , MD Age: 80 Referring MD:  Date of Birth: 07/07/1941 Gender: Female Account #: [id number] Procedure:                Colonoscopy Indications:              High risk colon cancer surveillance: Personal                            history of colonic polyps, High risk colon cancer                            surveillance: Personal history of multiple (3 or                            more) adenomas Medicines:                Monitored Anesthesia Care Procedure:                Pre-Anesthesia Assessment:                           - Prior to the procedure, a History and Physical                            was performed, and patient medications and                            allergies were reviewed. The patient's tolerance of                            previous anesthesia was also reviewed. The risks                            and benefits of the procedure and the sedation                            options and risks were discussed with the patient.                            All questions were answered, and informed consent                            was obtained. Prior Anticoagulants: The patient has                            taken no previous anticoagulant or antiplatelet                            agents. ASA Grade Assessment: II - A patient with                            mild systemic disease. After reviewing the risks  and benefits, the patient was deemed in                            satisfactory condition to undergo the procedure.                           After obtaining informed consent, the colonoscope                            was passed under direct vision. Throughout the                            procedure, the patient's blood pressure, pulse, and                            oxygen saturations were monitored  continuously. The                            Olympus [id number]-[id number] (#[id number]) Colonoscope was                            introduced through the anus and advanced to the the                            cecum, identified by appendiceal orifice and                            ileocecal valve. The colonoscopy was performed                            without difficulty. The patient tolerated the                            procedure well. The quality of the bowel                            preparation was good. The ileocecal valve,                            appendiceal orifice, and rectum were photographed. Scope In: 2:13:38 PM Scope Out: 2:29:46 PM Scope Withdrawal Time: 0 hours 11 minutes 24 seconds  Total Procedure Duration: 0 hours 16 minutes 8 seconds  Findings:                 The perianal and digital rectal examinations were                            normal.                           Three sessile and semi-pedunculated polyps were                            found in the transverse colon and cecum. The polyps  were 7 to 11 mm in size. These polyps were removed                            with a hot snare. Resection and retrieval were                            complete.                           A few medium-sized patchy angioectasias with                            typical arborization were found in the ascending                            colon and in the cecum.                           A few small-mouthed diverticula were found in the                            sigmoid colon and descending colon.                           Non-bleeding external and internal hemorrhoids were                            found during retroflexion. The hemorrhoids were                            small. Complications:            No immediate complications. Estimated Blood Loss:     Estimated blood loss was minimal. Impression:               - Three 7 to 11 mm polyps in the transverse  colon                            and in the cecum, removed with a hot snare.                            Resected and retrieved.                           - A few colonic angioectasias.                           - Diverticulosis in the sigmoid colon and in the                            descending colon.                           - Non-bleeding external and internal hemorrhoids. Recommendation:           - Patient has a contact number available for  emergencies. The signs and symptoms of potential                            delayed complications were discussed with the                            patient. Return to normal activities tomorrow.                            Written discharge instructions were provided to the                            patient.                           - Resume previous diet.                           - Continue present medications.                           - Await pathology results.                           - No repeat colonoscopy due to age. Mauri Pole, MD 06/08/2021 2:43:45 PM This report has been signed electronically.

## 2021-06-08 NOTE — Progress Notes (Signed)
Called to room to assist during endoscopic procedure.  Patient ID and intended procedure confirmed with present staff. Received instructions for my participation in the procedure from the performing physician.  

## 2021-06-08 NOTE — Progress Notes (Signed)
To PACU, VSS. Report to [email or web address] 

## 2021-06-08 NOTE — Op Note (Addendum)
[place] Patient Name: [name] Procedure Date: 06/08/2021 2:00 PM MRN: [id number] Endoscopist: Mauri Pole , MD Age: 80 Referring MD:  Date of Birth: [birth year] Gender: Female Account #: [id number] Procedure:                Upper GI endoscopy Indications:              Dysphagia, Esophageal reflux symptoms that persist                            despite appropriate therapy Medicines:                Monitored Anesthesia Care Procedure:                Pre-Anesthesia Assessment:                           - Prior to the procedure, a History and Physical                            was performed, and patient medications and                            allergies were reviewed. The patient's tolerance of                            previous anesthesia was also reviewed. The risks                            and benefits of the procedure and the sedation                            options and risks were discussed with the patient.                            All questions were answered, and informed consent                            was obtained. Prior Anticoagulants: The patient has                            taken no previous anticoagulant or antiplatelet                            agents. ASA Grade Assessment: II - A patient with                            mild systemic disease. After reviewing the risks                            and benefits, the patient was deemed in                            satisfactory condition to undergo the procedure.  After obtaining informed consent, the endoscope was                            passed under direct vision. Throughout the                            procedure, the patient's blood pressure, pulse, and                            oxygen saturations were monitored continuously. The                            Endoscope was introduced through the mouth, and                            advanced to the  second part of duodenum. The upper                            GI endoscopy was accomplished without difficulty.                            The patient tolerated the procedure well. Scope In: Scope Out: Findings:                 The Z-line was regular and was found 35 cm from the                            incisors.                           The gastroesophageal flap valve was visualized                            endoscopically and classified as Hill Grade IV (no                            fold, wide open lumen, hiatal hernia present).                           No endoscopic abnormality was evident in the                            esophagus to explain the patient's complaint of                            dysphagia.                           A 4 cm hiatal hernia was present.                           Patchy mild inflammation characterized by                            congestion (edema), erosions and erythema was found  in the prepyloric region of the stomach. Biopsies                            were taken with a cold forceps for Helicobacter                            pylori testing.                           The examined duodenum was normal.                           The cardia and gastric fundus were normal on                            retroflexion. Complications:            No immediate complications. Estimated Blood Loss:     Estimated blood loss was minimal. Impression:               - Z-line regular, 35 cm from the incisors.                           - Gastroesophageal flap valve classified as Hill                            Grade IV (no fold, wide open lumen, hiatal hernia                            present).                           - No endoscopic esophageal abnormality to explain                            patient's dysphagia.                           - 4 cm hiatal hernia.                           - Gastritis. Biopsied.                            - Normal examined duodenum. Recommendation:           - Patient has a contact number available for                            emergencies. The signs and symptoms of potential                            delayed complications were discussed with the                            patient. Return to normal activities tomorrow.  Written discharge instructions were provided to the                            patient.                           - Resume previous diet.                           - Continue present medications.                           - Await pathology results.                           - Follow an antireflux regimen.                           - Use Prilosec (omeprazole) 40 mg PO daily for 3                            months.                           - Return to my office at the next available                            appointment in 2-3 months.                           - No ibuprofen, naproxen, or other non-steroidal                            anti-inflammatory drugs. Mauri Pole, MD 06/08/2021 2:40:40 PM This report has been signed electronically.

## 2021-06-08 NOTE — Patient Instructions (Signed)
Handouts were given to your care partner on a hiatal hernia, GERD, gastritis, polyps, diverticulosis, and hemorrhoids. Begin taking Prilosec  40 mg ( Omeprazole) daily for 3 months.  It is best to take on an empty stomach, 20-30 minutes before breakfast.  Prescription was sent to your pharmacy to be picked up.  Stop Omeprazole 20 mg.  No ibuprofen, naproxen, or other non-steroidal anti-inflammatory drugs. The topical Voltaren Gel and Aspirin 81 mg are both ok to take per Dr. Silverio Decamp. You may resume your other current medications today. Await biopsy results.  May take 1-3 weeks to receive pathology results. The office will call you to schedule a follow up appointment in 2-3 months. No repeat colonoscopy due to age. Please call if any questions or concerns.       YOU HAD AN ENDOSCOPIC PROCEDURE TODAY AT [facility]:   Refer to the procedure report that was given to you for any specific questions about what was found during the examination.  If the procedure report does not answer your questions, please call your gastroenterologist to clarify.  If you requested that your care partner not be given the details of your procedure findings, then the procedure report has been included in a sealed envelope for you to review at your convenience later.  YOU SHOULD EXPECT: Some feelings of bloating in the abdomen. Passage of more gas than usual.  Walking can help get rid of the air that was put into your GI tract during the procedure and reduce the bloating. If you had a lower endoscopy (such as a colonoscopy or flexible sigmoidoscopy) you may notice spotting of blood in your stool or on the toilet paper. If you underwent a bowel prep for your procedure, you may not have a normal bowel movement for a few days.  Please Note:  You might notice some irritation and congestion in your nose or some drainage.  This is from the oxygen used during your procedure.  There is no need for concern and it  should clear up in a day or so.  SYMPTOMS TO REPORT IMMEDIATELY:  Following lower endoscopy (colonoscopy or flexible sigmoidoscopy):  Excessive amounts of blood in the stool  Significant tenderness or worsening of abdominal pains  Swelling of the abdomen that is new, acute  Fever of 100F or higher  Following upper endoscopy (EGD)  Vomiting of blood or coffee ground material  New chest pain or pain under the shoulder blades  Painful or persistently difficult swallowing  New shortness of breath  Fever of 100F or higher  Black, tarry-looking stools  For urgent or emergent issues, a gastroenterologist can be reached at any hour by calling [phone]. Do not use MyChart messaging for urgent concerns.    DIET:  We do recommend a small meal at first, but then you may proceed to your regular diet.  Drink plenty of fluids but you should avoid alcoholic beverages for 24 hours.  ACTIVITY:  You should plan to take it easy for the rest of today and you should NOT DRIVE or use heavy machinery until tomorrow (because of the sedation medicines used during the test).    FOLLOW UP: Our staff will call the number listed on your records 48-72 hours following your procedure to check on you and address any questions or concerns that you may have regarding the information given to you following your procedure. If we do not reach you, we will leave a message.  We will attempt to  reach you two times.  During this call, we will ask if you have developed any symptoms of COVID 19. If you develop any symptoms (ie: fever, flu-like symptoms, shortness of breath, cough etc.) before then, please call [phone].  If you test positive for Covid 19 in the 2 weeks post procedure, please call and report this information to [place].    If any biopsies were taken you will be contacted by phone or by letter within the next 1-3 weeks.  Please call us at [phone] if you have not heard about the biopsies in 3 weeks.     SIGNATURES/CONFIDENTIALITY: You and/or your care partner have signed paperwork which will be entered into your electronic medical record.  These signatures attest to the fact that that the information above on your After Visit Summary has been reviewed and is understood.  Full responsibility of the confidentiality of this discharge information lies with you and/or your care-partner.

## 2021-06-08 NOTE — Progress Notes (Signed)
New rx for omeprazole 40 mg #90 no reill was sent to [facility].  Daily 30 minutes before breakfast.   No problems noted in the recovery room. Maw

## 2021-06-10 ENCOUNTER — Telehealth: Payer: Self-pay

## 2021-06-10 NOTE — Telephone Encounter (Signed)
  Follow up Call-  Call back number 06/08/2021  Post procedure Call Back phone  # [phone]  Permission to leave phone message Yes  Some recent data might be hidden     Patient questions:  Do you have a fever, pain , or abdominal swelling? No. Pain Score  0 *  Have you tolerated food without any problems? Yes.    Have you been able to return to your normal activities? Yes.    Do you have any questions about your discharge instructions: Diet   No. Medications  No. Follow up visit  No.  Do you have questions or concerns about your Care? No.  Actions: * If pain score is 4 or above: No action needed, pain <4.  Have you developed a fever since your procedure? No   2.   Have you had an respiratory symptoms (SOB or cough) since your procedure? No   3.   Have you tested positive for COVID 19 since your procedure? No   4.   Have you had any family members/close contacts diagnosed with the COVID 19 since your procedure?  No    If yes to any of these questions please route to Joylene John, RN and Joella Prince, RN

## 2021-06-11 ENCOUNTER — Telehealth: Payer: Self-pay

## 2021-06-11 ENCOUNTER — Other Ambulatory Visit: Payer: Self-pay

## 2021-06-12 NOTE — Telephone Encounter (Signed)
Error

## 2021-06-16 ENCOUNTER — Encounter: Payer: Self-pay | Admitting: Gastroenterology

## 2021-06-27 ENCOUNTER — Other Ambulatory Visit: Payer: Self-pay | Admitting: Family Medicine

## 2021-07-27 DIAGNOSIS — H903 Sensorineural hearing loss, bilateral: Secondary | ICD-10-CM | POA: Diagnosis not present

## 2021-07-29 NOTE — Progress Notes (Signed)
Office Visit Note  Patient: [name]             Date of Birth: 1941-04-29           MRN: [id number]             PCP: Marin Olp, MD Referring: Marin Olp, MD Visit Date: 08/12/2021 Occupation: '@GUAROCC'$ @  Subjective:  Right thumb pain   History of Present Illness: [name] is a 80 y.o. female history of osteoarthritis.  She has been having increased pain and discomfort in her hands.  She states her right [facility] joint has been very painful.  She has been having nocturnal pain.  She had cortisone injections in her bilateral CMC joints in November 2021 which was very helpful.  She denies any discomfort in her knees and her feet today.  Activities of Daily Living:  Patient reports morning stiffness for 0 minutes.   Patient Reports nocturnal pain.  Difficulty dressing/grooming: Denies Difficulty climbing stairs: Denies Difficulty getting out of chair: Denies Difficulty using hands for taps, buttons, cutlery, and/or writing: Denies  Review of Systems  Constitutional:  Negative for fatigue.  HENT:  Negative for mouth sores, mouth dryness and nose dryness.   Eyes:  Negative for pain, itching and dryness.  Respiratory:  Negative for shortness of breath and difficulty breathing.   Cardiovascular:  Negative for chest pain and palpitations.  Gastrointestinal:  Negative for blood in stool, constipation and diarrhea.  Endocrine: Negative for increased urination.  Genitourinary:  Negative for difficulty urinating.  Musculoskeletal:  Positive for joint pain and joint pain. Negative for joint swelling, myalgias, morning stiffness, muscle tenderness and myalgias.  Skin:  Negative for color change, rash and redness.  Allergic/Immunologic: Negative for susceptible to infections.  Neurological:  Negative for dizziness, numbness, headaches, memory loss and weakness.  Hematological:  Positive for bruising/bleeding tendency.  Psychiatric/Behavioral:  Negative for  confusion.    PMFS History:  Patient Active Problem List   Diagnosis Date Noted   Itchy skin 05/30/2020   Diaphoresis 05/12/2018   Trigger finger, right middle finger 09/05/2017   History of adenomatous polyp of colon 08/01/2017   Primary osteoarthritis of both knees 03/10/2017   Primary osteoarthritis of both hands 03/09/2017   Primary osteoarthritis of both feet 03/09/2017   Rheumatoid factor positive 03/09/2017   Advanced directives, counseling/discussion 01/26/2017   nonobstructive CAD (coronary artery disease) 12/01/2015   Varicose veins of both lower extremities 12/01/2015   Insomnia 12/01/2015   Glaucoma 12/01/2015   Memory loss 12/01/2015   Hammer toe    Arthritis 08/29/2012   GERD 09/06/2009   Edema 09/06/2009   History of syncope 09/06/2009   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 Estanislado Pandy   CAD (coronary artery disease)    nonobstructive   Depression    Exertional dyspnea 08/29/2012   GERD (gastroesophageal reflux disease)    Hammer toe    surgery x2, second with Dr. Sharol Given- only other option amputatoin   HLD (hyperlipidemia)    HTN (hypertension)    PNEUMONIA 07/04/2007   Qualifier: Diagnosis of  By: Larose Kells MD, [place]    Syncope and collapse 08/29/2012   "first time ever"    Family History  Problem Relation Age of Onset   Heart disease Mother        brother, father   Diabetes Mother        sister, brother   Suicidality Father        86   Coronary artery disease Brother    Colon cancer Brother    Stroke Brother    Alcoholism Brother    Ovarian cancer Daughter    Esophageal cancer Neg Hx    Rectal cancer Neg Hx    Stomach cancer Neg Hx    Liver cancer Neg Hx    Past Surgical History:  Procedure Laterality Date    ABDOMINAL HYSTERECTOMY  1970's   APPENDECTOMY  ~ 1970   CATARACT EXTRACTION W/ INTRAOCULAR LENS  IMPLANT, BILATERAL  ~ 03-17-2009   bilateral   TONSILLECTOMY AND ADENOIDECTOMY  1950's   VEIN LIGATION AND STRIPPING  2012   bilaterally   Social History   Social History Narrative   Widowed 17-Oct-2013. Daughter died 17-Mar-2012. 2 living children. 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      Hobbies: gardening, time with dog, time with grandkids   Immunization History  Administered Date(s) Administered   Fluad Quad(high Dose 65+) 09/25/2019, 10/07/2020   Influenza, High Dose Seasonal PF 08/24/2016, 09/28/2018   Influenza-Unspecified 11/12/2015, 09/25/2019   PFIZER Comirnaty(Gray Top)Covid-19 Tri-Sucrose Vaccine 02/17/2021   PFIZER(Purple Top)SARS-COV-2 Vaccination 01/21/2020, 02/11/2020, 08/19/2020   PPD Test 05/06/2021   Pneumococcal Conjugate-13 08/24/2016   Pneumococcal Polysaccharide-23 12/29/1999   Pneumococcal-Unspecified 08/27/2010   Td 12/28/2005, 10/26/2016     Objective: Vital Signs: BP (!) 114/55 (BP Location: Left Arm, Patient Position: Sitting, Cuff Size: Normal)   Pulse 60   Ht '5\' 4"'$  (1.626 m)   Wt 196 lb 9.6 oz (89.2 kg)   BMI 33.75 kg/m    Physical Exam Vitals and nursing note reviewed.  Constitutional:      Appearance: She is well-developed.  HENT:     Head: Normocephalic and atraumatic.  Eyes:     Conjunctiva/sclera: Conjunctivae normal.  Cardiovascular:     Rate and Rhythm: Normal rate and regular rhythm.     Heart sounds: Normal heart sounds.  Pulmonary:     Effort: Pulmonary effort is normal.     Breath sounds: Normal breath sounds.  Abdominal:     General: Bowel sounds are normal.     Palpations: Abdomen is soft.  Musculoskeletal:     Cervical back: Normal range of motion.  Lymphadenopathy:     Cervical: No cervical  adenopathy.  Skin:    General: Skin is warm and dry.     Capillary Refill: Capillary refill takes less than 2 seconds.  Neurological:     Mental Status: She is alert and oriented to person, place, and time.  Psychiatric:        Behavior: Behavior normal.     Musculoskeletal Exam: C-spine was in good range of motion.  Shoulder joints, elbow joints, wrist joints with good range of motion.  She had bilateral CMC PIP and DIP thickening.  She had tenderness on palpation of the right CMC joint.  No swelling was noted.  Hip joints and knee joints with good range of motion.  Osteoarthritis in bilateral feet with DIP and PIP thickening and subluxation of the right second toe  was noted.  CDAI Exam: CDAI Score: -- Patient Global: --; Provider Global: -- Swollen: --; Tender: -- Joint Exam 08/12/2021   No joint exam has been documented for this visit   There is currently no information documented on the homunculus. Go to the Rheumatology activity and complete the homunculus joint exam.  Investigation: No additional findings.  Imaging: No results found.  Recent Labs: Lab Results  Component Value Date   WBC 5.3 05/08/2021   HGB 12.7 05/08/2021   PLT 240.0 05/08/2021   NA 142 05/08/2021   K 4.0 05/08/2021   CL 106 05/08/2021   CO2 28 05/08/2021   GLUCOSE 85 05/08/2021   BUN 20 05/08/2021   CREATININE 0.79 05/08/2021   BILITOT 0.8 05/08/2021   ALKPHOS 70 05/08/2021   AST 23 05/08/2021   ALT 15 05/08/2021   PROT 6.9 05/08/2021   ALBUMIN 4.1 05/08/2021   CALCIUM 9.0 05/08/2021   GFRAA >60 04/21/2020    Speciality Comments: No specialty comments available.  Ultrasound guided injection is preferred based studies that show increased duration, increased effect, greater accuracy, decreased procedural pain, increased response rate, and decreased cost with ultrasound guided versus blind injection.   Verbal informed consent obtained.  Time-out conducted.  Noted no overlying erythema,  induration, or other signs of local infection. Ultrasound-guided CMC injection:After sterile prep with Betadine, injected 0.5 mL of 1% lidocaine and 10 mg Kenalog using a 27-gauge needle, needle placement was confirmed in the [facility] joint.  Procedures:  Small Joint Inj: R thumb CMC on 08/12/2021 1:47 PM Indications: pain Details: 27 G needle, ultrasound-guided radial approach  Spinal Needle: No  Medications: 10 mg triamcinolone acetonide 40 MG/ML; 0.5 mL lidocaine 1 % Aspirate: 0 mL Outcome: tolerated well, no immediate complications Procedure, treatment alternatives, risks and benefits explained, specific risks discussed. Consent was given by the patient. Immediately prior to procedure a time out was called to verify the correct patient, procedure, equipment, support staff and site/side marked as required. Patient was prepped and draped in the usual sterile fashion.    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  She has been having increased pain and discomfort in her right CMC joint.  She requested a cortisone injection.  After informed consent was obtained and side effects were discussed right CMC joint was injected with cortisone as described above.  She tolerated the procedure well.  Postprocedure instructions were given.  Primary osteoarthritis of both hands-DIP and PIP thickening was noted.  Joint protection muscle strengthening was discussed.  Handout on hand exercises was given.  She has a CMC brace.  She was advised to use it.  Primary osteoarthritis of both knees-she denies any discomfort currently.  Primary osteoarthritis of both feet-she has overcrowding of the toes.  Proper fitting shoes were discussed.  Rheumatoid factor positive-no synovitis was noted.  Other medical problems are listed as  follows  Primary insomnia  History of hypertension  History of hyperlipidemia  History of coronary artery disease  History of gastroesophageal reflux (GERD)  History of adenomatous polyp of colon  History of depression  Screening for osteoporosis-DEXA scan on May 20, 2021 showed T score of -0.4 in the lumbar region.  Orders: Orders Placed This Encounter  Procedures   Small Joint Inj    No orders of the defined types were placed in this encounter.  Follow-Up Instructions: Return in about 9 months (around 05/12/2022) for Osteoarthritis.   Bo Merino, MD  Note - This record has been created using Editor, commissioning.  Chart creation errors have been sought, but may not always  have been located. Such creation errors do not reflect on  the standard of medical care.

## 2021-08-04 ENCOUNTER — Other Ambulatory Visit: Payer: Self-pay | Admitting: Family Medicine

## 2021-08-04 DIAGNOSIS — H6121 Impacted cerumen, right ear: Secondary | ICD-10-CM | POA: Insufficient documentation

## 2021-08-04 DIAGNOSIS — H9193 Unspecified hearing loss, bilateral: Secondary | ICD-10-CM | POA: Insufficient documentation

## 2021-08-04 DIAGNOSIS — H938X1 Other specified disorders of right ear: Secondary | ICD-10-CM | POA: Diagnosis not present

## 2021-08-12 ENCOUNTER — Ambulatory Visit: Payer: Self-pay

## 2021-08-12 ENCOUNTER — Ambulatory Visit: Payer: Medicare HMO | Admitting: Rheumatology

## 2021-08-12 ENCOUNTER — Encounter: Payer: Self-pay | Admitting: Rheumatology

## 2021-08-12 ENCOUNTER — Other Ambulatory Visit: Payer: Self-pay

## 2021-08-12 VITALS — BP 114/55 | HR 60 | Ht 64.0 in | Wt 196.6 lb

## 2021-08-12 DIAGNOSIS — M170 Bilateral primary osteoarthritis of knee: Secondary | ICD-10-CM | POA: Diagnosis not present

## 2021-08-12 DIAGNOSIS — M79644 Pain in right finger(s): Secondary | ICD-10-CM

## 2021-08-12 DIAGNOSIS — M19072 Primary osteoarthritis, left ankle and foot: Secondary | ICD-10-CM

## 2021-08-12 DIAGNOSIS — Z8639 Personal history of other endocrine, nutritional and metabolic disease: Secondary | ICD-10-CM | POA: Diagnosis not present

## 2021-08-12 DIAGNOSIS — R768 Other specified abnormal immunological findings in serum: Secondary | ICD-10-CM | POA: Diagnosis not present

## 2021-08-12 DIAGNOSIS — Z8719 Personal history of other diseases of the digestive system: Secondary | ICD-10-CM | POA: Diagnosis not present

## 2021-08-12 DIAGNOSIS — H903 Sensorineural hearing loss, bilateral: Secondary | ICD-10-CM | POA: Diagnosis not present

## 2021-08-12 DIAGNOSIS — Z86010 Personal history of colonic polyps: Secondary | ICD-10-CM | POA: Diagnosis not present

## 2021-08-12 DIAGNOSIS — M19042 Primary osteoarthritis, left hand: Secondary | ICD-10-CM

## 2021-08-12 DIAGNOSIS — M180 Bilateral primary osteoarthritis of first carpometacarpal joints: Secondary | ICD-10-CM

## 2021-08-12 DIAGNOSIS — Z13820 Encounter for screening for osteoporosis: Secondary | ICD-10-CM

## 2021-08-12 DIAGNOSIS — F5101 Primary insomnia: Secondary | ICD-10-CM

## 2021-08-12 DIAGNOSIS — Z8659 Personal history of other mental and behavioral disorders: Secondary | ICD-10-CM

## 2021-08-12 DIAGNOSIS — M19041 Primary osteoarthritis, right hand: Secondary | ICD-10-CM | POA: Diagnosis not present

## 2021-08-12 DIAGNOSIS — R69 Illness, unspecified: Secondary | ICD-10-CM | POA: Diagnosis not present

## 2021-08-12 DIAGNOSIS — Z8679 Personal history of other diseases of the circulatory system: Secondary | ICD-10-CM | POA: Diagnosis not present

## 2021-08-12 DIAGNOSIS — M19071 Primary osteoarthritis, right ankle and foot: Secondary | ICD-10-CM | POA: Diagnosis not present

## 2021-08-12 MED ORDER — LIDOCAINE HCL 1 % IJ SOLN
0.5000 mL | INTRAMUSCULAR | Status: AC | PRN
Start: 2021-08-12 — End: 2021-08-12
  Administered 2021-08-12: .5 mL

## 2021-08-12 MED ORDER — TRIAMCINOLONE ACETONIDE 40 MG/ML IJ SUSP
10.0000 mg | INTRAMUSCULAR | Status: AC | PRN
  Administered 2021-08-12: 10 mg via INTRA_ARTICULAR

## 2021-08-12 NOTE — Patient Instructions (Signed)
Hand Exercises Hand exercises can be helpful for almost anyone. These exercises can strengthen the hands, improve flexibility and movement, and increase blood flow to the hands. These results can make work and daily tasks easier. Hand exercises can be especially helpful for people who have joint pain from arthritis or have nerve damage from overuse (carpal tunnel syndrome). These exercises can also help people who have injured a hand. Exercises Most of these hand exercises are gentle stretching and motion exercises. It is usually safe to do them often throughout the day. Warming up your hands before exercise may help to reduce stiffness. You can do this with gentle massage orby placing your hands in warm water for 10-15 minutes. It is normal to feel some stretching, pulling, tightness, or mild discomfort as you begin new exercises. This will gradually improve. Stop an exercise right away if you feel sudden, severe pain or your pain gets worse. Ask your healthcare provider which exercises are best for you. Knuckle bend or "claw" fist Stand or sit with your arm, hand, and all five fingers pointed straight up. Make sure to keep your wrist straight during the exercise. Gently bend your fingers down toward your palm until the tips of your fingers are touching the top of your palm. Keep your big knuckle straight and just bend the small knuckles in your fingers. Hold this position for __________ seconds. Straighten (extend) your fingers back to the starting position. Repeat this exercise 5-10 times with each hand. Full finger fist Stand or sit with your arm, hand, and all five fingers pointed straight up. Make sure to keep your wrist straight during the exercise. Gently bend your fingers into your palm until the tips of your fingers are touching the middle of your palm. Hold this position for __________ seconds. Extend your fingers back to the starting position, stretching every joint fully. Repeat this  exercise 5-10 times with each hand. Straight fist Stand or sit with your arm, hand, and all five fingers pointed straight up. Make sure to keep your wrist straight during the exercise. Gently bend your fingers at the big knuckle, where your fingers meet your hand, and the middle knuckle. Keep the knuckle at the tips of your fingers straight and try to touch the bottom of your palm. Hold this position for __________ seconds. Extend your fingers back to the starting position, stretching every joint fully. Repeat this exercise 5-10 times with each hand. Tabletop Stand or sit with your arm, hand, and all five fingers pointed straight up. Make sure to keep your wrist straight during the exercise. Gently bend your fingers at the big knuckle, where your fingers meet your hand, as far down as you can while keeping the small knuckles in your fingers straight. Think of forming a tabletop with your fingers. Hold this position for __________ seconds. Extend your fingers back to the starting position, stretching every joint fully. Repeat this exercise 5-10 times with each hand. Finger spread Place your hand flat on a table with your palm facing down. Make sure your wrist stays straight as you do this exercise. Spread your fingers and thumb apart from each other as far as you can until you feel a gentle stretch. Hold this position for __________ seconds. Bring your fingers and thumb tight together again. Hold this position for __________ seconds. Repeat this exercise 5-10 times with each hand. Making circles Stand or sit with your arm, hand, and all five fingers pointed straight up. Make sure to keep your   wrist straight during the exercise. Make a circle by touching the tip of your thumb to the tip of your index finger. Hold for __________ seconds. Then open your hand wide. Repeat this motion with your thumb and each finger on your hand. Repeat this exercise 5-10 times with each hand. Thumb motion Sit  with your forearm resting on a table and your wrist straight. Your thumb should be facing up toward the ceiling. Keep your fingers relaxed as you move your thumb. Lift your thumb up as high as you can toward the ceiling. Hold for __________ seconds. Bend your thumb across your palm as far as you can, reaching the tip of your thumb for the small finger (pinkie) side of your palm. Hold for __________ seconds. Repeat this exercise 5-10 times with each hand. Grip strengthening  Hold a stress ball or other soft ball in the middle of your hand. Slowly increase the pressure, squeezing the ball as much as you can without causing pain. Think of bringing the tips of your fingers into the middle of your palm. All of your finger joints should bend when doing this exercise. Hold your squeeze for __________ seconds, then relax. Repeat this exercise 5-10 times with each hand. Contact a health care provider if: Your hand pain or discomfort gets much worse when you do an exercise. Your hand pain or discomfort does not improve within 2 hours after you exercise. If you have any of these problems, stop doing these exercises right away. Do not do them again unless your health care provider says that you can. Get help right away if: You develop sudden, severe hand pain or swelling. If this happens, stop doing these exercises right away. Do not do them again unless your health care provider says that you can. This information is not intended to replace advice given to you by your health care provider. Make sure you discuss any questions you have with your healthcare provider. Document Revised: 04/05/2019 Document Reviewed: 12/14/2018 Elsevier Patient Education  2022 [employer]vier Inc.  

## 2021-08-17 DIAGNOSIS — H903 Sensorineural hearing loss, bilateral: Secondary | ICD-10-CM | POA: Diagnosis not present

## 2021-08-18 ENCOUNTER — Encounter: Payer: Self-pay | Admitting: Family Medicine

## 2021-08-19 ENCOUNTER — Ambulatory Visit: Payer: Medicare HMO | Admitting: Physician Assistant

## 2021-08-21 ENCOUNTER — Ambulatory Visit: Payer: Medicare HMO | Admitting: Nurse Practitioner

## 2021-08-29 ENCOUNTER — Other Ambulatory Visit: Payer: Self-pay | Admitting: Family Medicine

## 2021-09-07 ENCOUNTER — Ambulatory Visit: Payer: Medicare HMO | Admitting: Physician Assistant

## 2021-09-07 ENCOUNTER — Other Ambulatory Visit: Payer: Self-pay

## 2021-09-07 VITALS — BP 122/66 | HR 55 | Ht 65.0 in | Wt 193.8 lb

## 2021-09-07 DIAGNOSIS — I6523 Occlusion and stenosis of bilateral carotid arteries: Secondary | ICD-10-CM | POA: Diagnosis not present

## 2021-09-07 DIAGNOSIS — I2510 Atherosclerotic heart disease of native coronary artery without angina pectoris: Secondary | ICD-10-CM | POA: Diagnosis not present

## 2021-09-07 DIAGNOSIS — Z87898 Personal history of other specified conditions: Secondary | ICD-10-CM

## 2021-09-07 DIAGNOSIS — I10 Essential (primary) hypertension: Secondary | ICD-10-CM | POA: Diagnosis not present

## 2021-09-07 DIAGNOSIS — E785 Hyperlipidemia, unspecified: Secondary | ICD-10-CM | POA: Diagnosis not present

## 2021-09-07 NOTE — Patient Instructions (Addendum)
Medication Instructions:  Your physician recommends that you continue on your current medications as directed. Please refer to the Current Medication list given to you today.  *If you need a refill on your cardiac medications before your next appointment, please call your pharmacy*  Lab Work: NONE ordered at this time of appointment   If you have labs (blood work) drawn today and your tests are completely normal, you will receive your results only by: [facility] (if you have MyChart) OR A paper copy in the mail If you have any lab test that is abnormal or we need to change your treatment, we will call you to review the results.  Testing/Procedures: Your physician has requested that you have a carotid duplex. This test is an ultrasound of the carotid arteries in your neck. It looks at blood flow through these arteries that supply the brain with blood. Allow one hour for this exam. There are no restrictions or special instructions.  Please schedule for 1-2 weeks   Follow-Up: At [facility], you and your health needs are our priority.  As part of our continuing mission to provide you with exceptional heart care, we have created designated Provider Care Teams.  These Care Teams include your primary Cardiologist (physician) and Advanced Practice Providers (APPs -  Physician Assistants and Nurse Practitioners) who all work together to provide you with the care you need, when you need it.  Your next appointment:   1 year(s)  The format for your next appointment:   In Person  Provider:   Kirk Ruths, MD  Other Instructions

## 2021-09-07 NOTE — Progress Notes (Signed)
Cardiology Office Note:    Date:  09/09/2021   ID:  [name], DOB [birth year], MRN [id number]  PCP:  Marin Olp, MD   [facility] Providers Cardiologist:  Kirk Ruths, MD     Referring MD: Marin Olp, MD   Chief Complaint  Patient presents with   Follow-up    Seen for Dr. Stanford Breed    History of Present Illness:    [name] is a 80 y.o. female with a hx of CAD, hyperlipidemia, hypertension and a history of syncope.  Cardiac catheterization in 2006 showed a 30% LAD, 30% left circumflex and irregularities in the RCA.  EF 60%.  Abdominal ultrasound showed no aneurysm in October 2007.  Carotid Doppler in October 2007 showed 0 to 39% bilateral internal carotid artery stenosis disease.  ABI in June 2012 was normal.  A stress echo in June 2012 was normal as well.  Echocardiogram in September 2013 showed normal EF.  Carotid Doppler in September 2013 showed no significant disease.  Heart monitor in August 2018 showed sinus rhythm with rare PACs.  She has chronic chest pain for which she underwent cardiac catheterization in 2006.  Patient was last seen by Dr. Stanford Breed in September 2020 at which time her chest pain is still intermittent.  Myoview obtained on 10/02/2019 showed EF 72%, low risk study, normal perfusion.  Lower extremity ABI obtained in March 2022 was normal.  Lipid panel obtained in May 2022 was very well controlled.  Total cholesterol 144, HDL 55.8, LDL 69, triglyceride 99.  Patient presents today for follow-up.  She recently went to her dentist office.  On a dental x-ray, she apparently noted to have carotid calcification and was sent to cardiology service for further evaluation.  Patient herself however denies any symptom of dizziness, blurred vision or feeling of passing out.  She denies any recent chest pain or shortness of breath recently as well.  On physical exam, she does not have any carotid bruit.  I will evaluate further using a carotid  Doppler.    Past Medical History:  Diagnosis Date   Anginal pain ([place]) 08/29/2012   "have had problems w/this"   Anxiety 08/29/2012   Arthritis 08/29/2012   right hand, Seen by Dr. Estanislado Pandy   CAD (coronary artery disease)    nonobstructive   Depression    Exertional dyspnea 08/29/2012   GERD (gastroesophageal reflux disease)    Hammer toe    surgery x2, second with Dr. Sharol Given- only other option amputatoin   HLD (hyperlipidemia)    HTN (hypertension)    PNEUMONIA 07/04/2007   Qualifier: Diagnosis of  By: Larose Kells MD, [place]    Syncope and collapse 08/29/2012   "first time ever"    Past Surgical History:  Procedure Laterality Date   ABDOMINAL HYSTERECTOMY  1970's   APPENDECTOMY  ~ 1970   CATARACT EXTRACTION W/ INTRAOCULAR LENS  IMPLANT, BILATERAL  ~ 2010   bilateral   TONSILLECTOMY AND ADENOIDECTOMY  1950's   VEIN LIGATION AND STRIPPING  2012   bilaterally    Current Medications: No outpatient medications have been marked as taking for the 09/07/21 encounter (Office Visit) with Almyra Deforest, PA.     Allergies:   Clindamycin/lincomycin and Trazodone and nefazodone   Social History   Socioeconomic History   Marital status: Widowed    Spouse name: Not on file   Number of children: 3   Years of education: Not on file   Highest education level:  Not on file  Occupational History   Occupation: retired  Tobacco Use   Smoking status: Former    Packs/day: 1.00    Years: 20.00    Pack years: 20.00    Types: Cigarettes    Quit date: 12/27/1978    Years since quitting: 42.7   Smokeless tobacco: Never   Tobacco comments:    teenager  Vaping Use   Vaping Use: Never used  Substance and Sexual Activity   Alcohol use: No   Drug use: No   Sexual activity: Never  Other Topics Concern   Not on file  Social History Narrative   Widowed 10/24/13. Daughter died March 24, 2012. 2 living children. 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      Hobbies: gardening, time with dog, time with grandkids   Social Determinants of Health   Financial Resource Strain: Not on file  Food Insecurity: Not on file  Transportation Needs: Not on file  Physical Activity: Not on file  Stress: Not on file  Social Connections: Not on file     Family History: The patient's family history includes Alcoholism in her brother; Colon cancer in her brother; Coronary artery disease in her brother; Diabetes in her mother; Heart disease in her mother; Ovarian cancer in her daughter; Stroke in her brother; Suicidality in her father. There is no history of Esophageal cancer, Rectal cancer, Stomach cancer, or Liver cancer.  ROS:   Please see the history of present illness.     All other systems reviewed and are negative.  EKGs/Labs/Other Studies Reviewed:    The following studies were reviewed today:  Myoview 10/02/2019 Nuclear stress EF: 72%. There was no ST segment deviation noted during stress. The study is normal. This is a low risk study. The left ventricular ejection fraction is hyperdynamic (>65%).   Normal myocardial perfusion imaging study.  Normal left ventricular ejection fraction, and normal wall motion noted.  This is a low risk study.   EKG:  EKG is ordered today.  The ekg ordered today demonstrates sinus bradycardia, heart rate 55 bpm.  Recent Labs: 05/08/2021: ALT 15; BUN 20; Creatinine, Ser 0.79; Hemoglobin 12.7; Platelets 240.0; Potassium 4.0; Sodium 142  Recent Lipid Panel    Component Value Date/Time   CHOL 144 05/08/2021 1412   TRIG 99.0 05/08/2021 1412   TRIG 121 10/09/2009 0000   HDL 55.80 05/08/2021 1412   CHOLHDL 3 05/08/2021 1412   VLDL 19.8 05/08/2021 1412   LDLCALC 69 05/08/2021 1412   LDLDIRECT 65.0 05/30/2020 1209     Risk Assessment/Calculations:           Physical Exam:    VS:  BP 122/66   Pulse (!) 55   Ht '5\' 5"'$  (1.651  m)   Wt 193 lb 12.8 oz (87.9 kg)   BMI 32.25 kg/m     Wt Readings from Last 3 Encounters:  09/07/21 193 lb 12.8 oz (87.9 kg)  08/12/21 196 lb 9.6 oz (89.2 kg)  06/08/21 201 lb (91.2 kg)     GEN:  Well nourished, well developed in no acute distress HEENT: Normal NECK: No JVD; No carotid bruits LYMPHATICS: No lymphadenopathy CARDIAC: RRR, no murmurs, rubs, gallops RESPIRATORY:  Clear to auscultation without rales, wheezing or rhonchi  ABDOMEN: Soft, non-tender, non-distended MUSCULOSKELETAL:  No edema;  No deformity  SKIN: Warm and dry NEUROLOGIC:  Alert and oriented x 3 PSYCHIATRIC:  Normal affect   ASSESSMENT:    1. Coronary artery disease involving native coronary artery of native heart without angina pectoris   2. Bilateral carotid artery stenosis   3. Essential hypertension   4. Hyperlipidemia LDL goal <70   5. History of syncope    PLAN:    In order of problems listed above:  CAD: Denies any recent chest pain.  Last Myoview in 2020.  Continue on aspirin and Lipitor  Carotid artery disease: Recently, she was seen at her dentist office, according to image obtained at the dentist office, carotid artery calcification was noted.  I will obtain a carotid artery Doppler.  Currently she is asymptomatic without any dizziness or feeling of passing out.  She does not have any carotid bruit on physical exam.  Hypertension: Blood pressure stable on current therapy  Hyperlipidemia: On Lipitor 80 mg daily  History of syncope: No recent recurrence.        Medication Adjustments/Labs and Tests Ordered: Current medicines are reviewed at length with the patient today.  Concerns regarding medicines are outlined above.  Orders Placed This Encounter  Procedures   EKG 12-Lead   VAS US CAROTID   No orders of the defined types were placed in this encounter.   Patient Instructions  Medication Instructions:  Your physician recommends that you continue on your current medications  as directed. Please refer to the Current Medication list given to you today.  *If you need a refill on your cardiac medications before your next appointment, please call your pharmacy*  Lab Work: NONE ordered at this time of appointment   If you have labs (blood work) drawn today and your tests are completely normal, you will receive your results only by: [place] (if you have MyChart) OR A paper copy in the mail If you have any lab test that is abnormal or we need to change your treatment, we will call you to review the results.  Testing/Procedures: Your physician has requested that you have a carotid duplex. This test is an ultrasound of the carotid arteries in your neck. It looks at blood flow through these arteries that supply the brain with blood. Allow one hour for this exam. There are no restrictions or special instructions.  Please schedule for 1-2 weeks   Follow-Up: At [facility], you and your health needs are our priority.  As part of our continuing mission to provide you with exceptional heart care, we have created designated Provider Care Teams.  These Care Teams include your primary Cardiologist (physician) and Advanced Practice Providers (APPs -  Physician Assistants and Nurse Practitioners) who all work together to provide you with the care you need, when you need it.  Your next appointment:   1 year(s)  The format for your next appointment:   In Person  Provider:   Kirk Ruths, MD  Other Instructions    Signed, Almyra Deforest, [place]  09/09/2021 6:50 PM    [place]

## 2021-09-09 ENCOUNTER — Encounter: Payer: Self-pay | Admitting: Physician Assistant

## 2021-09-17 ENCOUNTER — Other Ambulatory Visit: Payer: Self-pay

## 2021-09-17 ENCOUNTER — Ambulatory Visit (HOSPITAL_COMMUNITY)
Admission: RE | Admit: 2021-09-17 | Discharge: 2021-09-17 | Disposition: A | Discharge status: Home / Self Care | Payer: Medicare HMO | Source: Ambulatory Visit | Attending: Cardiology | Admitting: Cardiology

## 2021-09-17 DIAGNOSIS — I6523 Occlusion and stenosis of bilateral carotid arteries: Secondary | ICD-10-CM | POA: Insufficient documentation

## 2021-10-01 DIAGNOSIS — M545 Low back pain, unspecified: Secondary | ICD-10-CM | POA: Diagnosis not present

## 2021-10-06 ENCOUNTER — Other Ambulatory Visit: Payer: Self-pay

## 2021-10-06 DIAGNOSIS — I6523 Occlusion and stenosis of bilateral carotid arteries: Secondary | ICD-10-CM

## 2021-11-10 ENCOUNTER — Encounter: Payer: Self-pay | Admitting: Family Medicine

## 2021-11-10 ENCOUNTER — Ambulatory Visit (INDEPENDENT_AMBULATORY_CARE_PROVIDER_SITE_OTHER): Payer: Medicare HMO | Admitting: Family Medicine

## 2021-11-10 ENCOUNTER — Other Ambulatory Visit: Payer: Self-pay

## 2021-11-10 VITALS — BP 110/60 | HR 52 | Temp 97.8°F | Ht 65.0 in | Wt 195.8 lb

## 2021-11-10 DIAGNOSIS — I2510 Atherosclerotic heart disease of native coronary artery without angina pectoris: Secondary | ICD-10-CM

## 2021-11-10 DIAGNOSIS — I10 Essential (primary) hypertension: Secondary | ICD-10-CM

## 2021-11-10 DIAGNOSIS — F325 Major depressive disorder, single episode, in full remission: Secondary | ICD-10-CM

## 2021-11-10 DIAGNOSIS — R739 Hyperglycemia, unspecified: Secondary | ICD-10-CM

## 2021-11-10 DIAGNOSIS — R69 Illness, unspecified: Secondary | ICD-10-CM | POA: Diagnosis not present

## 2021-11-10 DIAGNOSIS — E7800 Pure hypercholesterolemia, unspecified: Secondary | ICD-10-CM

## 2021-11-10 DIAGNOSIS — K219 Gastro-esophageal reflux disease without esophagitis: Secondary | ICD-10-CM | POA: Diagnosis not present

## 2021-11-10 LAB — CBC WITH DIFFERENTIAL/PLATELET
Basophils Absolute: 0 10*3/uL (ref 0.0–0.1)
Basophils Relative: 0.4 % (ref 0.0–3.0)
Eosinophils Absolute: 0.1 10*3/uL (ref 0.0–0.7)
Eosinophils Relative: 1.7 % (ref 0.0–5.0)
HCT: 36.3 % (ref 36.0–46.0)
Hemoglobin: 12.3 g/dL (ref 12.0–15.0)
Lymphocytes Relative: 25.7 % (ref 12.0–46.0)
Lymphs Abs: 1.6 10*3/uL (ref 0.7–4.0)
MCHC: 33.9 g/dL (ref 30.0–36.0)
MCV: 88.4 fl (ref 78.0–100.0)
Monocytes Absolute: 0.4 10*3/uL (ref 0.1–1.0)
Monocytes Relative: 5.8 % (ref 3.0–12.0)
Neutro Abs: 4.2 10*3/uL (ref 1.4–7.7)
Neutrophils Relative %: 66.4 % (ref 43.0–77.0)
Platelets: 235 10*3/uL (ref 150.0–400.0)
RBC: 4.11 Mil/uL (ref 3.87–5.11)
RDW: 14.2 % (ref 11.5–15.5)
WBC: 6.3 10*3/uL (ref 4.0–10.5)

## 2021-11-10 LAB — HEMOGLOBIN A1C: Hgb A1c MFr Bld: 5.4 % (ref 4.6–6.5)

## 2021-11-10 LAB — COMPREHENSIVE METABOLIC PANEL
ALT: 16 U/L (ref 0–35)
AST: 22 U/L (ref 0–37)
Albumin: 3.9 g/dL (ref 3.5–5.2)
Alkaline Phosphatase: 63 U/L (ref 39–117)
BUN: 20 mg/dL (ref 6–23)
CO2: 28 mEq/L (ref 19–32)
Calcium: 9 mg/dL (ref 8.4–10.5)
Chloride: 105 mEq/L (ref 96–112)
Creatinine, Ser: 0.69 mg/dL (ref 0.40–1.20)
GFR: 82.33 mL/min (ref >60.00)
Glucose, Bld: 102 mg/dL — ABNORMAL HIGH (ref 70–99)
Potassium: 3.8 mEq/L (ref 3.5–5.1)
Sodium: 140 mEq/L (ref 135–145)
Total Bilirubin: 0.9 mg/dL (ref 0.2–1.2)
Total Protein: 6.5 g/dL (ref 6.0–8.3)

## 2021-11-10 LAB — TSH: TSH: 2.48 u[IU]/mL (ref 0.35–5.50)

## 2021-11-10 MED ORDER — AMLODIPINE BESYLATE 2.5 MG PO TABS: 2.5000 mg | ORAL_TABLET | Freq: Every day | ORAL | 3 refills | Status: DC

## 2021-11-10 MED ORDER — ESCITALOPRAM OXALATE 20 MG PO TABS: 20.0000 mg | ORAL_TABLET | Freq: Every day | ORAL | 3 refills | Status: DC

## 2021-11-10 MED ORDER — METOPROLOL SUCCINATE ER 25 MG PO TB24: 25.0000 mg | ORAL_TABLET | Freq: Every day | ORAL | 3 refills | Status: DC

## 2021-11-10 MED ORDER — OMEPRAZOLE 20 MG PO CPDR: 20.0000 mg | DELAYED_RELEASE_CAPSULE | Freq: Two times a day (BID) | ORAL | 3 refills | Status: DC

## 2021-11-10 MED ORDER — ATORVASTATIN CALCIUM 80 MG PO TABS
80.0000 mg | ORAL_TABLET | Freq: Every evening | ORAL | 3 refills | Status: DC
Start: 2021-11-10 — End: 2022-11-03

## 2021-11-10 NOTE — Progress Notes (Signed)
Phone [phone] In person visit   Subjective:   [name] is a 80 y.o. year old very pleasant female patient who presents for/with See problem oriented charting Chief Complaint  Patient presents with   Follow-up   Hypertension    This visit occurred during the SARS-CoV-2 public health emergency.  Safety protocols were in place, including screening questions prior to the visit, additional usage of staff PPE, and extensive cleaning of exam room while observing appropriate contact time as indicated for disinfecting solutions.   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[place]) 05/19/2007    Priority: Medium    Essential hypertension 05/19/2007    Priority: Medium    Insomnia 12/01/2015    Priority: Low   Glaucoma 12/01/2015    Priority: Low   Hammer toe     Priority: Low   Arthritis 08/29/2012    Priority: Low   GERD 09/06/2009    Priority: Low   Edema 09/06/2009    Priority: Low   History of syncope 09/06/2009    Priority: Low   Hyperglycemia 05/19/2007    Priority: Low   Diaphoresis 05/12/2018   Trigger finger, right middle finger 09/05/2017   Primary osteoarthritis of both knees 03/10/2017   Primary osteoarthritis of both hands 03/09/2017   Primary osteoarthritis of both feet 03/09/2017   Rheumatoid factor positive 03/09/2017    Medications- reviewed and updated Current Outpatient Medications  Medication Sig Dispense Refill   aspirin 81 MG tablet Take 81 mg by mouth daily.      Cholecalciferol  (VITAMIN D3) 50 MCG (2000 UT) TABS Take 1 tablet by mouth daily.     diclofenac sodium (VOLTAREN) 1 % GEL Apply 3 gm to 3 large joints up to 3 times a day.Dispense 3 tubes with 3 refills. 3 Tube 3   vitamin B-12 (CYANOCOBALAMIN) 1000 MCG tablet Take 1,000 mcg by mouth daily.     amLODipine (NORVASC) 2.5 MG tablet Take 1 tablet (2.5 mg total) by mouth daily. 90 tablet 3   atorvastatin (LIPITOR) 80 MG tablet Take 1 tablet (80 mg total) by mouth every evening. 90 tablet 3   escitalopram (LEXAPRO) 20 MG tablet Take 1 tablet (20 mg total) by mouth daily. 90 tablet 3   metoprolol succinate (TOPROL-XL) 25 MG 24 hr tablet Take 1 tablet (25 mg total) by mouth daily. 90 tablet 3   omeprazole (PRILOSEC) 20 MG capsule Take 1 capsule (20 mg total) by mouth in the morning and at bedtime. 180 capsule 3   No current facility-administered medications for this visit.     Objective:  BP 110/60   Pulse (!) 52   Temp 97.8 F (36.6 C)   Ht 5\' 5"  (1.651 m)   Wt 195 lb 12.8 oz (88.8 kg)   SpO2 99%   BMI 32.58 kg/m  Gen: NAD, resting comfortably CV: RRR no murmurs rubs or gallops Lungs: CTAB no crackles, wheeze, rhonchi Ext: trace edema Skin: warm, dry  Assessment and Plan   #social update- paying for a big beach house for family including all siblings! Next June- they are willing to fly in and she think sthis is only way to get everyone together.   ##nonobstructive CAD - no chest pain or shortness of breath #hyperlipidemia with LDL goal under 70 S: Medication:Atorvastatin 80 mg daily and Aspirin 81 mg daily  - Patient was seen in Cardiology by Almyra Deforest, PA on 09/07/2021 -  patient was referred via her dentist due to a dental x-ray noting that she had carotid calcification. EKG was ordered during the encounter and demonstrated sinus brachycardia with a heart rate of 55 bpm. Patient was asymptomatic at the time and it was planned for a carotid artery Doppler to be obtained- see discussion below  -  still going to [facility]  [facility] and going to do line dancing -no chest pain or sob Lab Results  Component Value Date   CHOL 144 05/08/2021   HDL 55.80 05/08/2021   LDLCALC 69 05/08/2021   LDLDIRECT 65.0 05/30/2020   TRIG 99.0 05/08/2021   CHOLHDL 3 05/08/2021   A/P: For CAD - asymptomatic-continue current medication with statin and aspirin For HLD - with LDL goal 70 or less-continue atorvastatin 80 mg-recheck follow-up panel next visit -Did have some carotid stenosis 50% and left common carotid and right subclavian artery through cardiology-plan for 1 year repeat from September 2022  #hypertension S: medication: Metoprolol 25 mg XR daily and amlodipine 2.5 mg daily BP Readings from Last 3 Encounters:  11/10/21 110/60  09/07/21 122/66  08/12/21 (!) 114/55  A/P:  Controlled. Continue current medications.    # Hyperglycemia/insulin resistance/prediabetes S:  Medication: none at present - down 3 lbs from last visit Lab Results  Component Value Date   HGBA1C 5.9 05/08/2021   HGBA1C 5.6 08/01/2017   HGBA1C 5.4 12/01/2015  A/P: Hopefully remains out of prediabetes range-update A1c with labs today   # Depression S: Medication:Escitalopram 20 mg Depression screen [facility] 2/9 11/10/2021 05/08/2021 08/25/2020  Decreased Interest 0 0 0  Down, Depressed, Hopeless 0 0 0  PHQ - 2 Score 0 0 0  Altered sleeping 0 3 3  Tired, decreased energy 3 0 0  Change in appetite 0 0 0  Feeling bad or failure about yourself  0 0 0  Trouble concentrating 0 0 0  Moving slowly or fidgety/restless 0 0 0  Suicidal thoughts 0 0 0  PHQ-9 Score 3 3 3   Difficult doing work/chores Not difficult at all Not difficult at all Not difficult at all  Some recent data might be hidden  A/P: full remission- Overall remains well controlled other than does have some fatigue-we will update labs including TSH and CBC along with other labs  # GERD S:Medication: omeprazole 20 mg twice daily.  No cause  of dysphagia found on endoscopy or gerd june 2022 B12 levels related to PPI use: takes b12- do not need to repeat at the moment A/P: occasionally mild breakthrough but EGD reassuring- continue current meds - plus rolaids as needed  #Primary osteoarthritis of the hands-follows with Dr. Estanislado Pandy rheumatology-was seen back on 08/12/2021 where patient reported having increased pain and discomfort in her right CMC joint. Patient requested to have a cortisone injection -which was completed- she reports improvement   Recommended follow up: keep may physical here Future Appointments  Date Time Provider [place]  02/18/2022  2:30 PM Warren Danes, PA-C [facility]  05/10/2022  1:20 PM Marin Olp, MD [facility]  05/12/2022 10:15 AM Bo Merino, MD [facility] None    Lab/Order associations:   ICD-10-CM   1. Essential hypertension  I10 Comprehensive metabolic panel    TSH    CBC with Differential/Platelet    2. Hyperglycemia  R73.9 Hemoglobin A1c    3. Major depressive disorder with single episode, in full remission ([place])  F32.5     4. Coronary artery disease involving native coronary artery of native heart without angina pectoris  I25.10     5. Pure hypercholesterolemia  E78.00 Comprehensive metabolic panel    TSH    CBC with Differential/Platelet    6. Gastroesophageal reflux disease without esophagitis  K21.9 omeprazole (PRILOSEC) 20 MG capsule    7. Black stool  K92.1 omeprazole (PRILOSEC) 20 MG capsule      Meds ordered this encounter  Medications   amLODipine (NORVASC) 2.5 MG tablet    Sig: Take 1 tablet (2.5 mg total) by mouth daily.    Dispense:  90 tablet    Refill:  3   metoprolol succinate (TOPROL-XL) 25 MG 24 hr tablet    Sig: Take 1 tablet (25 mg total) by mouth daily.    Dispense:  90 tablet    Refill:  3   escitalopram (LEXAPRO) 20 MG tablet    Sig: Take 1 tablet (20 mg total) by mouth daily.    Dispense:  90 tablet    Refill:  3    atorvastatin (LIPITOR) 80 MG tablet    Sig: Take 1 tablet (80 mg total) by mouth every evening.    Dispense:  90 tablet    Refill:  3   omeprazole (PRILOSEC) 20 MG capsule    Sig: Take 1 capsule (20 mg total) by mouth in the morning and at bedtime.    Dispense:  180 capsule    Refill:  3    I,Harris Phan,acting as a scribe for Garret Reddish, MD.,have documented all relevant documentation on the behalf of Garret Reddish, MD,as directed by  Garret Reddish, MD while in the presence of Garret Reddish, MD.    I, Garret Reddish, MD, have reviewed all documentation for this visit. The documentation on 11/10/21 for the exam, diagnosis, procedures, and orders are all accurate and complete.   Return precautions advised.  Garret Reddish, MD

## 2021-11-10 NOTE — Patient Instructions (Addendum)
Health Maintenance Due  Topic Date Due   Zoster Vaccines- Shingrix (1 of 2) -Please check with your pharmacy to see if they have the shingrix vaccine. If they do- please get this immunization and update [place] by phone call or mychart with dates you receive the vaccine  Never done   COVID-19 Vaccine (5 - Booster for [employer] series) -consider new bivalent booster at pharmacy (new omicron specific medicine)  04/14/2021   Please stop by lab before you go If you have mychart- we will send your results within 3 business days of [place] receiving them.  If you do not have mychart- we will call you about results within 5 business days of [place] receiving them.  *please also note that you will see labs on mychart as soon as they post. I will later go in and write notes on them- will say "notes from Dr. Yong Channel"  Recommended follow up: Keep your may 2023 visit.

## 2021-11-13 ENCOUNTER — Telehealth: Payer: Self-pay

## 2021-11-13 NOTE — Telephone Encounter (Signed)
Vaccine updated

## 2021-11-13 NOTE — Telephone Encounter (Signed)
Pt called to let us know that she received the 3rd covid Pfizer vaccine.

## 2021-11-17 ENCOUNTER — Telehealth: Payer: Self-pay

## 2021-11-17 NOTE — Telephone Encounter (Signed)
Patient called in stating she received the [place] booster today.

## 2021-11-17 NOTE — Telephone Encounter (Signed)
Spoke to patient, updated chart

## 2021-11-17 NOTE — Telephone Encounter (Signed)
Pt called stating that she received her 5th Covid booster yesterday 11/16/21.

## 2021-11-17 NOTE — Telephone Encounter (Signed)
Patient will call back with accurate dates and which vaccine she received

## 2021-12-01 ENCOUNTER — Encounter: Payer: Self-pay | Admitting: Family Medicine

## 2021-12-08 ENCOUNTER — Other Ambulatory Visit: Payer: Self-pay

## 2021-12-08 ENCOUNTER — Ambulatory Visit: Payer: Self-pay

## 2021-12-08 ENCOUNTER — Encounter: Payer: Self-pay | Admitting: Rheumatology

## 2021-12-08 ENCOUNTER — Ambulatory Visit: Payer: Medicare HMO | Admitting: Rheumatology

## 2021-12-08 VITALS — BP 148/71 | HR 54 | Ht 63.0 in | Wt 199.8 lb

## 2021-12-08 DIAGNOSIS — M19072 Primary osteoarthritis, left ankle and foot: Secondary | ICD-10-CM

## 2021-12-08 DIAGNOSIS — Z8659 Personal history of other mental and behavioral disorders: Secondary | ICD-10-CM

## 2021-12-08 DIAGNOSIS — Z86010 Personal history of colonic polyps: Secondary | ICD-10-CM

## 2021-12-08 DIAGNOSIS — R768 Other specified abnormal immunological findings in serum: Secondary | ICD-10-CM | POA: Diagnosis not present

## 2021-12-08 DIAGNOSIS — Z8719 Personal history of other diseases of the digestive system: Secondary | ICD-10-CM | POA: Diagnosis not present

## 2021-12-08 DIAGNOSIS — M180 Bilateral primary osteoarthritis of first carpometacarpal joints: Secondary | ICD-10-CM | POA: Diagnosis not present

## 2021-12-08 DIAGNOSIS — Z8639 Personal history of other endocrine, nutritional and metabolic disease: Secondary | ICD-10-CM | POA: Diagnosis not present

## 2021-12-08 DIAGNOSIS — M19041 Primary osteoarthritis, right hand: Secondary | ICD-10-CM | POA: Diagnosis not present

## 2021-12-08 DIAGNOSIS — M170 Bilateral primary osteoarthritis of knee: Secondary | ICD-10-CM

## 2021-12-08 DIAGNOSIS — F5101 Primary insomnia: Secondary | ICD-10-CM

## 2021-12-08 DIAGNOSIS — Z13820 Encounter for screening for osteoporosis: Secondary | ICD-10-CM

## 2021-12-08 DIAGNOSIS — Z8679 Personal history of other diseases of the circulatory system: Secondary | ICD-10-CM | POA: Diagnosis not present

## 2021-12-08 DIAGNOSIS — R69 Illness, unspecified: Secondary | ICD-10-CM | POA: Diagnosis not present

## 2021-12-08 DIAGNOSIS — M19071 Primary osteoarthritis, right ankle and foot: Secondary | ICD-10-CM | POA: Diagnosis not present

## 2021-12-08 DIAGNOSIS — M19042 Primary osteoarthritis, left hand: Secondary | ICD-10-CM

## 2021-12-08 MED ORDER — TRIAMCINOLONE ACETONIDE 40 MG/ML IJ SUSP
10.0000 mg | INTRAMUSCULAR | Status: AC | PRN
  Administered 2021-12-08: 10 mg via INTRA_ARTICULAR

## 2021-12-08 MED ORDER — LIDOCAINE HCL 1 % IJ SOLN
0.5000 mL | INTRAMUSCULAR | Status: AC | PRN
Start: 2021-12-08 — End: 2021-12-08
  Administered 2021-12-08: .5 mL

## 2021-12-08 MED ORDER — LIDOCAINE HCL 1 % IJ SOLN
0.5000 mL | INTRAMUSCULAR | Status: AC | PRN
  Administered 2021-12-08: .5 mL

## 2021-12-08 NOTE — Progress Notes (Signed)
Office Visit Note  Patient: [name]             Date of Birth: 12-Mar-1941           MRN: [id number]             PCP: Marin Olp, MD Referring: Marin Olp, MD Visit Date: 12/08/2021 Occupation: @GUAROCC @  Subjective:  Pain in both thumbs   History of Present Illness: [name] is a 80 y.o. female with a history of osteoarthritis.  She states she has been having pain and discomfort in her bilateral CMC joints.  She states the pain has been severe and it is waking her up in the middle of the night.  She will be traveling soon and would like to have her bilateral CMC joint injections.  She had good response to previous injections.  None of the other joints are less painful.  She has not comfort in her knee joints in her feet off and on but doing well currently.  She denies any history of joint swelling.  Activities of Daily Living:  Patient reports morning stiffness for 0 minute.   Patient Reports nocturnal pain.  Difficulty dressing/grooming: Denies Difficulty climbing stairs: Denies Difficulty getting out of chair: Denies Difficulty using hands for taps, buttons, cutlery, and/or writing: Denies  Review of Systems  Constitutional:  Negative for fatigue.  HENT:  Negative for mouth sores, mouth dryness and nose dryness.   Eyes:  Negative for pain, itching and dryness.  Respiratory:  Negative for shortness of breath and difficulty breathing.   Cardiovascular:  Negative for chest pain and palpitations.  Gastrointestinal:  Negative for blood in stool, constipation and diarrhea.  Endocrine: Negative for increased urination.  Genitourinary:  Negative for difficulty urinating.  Musculoskeletal:  Positive for joint pain and joint pain. Negative for joint swelling, myalgias, morning stiffness, muscle tenderness and myalgias.  Skin:  Negative for color change, rash and redness.  Allergic/Immunologic: Negative for susceptible to infections.  Neurological:   Negative for dizziness, numbness, headaches, memory loss and weakness.  Hematological:  Positive for bruising/bleeding tendency.  Psychiatric/Behavioral:  Negative for confusion.    PMFS History:  Patient Active Problem List   Diagnosis Date Noted   Itchy skin 05/30/2020   Diaphoresis 05/12/2018   Trigger finger, right middle finger 09/05/2017   History of adenomatous polyp of colon 08/01/2017   Primary osteoarthritis of both knees 03/10/2017   Primary osteoarthritis of both hands 03/09/2017   Primary osteoarthritis of both feet 03/09/2017   Rheumatoid factor positive 03/09/2017   Advanced directives, counseling/discussion 01/26/2017   nonobstructive CAD (coronary artery disease) 12/01/2015   Varicose veins of both lower extremities 12/01/2015   Insomnia 12/01/2015   Glaucoma 12/01/2015   Memory loss 12/01/2015   Hammer toe    Arthritis 08/29/2012   GERD 09/06/2009   Edema 09/06/2009   History of syncope 09/06/2009   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 Estanislado Pandy   CAD (coronary artery disease)    nonobstructive   Depression    Exertional dyspnea 08/29/2012   GERD (gastroesophageal reflux disease)    Hammer toe    surgery x2, second with Dr. Sharol Given- only other option amputatoin   HLD (  hyperlipidemia)    HTN (hypertension)    PNEUMONIA 07/04/2007   Qualifier: Diagnosis of  By: Larose Kells MD, [place]    Syncope and collapse 08/29/2012   "first time ever"    Family History  Problem Relation Age of Onset   Heart disease Mother        brother, father   Diabetes Mother        sister, brother   Suicidality Father        43   Coronary artery disease Brother    Colon cancer Brother    Stroke Brother    Alcoholism Brother    Ovarian cancer  Daughter    Esophageal cancer Neg Hx    Rectal cancer Neg Hx    Stomach cancer Neg Hx    Liver cancer Neg Hx    Past Surgical History:  Procedure Laterality Date   ABDOMINAL HYSTERECTOMY  1970's   APPENDECTOMY  ~ 1970   CATARACT EXTRACTION W/ INTRAOCULAR LENS  IMPLANT, BILATERAL  ~ 2009/03/12   bilateral   TONSILLECTOMY AND ADENOIDECTOMY  1950's   VEIN LIGATION AND STRIPPING  2012   bilaterally   Social History   Social History Narrative   Widowed 10-12-13. Daughter died 2012/03/12. 2 living children. 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      Hobbies: gardening, time with dog, time with grandkids   Immunization History  Administered Date(s) Administered   Fluad Quad(high Dose 65+) 09/25/2019, 10/07/2020   Influenza, High Dose Seasonal PF 08/24/2016, 09/28/2018   Influenza-Unspecified 11/12/2015, 09/25/2019, 09/03/2021   PFIZER Comirnaty(Gray Top)Covid-19 Tri-Sucrose Vaccine 02/17/2021   PFIZER(Purple Top)SARS-COV-2 Vaccination 01/21/2020, 02/11/2020, 08/19/2020, 11/13/2021   PPD Test 05/06/2021   Pneumococcal Conjugate-13 08/24/2016   Pneumococcal Polysaccharide-23 12/29/1999   Pneumococcal-Unspecified 08/27/2010   Td 12/28/2005, 10/26/2016   Zoster Recombinat (Shingrix) 12/01/2021     Objective: Vital Signs: BP (!) 148/71 (BP Location: Left Arm, Patient Position: Sitting, Cuff Size: Large)   Pulse (!) 54   Ht 5\' 3"  (1.6 m)   Wt 199 lb 12.8 oz (90.6 kg)   BMI 35.39 kg/m    Physical Exam Vitals and nursing note reviewed.  Constitutional:      Appearance: She is well-developed.  HENT:     Head: Normocephalic and atraumatic.  Eyes:     Conjunctiva/sclera: Conjunctivae normal.  Cardiovascular:     Rate and Rhythm: Normal rate and regular rhythm.     Heart sounds: Normal heart sounds.  Pulmonary:     Effort: Pulmonary effort is normal.      Breath sounds: Normal breath sounds.  Abdominal:     General: Bowel sounds are normal.     Palpations: Abdomen is soft.  Musculoskeletal:     Cervical back: Normal range of motion.  Lymphadenopathy:     Cervical: No cervical adenopathy.  Skin:    General: Skin is warm and dry.     Capillary Refill: Capillary refill takes less than 2 seconds.  Neurological:     Mental Status: She is alert and oriented to person, place, and time.  Psychiatric:        Behavior: Behavior normal.     Musculoskeletal Exam: C-spine was in good range of motion.  Shoulder joints, elbow joints, wrist joints were in good range of motion.  She had  bilateral CMC PIP and DIP thickening.  No synovitis was noted.  Hip joints and knee joints with good range of motion.  She had no tenderness over ankles or MTPs.  CDAI Exam: CDAI Score: -- Patient Global: --; Provider Global: -- Swollen: --; Tender: -- Joint Exam 12/08/2021   No joint exam has been documented for this visit   There is currently no information documented on the homunculus. Go to the Rheumatology activity and complete the homunculus joint exam.  Investigation: No additional findings.  Imaging: US Guided Needle Placement  Result Date: 12/08/2021 Ultrasound guided injection is preferred based studies that show increased duration, increased effect, greater accuracy, decreased procedural pain, increased response rate, and decreased cost with ultrasound guided versus blind injection.   Verbal informed consent obtained.  Time-out conducted.  Noted no overlying erythema, induration, or other signs of local infection. Ultrasound-guided CMC injection: After sterile prep with Betadine, injected 0.5 mL of 1% lidocaine and 10 mg Kenalog using a 27-gauge needle, needle placement was confirmed in the [facility] joint under ultrasound guidance.  US Guided Needle Placement  Result Date: 12/08/2021 Ultrasound guided injection is preferred based studies that show  increased duration, increased effect, greater accuracy, decreased procedural pain, increased response rate, and decreased cost with ultrasound guided versus blind injection.   Verbal informed consent obtained.  Time-out conducted.  Noted no overlying erythema, induration, or other signs of local infection. Ultrasound-guided CMC injection: After sterile prep with Betadine, injected 0.5 mL of 1% lidocaine and 10 mg Kenalog using a 27-gauge needle, needle placement was confirmed in the [facility] joint under ultrasound guidance.     Recent Labs: Lab Results  Component Value Date   WBC 6.3 11/10/2021   HGB 12.3 11/10/2021   PLT 235.0 11/10/2021   NA 140 11/10/2021   K 3.8 11/10/2021   CL 105 11/10/2021   CO2 28 11/10/2021   GLUCOSE 102 (H) 11/10/2021   BUN 20 11/10/2021   CREATININE 0.69 11/10/2021   BILITOT 0.9 11/10/2021   ALKPHOS 63 11/10/2021   AST 22 11/10/2021   ALT 16 11/10/2021   PROT 6.5 11/10/2021   ALBUMIN 3.9 11/10/2021   CALCIUM 9.0 11/10/2021   GFRAA >60 04/21/2020    Speciality Comments: No specialty comments available.  Procedures:   Ultrasound guided injection is preferred based studies that show increased duration, increased effect, greater accuracy, decreased procedural pain, increased response rate, and decreased cost with ultrasound guided versus blind injection.   Verbal informed consent obtained.  Time-out conducted.  Noted no overlying erythema, induration, or other signs of local infection. Ultrasound-guided CMC injection: After sterile prep with Betadine, injected 0.5 mL of 1% lidocaine and 10 mg Kenalog using a 27-gauge needle, needle placement was confirmed in the [facility] joint under ultrasound guidance. Small Joint Inj: bilateral thumb CMC on 12/08/2021 8:39 AM Indications: pain Details: 27 G needle, ultrasound-guided radial approach  Spinal Needle: No  Medications (Right): 0.5 mL lidocaine 1 %; 10 mg triamcinolone acetonide 40 MG/ML Aspirate (Right): 0  mL Medications (Left): 0.5 mL lidocaine 1 %; 10 mg triamcinolone acetonide 40 MG/ML Aspirate (Left): 0 mL Outcome: tolerated well, no immediate complications Procedure, treatment alternatives, risks and benefits explained, specific risks discussed. Consent was given by the patient. Immediately prior to procedure a time out was called to verify the correct patient, procedure, equipment, support staff and site/side marked as required. Patient was prepped and draped in the usual sterile fashion.    Allergies: Clindamycin/lincomycin and Trazodone and nefazodone  Assessment / Plan:     Visit Diagnoses: Arthritis of carpometacarpal (CMC) joint of both thumbs -she has been having increased pain and discomfort in bilateral CMC joints recently.  She will be traveling and requests getting bilateral CMC joint injections.  Indications side effects contraindications were discussed at length.  Per patient's request bilateral CMC joints were injected with cortisone as described above.  She tolerated the procedure well.  Postprocedure instructions were given.  She was advised to use CMC brace.  Previous CMC joint injections were on 05/14/2020 and 11/12/2020, right CMC joint injection on 08/12/2021.  Primary osteoarthritis of both hands-she has severe osteoarthritis involving bilaterally hands.  She has bilateral CMC PIP and DIP thickening.  Primary osteoarthritis of both knees-she has pain and discomfort in her bilateral knee joints.  No warmth swelling or effusion was noted.  Primary osteoarthritis of both feet-she has been having pain and discomfort in her bilateral feet.  Rheumatoid factor positive-no synovitis was noted.  Other medical problems are listed as follows:  Primary insomnia  History of hypertension  History of gastroesophageal reflux (GERD)  History of adenomatous polyp of colon  History of hyperlipidemia  History of coronary artery disease  History of depression  Screening for  osteoporosis - DEXA scan on May 20, 2021 showed T score of -0.4 in the lumbar region.  Orders: Orders Placed This Encounter  Procedures   Small Joint Inj   US Guided Needle Placement   US Guided Needle Placement    No orders of the defined types were placed in this encounter.    Follow-Up Instructions: Return in about 6 months (around 06/08/2022) for Osteoarthritis.   Bo Merino, MD  Note - This record has been created using Editor, commissioning.  Chart creation errors have been sought, but may not always  have been located. Such creation errors do not reflect on  the standard of medical care.

## 2021-12-08 NOTE — Patient Instructions (Signed)
Hand Exercises Hand exercises can be helpful for almost anyone. These exercises can strengthen the hands, improve flexibility and movement, and increase blood flow to the hands. These results can make work and daily tasks easier. Hand exercises can be especially helpful for people who have joint pain from arthritis or have nerve damage from overuse (carpal tunnel syndrome). These exercises can also help people who have injured a hand. Exercises Most of these hand exercises are gentle stretching and motion exercises. It is usually safe to do them often throughout the day. Warming up your hands before exercise may help to reduce stiffness. You can do this with gentle massage or by placing your hands in warm water for 10-15 minutes. It is normal to feel some stretching, pulling, tightness, or mild discomfort as you begin new exercises. This will gradually improve. Stop an exercise right away if you feel sudden, severe pain or your pain gets worse. Ask your health care provider which exercises are best for you. Knuckle bend or "claw" fist  Stand or sit with your arm, hand, and all five fingers pointed straight up. Make sure to keep your wrist straight during the exercise. Gently bend your fingers down toward your palm until the tips of your fingers are touching the top of your palm. Keep your big knuckle straight and just bend the small knuckles in your fingers. Hold this position for __________ seconds. Straighten (extend) your fingers back to the starting position. Repeat this exercise 5-10 times with each hand. Full finger fist  Stand or sit with your arm, hand, and all five fingers pointed straight up. Make sure to keep your wrist straight during the exercise. Gently bend your fingers into your palm until the tips of your fingers are touching the middle of your palm. Hold this position for __________ seconds. Extend your fingers back to the starting position, stretching every joint fully. Repeat  this exercise 5-10 times with each hand. Straight fist Stand or sit with your arm, hand, and all five fingers pointed straight up. Make sure to keep your wrist straight during the exercise. Gently bend your fingers at the big knuckle, where your fingers meet your hand, and the middle knuckle. Keep the knuckle at the tips of your fingers straight and try to touch the bottom of your palm. Hold this position for __________ seconds. Extend your fingers back to the starting position, stretching every joint fully. Repeat this exercise 5-10 times with each hand. Tabletop  Stand or sit with your arm, hand, and all five fingers pointed straight up. Make sure to keep your wrist straight during the exercise. Gently bend your fingers at the big knuckle, where your fingers meet your hand, as far down as you can while keeping the small knuckles in your fingers straight. Think of forming a tabletop with your fingers. Hold this position for __________ seconds. Extend your fingers back to the starting position, stretching every joint fully. Repeat this exercise 5-10 times with each hand. Finger spread  Place your hand flat on a table with your palm facing down. Make sure your wrist stays straight as you do this exercise. Spread your fingers and thumb apart from each other as far as you can until you feel a gentle stretch. Hold this position for __________ seconds. Bring your fingers and thumb tight together again. Hold this position for __________ seconds. Repeat this exercise 5-10 times with each hand. Making circles  Stand or sit with your arm, hand, and all five fingers pointed   straight up. Make sure to keep your wrist straight during the exercise. Make a circle by touching the tip of your thumb to the tip of your index finger. Hold for __________ seconds. Then open your hand wide. Repeat this motion with your thumb and each finger on your hand. Repeat this exercise 5-10 times with each hand. Thumb  motion  Sit with your forearm resting on a table and your wrist straight. Your thumb should be facing up toward the ceiling. Keep your fingers relaxed as you move your thumb. Lift your thumb up as high as you can toward the ceiling. Hold for __________ seconds. Bend your thumb across your palm as far as you can, reaching the tip of your thumb for the small finger (pinkie) side of your palm. Hold for __________ seconds. Repeat this exercise 5-10 times with each hand. Grip strengthening  Hold a stress ball or other soft ball in the middle of your hand. Slowly increase the pressure, squeezing the ball as much as you can without causing pain. Think of bringing the tips of your fingers into the middle of your palm. All of your finger joints should bend when doing this exercise. Hold your squeeze for __________ seconds, then relax. Repeat this exercise 5-10 times with each hand. Contact a health care provider if: Your hand pain or discomfort gets much worse when you do an exercise. Your hand pain or discomfort does not improve within 2 hours after you exercise. If you have any of these problems, stop doing these exercises right away. Do not do them again unless your health care provider says that you can. Get help right away if: You develop sudden, severe hand pain or swelling. If this happens, stop doing these exercises right away. Do not do them again unless your health care provider says that you can. This information is not intended to replace advice given to you by your health care provider. Make sure you discuss any questions you have with your health care provider. Document Revised: 04/02/2021 Document Reviewed: 04/02/2021 Elsevier Patient Education  2022 [employer]  

## 2021-12-26 DIAGNOSIS — Z20822 Contact with and (suspected) exposure to covid-19: Secondary | ICD-10-CM | POA: Diagnosis not present

## 2021-12-27 DIAGNOSIS — Z20822 Contact with and (suspected) exposure to covid-19: Secondary | ICD-10-CM | POA: Diagnosis not present

## 2021-12-28 DIAGNOSIS — Z20822 Contact with and (suspected) exposure to covid-19: Secondary | ICD-10-CM | POA: Diagnosis not present

## 2022-01-07 DIAGNOSIS — Z20822 Contact with and (suspected) exposure to covid-19: Secondary | ICD-10-CM | POA: Diagnosis not present

## 2022-01-18 ENCOUNTER — Ambulatory Visit (INDEPENDENT_AMBULATORY_CARE_PROVIDER_SITE_OTHER): Payer: Medicare HMO

## 2022-01-18 ENCOUNTER — Telehealth (INDEPENDENT_AMBULATORY_CARE_PROVIDER_SITE_OTHER): Payer: Medicare HMO | Admitting: Family Medicine

## 2022-01-18 ENCOUNTER — Encounter: Payer: Self-pay | Admitting: Family Medicine

## 2022-01-18 VITALS — BP 132/76 | HR 63 | Temp 97.9°F | Ht 63.0 in | Wt 200.0 lb

## 2022-01-18 DIAGNOSIS — R61 Generalized hyperhidrosis: Secondary | ICD-10-CM | POA: Diagnosis not present

## 2022-01-18 DIAGNOSIS — I10 Essential (primary) hypertension: Secondary | ICD-10-CM

## 2022-01-18 DIAGNOSIS — R002 Palpitations: Secondary | ICD-10-CM

## 2022-01-18 DIAGNOSIS — R42 Dizziness and giddiness: Secondary | ICD-10-CM

## 2022-01-18 LAB — COMPREHENSIVE METABOLIC PANEL
ALT: 17 U/L (ref 0–35)
AST: 22 U/L (ref 0–37)
Albumin: 4.1 g/dL (ref 3.5–5.2)
Alkaline Phosphatase: 61 U/L (ref 39–117)
BUN: 21 mg/dL (ref 6–23)
CO2: 27 mEq/L (ref 19–32)
Calcium: 9.5 mg/dL (ref 8.4–10.5)
Chloride: 102 mEq/L (ref 96–112)
Creatinine, Ser: 0.78 mg/dL (ref 0.40–1.20)
GFR: 71.96 mL/min (ref >60.00)
Glucose, Bld: 98 mg/dL (ref 70–99)
Potassium: 5.1 mEq/L (ref 3.5–5.1)
Sodium: 139 mEq/L (ref 135–145)
Total Bilirubin: 0.9 mg/dL (ref 0.2–1.2)
Total Protein: 6.8 g/dL (ref 6.0–8.3)

## 2022-01-18 LAB — CBC WITH DIFFERENTIAL/PLATELET
Basophils Absolute: 0 10*3/uL (ref 0.0–0.1)
Basophils Relative: 0.4 % (ref 0.0–3.0)
Eosinophils Absolute: 0.1 10*3/uL (ref 0.0–0.7)
Eosinophils Relative: 1.1 % (ref 0.0–5.0)
HCT: 38.4 % (ref 36.0–46.0)
Hemoglobin: 12.8 g/dL (ref 12.0–15.0)
Lymphocytes Relative: 23.9 % (ref 12.0–46.0)
Lymphs Abs: 1.7 10*3/uL (ref 0.7–4.0)
MCHC: 33.4 g/dL (ref 30.0–36.0)
MCV: 87.6 fl (ref 78.0–100.0)
Monocytes Absolute: 0.4 10*3/uL (ref 0.1–1.0)
Monocytes Relative: 5.2 % (ref 3.0–12.0)
Neutro Abs: 5 10*3/uL (ref 1.4–7.7)
Neutrophils Relative %: 69.4 % (ref 43.0–77.0)
Platelets: 224 10*3/uL (ref 150.0–400.0)
RBC: 4.39 Mil/uL (ref 3.87–5.11)
RDW: 13.6 % (ref 11.5–15.5)
WBC: 7.2 10*3/uL (ref 4.0–10.5)

## 2022-01-18 LAB — TSH: TSH: 2.59 u[IU]/mL (ref 0.35–5.50)

## 2022-01-18 MED ORDER — BLOOD GLUCOSE MONITOR KIT: PACK | 0 refills | Status: DC

## 2022-01-18 NOTE — Patient Instructions (Addendum)
Schedule labs at the desk - can even be later today  We will call you within two weeks about your referral to cardiac monitor. If you do not hear within 2 weeks, give [place] a call. We are also ordering an echocardiogram. Finally I think itd be reasonable to see cardiology for their thoughts.   Lets try half tablet of metoprolol instead of whole tablet short term to see if maybe low heart rate is at play here  Check blood sugar with next episode. Also get a pulse ox (buy from pharmacy) to check heart rate and oxygen with these episodes.   I want you to sit down or lay down as soon as possible when episodes start  Make sure to stay well hydrated. Increase fluids during the day.   Recommended follow up: Return for as needed for new, worsening, persistent symptoms.Marland Kitchen

## 2022-01-18 NOTE — Progress Notes (Unsigned)
Enrolled for [employer] to mail a [facility] Live Telemetry monitor to patients address on file.  

## 2022-01-18 NOTE — Progress Notes (Signed)
Phone [phone] In person visit   Subjective:   [name] is a 81 y.o. year old very pleasant female patient who presents for/with See problem oriented charting Chief Complaint  Patient presents with   Excessive Sweating   This visit occurred during the SARS-CoV-2 public health emergency.  Safety protocols were in place, including screening questions prior to the visit, additional usage of staff PPE, and extensive cleaning of exam room while observing appropriate contact time as indicated for disinfecting solutions.   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[place]) 05/19/2007    Priority: Medium    Essential hypertension 05/19/2007    Priority: Medium    Insomnia 12/01/2015    Priority: Low   Glaucoma 12/01/2015    Priority: Low   Hammer toe     Priority: Low   Arthritis 08/29/2012    Priority: Low   GERD 09/06/2009    Priority: Low   Edema 09/06/2009    Priority: Low   History of syncope 09/06/2009    Priority: Low   Hyperglycemia 05/19/2007    Priority: Low   Diaphoresis 05/12/2018   Trigger finger, right middle finger 09/05/2017   Primary osteoarthritis of both knees 03/10/2017   Primary osteoarthritis of both hands 03/09/2017   Primary osteoarthritis of both feet 03/09/2017   Rheumatoid factor positive 03/09/2017    Medications- reviewed and updated Current Outpatient Medications  Medication Sig Dispense Refill   amLODipine (NORVASC) 2.5 MG tablet Take 1 tablet (2.5 mg total) by mouth daily. 90  tablet 3   aspirin 81 MG tablet Take 81 mg by mouth daily.      atorvastatin (LIPITOR) 80 MG tablet Take 1 tablet (80 mg total) by mouth every evening. 90 tablet 3   blood glucose meter kit and supplies KIT Dispense based on patient and insurance preference. Use up to four times daily as directed. 1 each 0   Cholecalciferol (VITAMIN D3) 50 MCG (2000 UT) TABS Take 1 tablet by mouth daily.     diclofenac sodium (VOLTAREN) 1 % GEL Apply 3 gm to 3 large joints up to 3 times a day.Dispense 3 tubes with 3 refills. 3 Tube 3   escitalopram (LEXAPRO) 20 MG tablet Take 1 tablet (20 mg total) by mouth daily. 90 tablet 3   metoprolol succinate (TOPROL-XL) 25 MG 24 hr tablet Take 1 tablet (25 mg total) by mouth daily. 90 tablet 3   omeprazole (PRILOSEC) 20 MG capsule Take 1 capsule (20 mg total) by mouth in the morning and at bedtime. 180 capsule 3   PFIZER COVID-19 VAC BIVALENT injection      vitamin B-12 (CYANOCOBALAMIN) 1000 MCG tablet Take 1,000 mcg by mouth daily.     No current facility-administered medications for this visit.     Objective:  BP 132/76 (BP Location: Left Arm, Patient Position: Sitting, Cuff Size: Large)    Pulse 63    Temp 97.9 F (36.6 C) (Temporal)    Ht $R'5\' 3"'dL$  (  1.6 m)    Wt 200 lb (90.7 kg)    SpO2 97%    BMI 35.43 kg/m  Gen: NAD, resting comfortably CV: RRR no murmurs rubs or gallops Lungs: CTAB no crackles, wheeze, rhonchi Ext: no edema Skin: warm, dry Neuro: CN II-XII intact, sensation and reflexes normal throughout, 5/5 muscle strength in bilateral upper and lower extremities. Normal finger to nose. Normal rapid alternating movements. No pronator drift. Normal romberg. Normal gait.    EKG: sinus bradycardia rhythm with rate 52, normal axis, normal intervals, no hypertrophy, no st or t wave changes- no signal on lead 6     Assessment and Plan   # Sweating episodes along with palpitations and lightheadedness with exercise S:since menopause  has had issues with feeling  sweaty all over- including bra and pants- maybe once a month or so. Does not feel nauseous. No chest pain or shortness of breath, let arm or neck pain- feels cool all over though particularly legs. Feels palpitations with episodes  This morning went to yoga for senior citizens. Halfway through class started sweating- sometimes could work through it but today could not. Felt a sensation of weakness in her legs like she could pass out/fall down. Lasted 20 minutes then resolved. Had similar situation last Thursday- was able to work through it last visit- total episode about 20 minutes. She does not want to give up the [facility]. Peppermint seems to help her- She is not sure if blood sugar is dropping. 90% of the time with exercise or with walking.  Over the last month-feels warm when others feel cold- keeping house temperature at 50.   In last month overall as had more frequent episodes at least once a week and this time has been as much as twice a week. Had stress test 10/02/2019.  Prior cardiac monitor in 2018-largely reassuring.  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 No slurred words or trouble swallowing. no blurry vision or double vision. No paresthesias. No confusion or word finding difficulties.   A/P: 81 year old female with exertional sweating episodes/palpitations/lightheadedness.  I have instructed her to stop when she is doing and sit down or lay down if this recurs.  Encouraged her to stay well-hydrated. - Check CBC, CMP, TSH - EKG with some bradycardia today-will reduce metoprolol to 12.5 mg extended release short-term and have her check heart rate with pulse ox with next episode - Order echocardiogram with palpitations - Encouraged cardiology follow-up for their opinion on carotid stenosis and new symptoms - Not currently having chest pain but has had prior  ischemic work-up-plan was for medical management-reassuring stress test 10/02/2019 - glucometer given to check blood sugar with episodes -Patient denies orthostatic symptoms - If she has significantly worsening symptoms should seek care immediately  #hypertension S: medication: Amlodipine 2.5 mg, metoprolol 25 mg extended release BP Readings from Last 3 Encounters:  01/18/22 132/76  12/08/21 (!) 148/71  11/10/21 110/60  A/P: Blood pressure is reasonably well controlled-we will reduce metoprolol to 12.5 mg extended release due to bradycardia on EKG-continue amlodipine-hopeful she can tolerate this.  Recommended follow up: Return for as needed for new, worsening, persistent symptoms. Future Appointments  Date Time Provider Speed  02/18/2022  2:30 PM Warren Danes, [place] [facility]  05/10/2022  1:20 PM Marin Olp, MD [facility] [place] [facility]  06/08/2022  9:15 AM  Bo Merino, MD [facility] None    Lab/Order associations:   ICD-10-CM   1. Light headedness  R42 EKG 12-Lead    LONG TERM MONITOR-LIVE TELEMETRY (3-14 DAYS)    2. Excessive sweating  R61 CBC with Differential/Platelet    Comprehensive metabolic panel    TSH    LONG TERM MONITOR-LIVE TELEMETRY (3-14 DAYS)    blood glucose meter kit and supplies KIT    3. Palpitations  R00.2 ECHOCARDIOGRAM COMPLETE      Meds ordered this encounter  Medications   blood glucose meter kit and supplies KIT    Sig: Dispense based on patient and insurance preference. Use up to four times daily as directed.    Dispense:  1 each    Refill:  0    Order Specific Question:   Number of strips    Answer:   100    Order Specific Question:   Number of lancets    Answer:   100   Time Spent: 30 minutes of total time (12: 23 PM- 12:53 PM) was spent on the date of the encounter performing the following actions: chart review prior to seeing the patient, obtaining history, performing a medically necessary exam, counseling on the treatment  plan, placing orders, and documenting in our EHR.  A separate 3-minutes was sent on EKG interpretation from 12 19 to 12 22  Return precautions advised.  Garret Reddish, MD

## 2022-01-20 DIAGNOSIS — R61 Generalized hyperhidrosis: Secondary | ICD-10-CM

## 2022-01-20 DIAGNOSIS — R42 Dizziness and giddiness: Secondary | ICD-10-CM

## 2022-01-21 DIAGNOSIS — R42 Dizziness and giddiness: Secondary | ICD-10-CM | POA: Diagnosis not present

## 2022-01-21 DIAGNOSIS — R61 Generalized hyperhidrosis: Secondary | ICD-10-CM | POA: Diagnosis not present

## 2022-02-08 ENCOUNTER — Encounter: Payer: Self-pay | Admitting: Family Medicine

## 2022-02-11 ENCOUNTER — Encounter: Payer: Self-pay | Admitting: Family Medicine

## 2022-02-12 ENCOUNTER — Encounter: Payer: Self-pay | Admitting: Family Medicine

## 2022-02-15 DIAGNOSIS — Z7982 Long term (current) use of aspirin: Secondary | ICD-10-CM | POA: Diagnosis not present

## 2022-02-15 DIAGNOSIS — Z008 Encounter for other general examination: Secondary | ICD-10-CM | POA: Diagnosis not present

## 2022-02-15 DIAGNOSIS — Z87891 Personal history of nicotine dependence: Secondary | ICD-10-CM | POA: Diagnosis not present

## 2022-02-15 DIAGNOSIS — I10 Essential (primary) hypertension: Secondary | ICD-10-CM | POA: Diagnosis not present

## 2022-02-15 DIAGNOSIS — R69 Illness, unspecified: Secondary | ICD-10-CM | POA: Diagnosis not present

## 2022-02-15 DIAGNOSIS — E785 Hyperlipidemia, unspecified: Secondary | ICD-10-CM | POA: Diagnosis not present

## 2022-02-15 DIAGNOSIS — Z8249 Family history of ischemic heart disease and other diseases of the circulatory system: Secondary | ICD-10-CM | POA: Diagnosis not present

## 2022-02-15 DIAGNOSIS — Z881 Allergy status to other antibiotic agents status: Secondary | ICD-10-CM | POA: Diagnosis not present

## 2022-02-15 DIAGNOSIS — Z833 Family history of diabetes mellitus: Secondary | ICD-10-CM | POA: Diagnosis not present

## 2022-02-15 DIAGNOSIS — Z888 Allergy status to other drugs, medicaments and biological substances status: Secondary | ICD-10-CM | POA: Diagnosis not present

## 2022-02-15 DIAGNOSIS — Z6835 Body mass index (BMI) 35.0-35.9, adult: Secondary | ICD-10-CM | POA: Diagnosis not present

## 2022-02-15 DIAGNOSIS — K219 Gastro-esophageal reflux disease without esophagitis: Secondary | ICD-10-CM | POA: Diagnosis not present

## 2022-02-16 ENCOUNTER — Encounter: Payer: Self-pay | Admitting: Family Medicine

## 2022-02-18 ENCOUNTER — Encounter: Payer: Self-pay | Admitting: Physician Assistant

## 2022-02-18 ENCOUNTER — Other Ambulatory Visit: Payer: Self-pay

## 2022-02-18 ENCOUNTER — Other Ambulatory Visit: Payer: Self-pay | Admitting: Physician Assistant

## 2022-02-18 ENCOUNTER — Ambulatory Visit: Payer: Medicare HMO | Admitting: Physician Assistant

## 2022-02-18 DIAGNOSIS — L304 Erythema intertrigo: Secondary | ICD-10-CM

## 2022-02-18 DIAGNOSIS — L570 Actinic keratosis: Secondary | ICD-10-CM

## 2022-02-18 DIAGNOSIS — L720 Epidermal cyst: Secondary | ICD-10-CM | POA: Diagnosis not present

## 2022-02-18 DIAGNOSIS — D485 Neoplasm of uncertain behavior of skin: Secondary | ICD-10-CM

## 2022-02-18 NOTE — Patient Instructions (Addendum)
°  Intertrigo- clotrimazole and hydrocortisone cream for under breasts    Biopsy, Surgery (Curettage) & Surgery (Excision) Aftercare Instructions  1. Okay to remove bandage in 24 hours  2. Wash area with soap and water  3. Apply Vaseline to area twice daily until healed (Not Neosporin)  4. Okay to cover with a Band-Aid to decrease the chance of infection or prevent irritation from clothing; also it's okay to uncover lesion at home.  5. Suture instructions: return to our office in 7-10 or 10-14 days for a nurse visit for suture removal. Variable healing with sutures, if pain or itching occurs call our office. It's okay to shower or bathe 24 hours after sutures are given.  6. The following risks may occur after a biopsy, curettage or excision: bleeding, scarring, discoloration, recurrence, infection (redness, yellow drainage, pain or swelling).  7. For questions, concerns and results call our office at [place] before 4pm & Friday before 3pm. Biopsy results will be available in 1 week.

## 2022-02-18 NOTE — Progress Notes (Signed)
° °  New Patient   Subjective  [name] is a 81 y.o. female who presents for the following: New Patient (Initial Visit) (Patient here today for skin check per patient this is her first visit to a Dermatologist. Per patient she has a lesion on her left calf x years that does itch no bleeding. No personal history or family history of atypical moles, melanoma or non mole skin cancer. ).   The following portions of the chart were reviewed this encounter and updated as appropriate:  Tobacco   Allergies   Meds   Problems   Med Hx   Surg Hx   Fam Hx       Objective  Well appearing patient in no apparent distress; mood and affect are within normal limits.  A full examination was performed including scalp, head, eyes, ears, nose, lips, neck, chest, axillae, abdomen, back, buttocks, bilateral upper extremities, bilateral lower extremities, hands, feet, fingers, toes, fingernails, and toenails. All findings within normal limits unless otherwise noted below.  Left Posterior Neck Enlarged black pore  Left Inframammary Fold, Right Inframammary Fold Erythema and scale  Left Buccal Cheek Erythematous patches with gritty scale.   Assessment & Plan  Neoplasm of uncertain behavior of skin Left Posterior Neck  Skin / nail biopsy Type of biopsy: punch   Informed consent: discussed and consent obtained   Timeout: patient name, date of birth, surgical site, and procedure verified   Procedure prep:  Patient was prepped and draped in usual sterile fashion (Non sterile) Prep type:  Chlorhexidine Anesthesia: the lesion was anesthetized in a standard fashion   Anesthetic:  1% lidocaine w/ epinephrine 1-100,000 local infiltration Punch size:  5 mm Suture size:  4-0 Suture type: nylon   Suture removal (days):  14 Hemostasis achieved with: suture   Outcome: patient tolerated procedure well   Post-procedure details: sterile dressing applied and wound care instructions given   Dressing type:  bandage and petrolatum   Additional details:  4-0 Nylon x 2  Specimen 1 - Surgical pathology Differential Diagnosis: R/O Cyst  Check Margins: No  No photo no measurement per KRS  Intertrigo Left Inframammary Fold; Right Inframammary Fold  OTC clotrimazole and hydrocortisone- mix together and apply at night. Attempt to keep the area dry.   AK (actinic keratosis) Left Buccal Cheek  Destruction of lesion - Left Buccal Cheek Complexity: simple   Destruction method: cryotherapy   Informed consent: discussed and consent obtained   Timeout:  patient name, date of birth, surgical site, and procedure verified Lesion destroyed using liquid nitrogen: Yes   Region frozen until ice ball extended beyond lesion: Yes   Cryotherapy cycles:  1 Outcome: patient tolerated procedure well with no complications   Post-procedure details: wound care instructions given       I, Emmaly Leech, PA-C, have reviewed all documentation's for this visit.  The documentation on 02/18/22 for the exam, diagnosis, procedures and orders are all accurate and complete.

## 2022-03-02 ENCOUNTER — Ambulatory Visit (INDEPENDENT_AMBULATORY_CARE_PROVIDER_SITE_OTHER): Payer: Medicare HMO

## 2022-03-02 ENCOUNTER — Other Ambulatory Visit: Payer: Self-pay

## 2022-03-02 DIAGNOSIS — Z4802 Encounter for removal of sutures: Secondary | ICD-10-CM

## 2022-03-02 NOTE — Progress Notes (Signed)
SUTURES REMOVED. PT TOLERATED WELL. ?

## 2022-03-04 ENCOUNTER — Other Ambulatory Visit: Payer: Self-pay

## 2022-03-04 ENCOUNTER — Ambulatory Visit (HOSPITAL_COMMUNITY): Payer: Medicare HMO | Attending: Cardiovascular Disease

## 2022-03-04 DIAGNOSIS — I361 Nonrheumatic tricuspid (valve) insufficiency: Secondary | ICD-10-CM | POA: Diagnosis not present

## 2022-03-04 DIAGNOSIS — I119 Hypertensive heart disease without heart failure: Secondary | ICD-10-CM | POA: Diagnosis not present

## 2022-03-04 DIAGNOSIS — E785 Hyperlipidemia, unspecified: Secondary | ICD-10-CM | POA: Diagnosis not present

## 2022-03-04 DIAGNOSIS — R002 Palpitations: Secondary | ICD-10-CM | POA: Diagnosis not present

## 2022-03-04 DIAGNOSIS — I779 Disorder of arteries and arterioles, unspecified: Secondary | ICD-10-CM | POA: Insufficient documentation

## 2022-03-04 DIAGNOSIS — I340 Nonrheumatic mitral (valve) insufficiency: Secondary | ICD-10-CM

## 2022-03-04 DIAGNOSIS — R55 Syncope and collapse: Secondary | ICD-10-CM | POA: Diagnosis not present

## 2022-03-04 LAB — ECHOCARDIOGRAM COMPLETE
Area-P 1/2: 6.71 cm2
S' Lateral: 2.6 cm

## 2022-03-19 DIAGNOSIS — H353131 Nonexudative age-related macular degeneration, bilateral, early dry stage: Secondary | ICD-10-CM | POA: Diagnosis not present

## 2022-03-19 DIAGNOSIS — H52203 Unspecified astigmatism, bilateral: Secondary | ICD-10-CM | POA: Diagnosis not present

## 2022-03-19 DIAGNOSIS — Z961 Presence of intraocular lens: Secondary | ICD-10-CM | POA: Diagnosis not present

## 2022-04-29 ENCOUNTER — Encounter: Payer: Self-pay | Admitting: Physician Assistant

## 2022-04-29 ENCOUNTER — Ambulatory Visit: Payer: Medicare HMO | Admitting: Physician Assistant

## 2022-04-29 DIAGNOSIS — L820 Inflamed seborrheic keratosis: Secondary | ICD-10-CM | POA: Diagnosis not present

## 2022-04-29 DIAGNOSIS — L570 Actinic keratosis: Secondary | ICD-10-CM | POA: Diagnosis not present

## 2022-05-05 ENCOUNTER — Encounter: Payer: Self-pay | Admitting: Physician Assistant

## 2022-05-05 NOTE — Progress Notes (Signed)
? ?  Follow-Up Visit ?  ?Subjective  ?[name] is a 81 y.o. female who presents for the following: Skin Problem (Spot on left lower leg- wants removed & right cheek- dark spot wants removed). ? ? ?The following portions of the chart were reviewed this encounter and updated as appropriate:  Tobacco  Allergies  Meds  Problems  Med Hx  Surg Hx  Fam Hx   ?  ? ?Objective  ?Well appearing patient in no apparent distress; mood and affect are within normal limits. ? ?A full examination was performed including scalp, head, eyes, ears, nose, lips, neck, chest, axillae, abdomen, back, buttocks, bilateral upper extremities, bilateral lower extremities, hands, feet, fingers, toes, fingernails, and toenails. All findings within normal limits unless otherwise noted below. ? ?Right Chest ?Erythematous patches with gritty scale. ? ?Left Eyebrow, Left Forearm - Posterior, Left Lower Leg - Posterior, Left Temple (2), Left Zygomatic Area, Right Nasal Sidewall, Right Temple, Right Zygomatic Area (3) ?Stuck-on, waxy papules and plaques on an erythematous base.  ? ? ?Assessment & Plan  ?AK (actinic keratosis) ?Right Chest ? ?Destruction of lesion - Right Chest ?Complexity: simple   ?Destruction method: cryotherapy   ?Informed consent: discussed and consent obtained   ?Timeout:  patient name, date of birth, surgical site, and procedure verified ?Lesion destroyed using liquid nitrogen: Yes   ?Cryotherapy cycles:  3 ?Outcome: patient tolerated procedure well with no complications   ? ?Seborrheic keratosis, inflamed (11) ?Left Forearm - Posterior; Left Lower Leg - Posterior; Left Eyebrow; Left Temple (2); Right Temple; Right Nasal Sidewall; Left Zygomatic Area; Right Zygomatic Area (3) ? ?Destruction of lesion - Left Eyebrow, Left Forearm - Posterior, Left Lower Leg - Posterior, Left Temple, Left Zygomatic Area, Right Nasal Sidewall, Right Temple, Right Zygomatic Area ?Complexity: simple   ?Destruction method: cryotherapy    ?Informed consent: discussed and consent obtained   ?Timeout:  patient name, date of birth, surgical site, and procedure verified ?Lesion destroyed using liquid nitrogen: Yes   ?Cryotherapy cycles:  3 ?Outcome: patient tolerated procedure well with no complications   ? ? ? ?I, Siris Hoos, PA-C, have reviewed all documentation's for this visit.  The documentation on 05/05/22 for the exam, diagnosis, procedures and orders are all accurate and complete. ?

## 2022-05-07 ENCOUNTER — Other Ambulatory Visit: Payer: Self-pay | Admitting: Gastroenterology

## 2022-05-07 DIAGNOSIS — K921 Melena: Secondary | ICD-10-CM

## 2022-05-07 DIAGNOSIS — K219 Gastro-esophageal reflux disease without esophagitis: Secondary | ICD-10-CM

## 2022-05-10 ENCOUNTER — Ambulatory Visit (INDEPENDENT_AMBULATORY_CARE_PROVIDER_SITE_OTHER): Payer: Medicare HMO | Admitting: Family Medicine

## 2022-05-10 ENCOUNTER — Encounter: Payer: Self-pay | Admitting: Family Medicine

## 2022-05-10 VITALS — BP 110/64 | HR 69 | Temp 98.2°F | Ht 63.0 in | Wt 200.4 lb

## 2022-05-10 DIAGNOSIS — I2510 Atherosclerotic heart disease of native coronary artery without angina pectoris: Secondary | ICD-10-CM

## 2022-05-10 DIAGNOSIS — Z0000 Encounter for general adult medical examination without abnormal findings: Secondary | ICD-10-CM

## 2022-05-10 DIAGNOSIS — F325 Major depressive disorder, single episode, in full remission: Secondary | ICD-10-CM

## 2022-05-10 DIAGNOSIS — I10 Essential (primary) hypertension: Secondary | ICD-10-CM | POA: Diagnosis not present

## 2022-05-10 DIAGNOSIS — R739 Hyperglycemia, unspecified: Secondary | ICD-10-CM | POA: Diagnosis not present

## 2022-05-10 DIAGNOSIS — E785 Hyperlipidemia, unspecified: Secondary | ICD-10-CM

## 2022-05-10 DIAGNOSIS — R69 Illness, unspecified: Secondary | ICD-10-CM | POA: Diagnosis not present

## 2022-05-10 NOTE — Patient Instructions (Addendum)
Schedule a lab visit at the check out desk within 2 weeks. Return for future fasting labs meaning nothing but water after midnight please. Ok to take your medications with water.  ? ?Glad you are doing so well!  ? ?See letter about railing- team please make sure I sign this ? ?Recommended follow up: Return in about 6 months (around 11/10/2022) for followup or sooner if needed.Schedule b4 you leave. ?

## 2022-05-10 NOTE — Progress Notes (Signed)
?Phone [phone] ?  ?Subjective:  ?Patient presents today for their annual physical. Chief complaint-noted.  ? ?See problem oriented charting- ?ROS- full  review of systems was completed and negative ?except for: sparing balance issues but working on this- had one fall while carrying heavy mulch- she is going to be more cautious ? ?The following were reviewed and entered/updated in epic: ?Past Medical History:  ?Diagnosis Date  ? Anginal pain ([place]) 08/29/2012  ? "have had problems w/this"  ? Anxiety 08/29/2012  ? Arthritis 08/29/2012  ? right hand, Seen by Dr. Estanislado Pandy  ? CAD (coronary artery disease)   ? nonobstructive  ? Depression   ? Exertional dyspnea 08/29/2012  ? GERD (gastroesophageal reflux disease)   ? Hammer toe   ? surgery x2, second with Dr. Sharol Given- only other option amputatoin  ? HLD (hyperlipidemia)   ? HTN (hypertension)   ? PNEUMONIA 07/04/2007  ? Qualifier: Diagnosis of  By: Larose Kells MD, [place] Syncope and collapse 08/29/2012  ? "first time ever"  ? ?Patient Active Problem List  ? Diagnosis Date Noted  ? Advanced directives, counseling/discussion 01/26/2017  ?  Priority: High  ? nonobstructive CAD (coronary artery disease) 12/01/2015  ?  Priority: High  ? Memory loss 12/01/2015  ?  Priority: High  ? Itchy skin 05/30/2020  ?  Priority: Medium   ? History of adenomatous polyp of colon 08/01/2017  ?  Priority: Medium   ? Varicose veins of both lower extremities 12/01/2015  ?  Priority: Medium   ? 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  Priority: Medium   ? Essential hypertension 05/19/2007  ?  Priority: Medium   ? Insomnia 12/01/2015  ?  Priority: Low  ? Glaucoma 12/01/2015  ?  Priority: Low  ? Hammer toe   ?  Priority: Low  ? Arthritis 08/29/2012  ?  Priority: Low  ? GERD 09/06/2009  ?  Priority: Low  ? Edema 09/06/2009  ?  Priority: Low  ? History of syncope 09/06/2009  ?  Priority: Low  ? Hyperglycemia 05/19/2007  ?  Priority: Low  ? Diaphoresis 05/12/2018  ? Trigger finger, right middle  finger 09/05/2017  ? Primary osteoarthritis of both knees 03/10/2017  ? Primary osteoarthritis of both hands 03/09/2017  ? Primary osteoarthritis of both feet 03/09/2017  ? Rheumatoid factor positive 03/09/2017  ? ?Past Surgical History:  ?Procedure Laterality Date  ? ABDOMINAL HYSTERECTOMY  1970's  ? APPENDECTOMY  ~ 1970  ? CATARACT EXTRACTION W/ INTRAOCULAR LENS  IMPLANT, BILATERAL  ~ 2010  ? bilateral  ? TONSILLECTOMY AND ADENOIDECTOMY  1950's  ? VEIN LIGATION AND STRIPPING  2012  ? bilaterally  ? ? ?Family History  ?Problem Relation Age of Onset  ? Heart disease Mother   ?     brother, father  ? Diabetes Mother   ?     sister, brother  ? Suicidality Father   ?     66  ? Coronary artery disease Brother   ? Colon cancer Brother   ? Stroke Brother   ? Alcoholism Brother   ? Ovarian cancer Daughter   ? Esophageal cancer Neg Hx   ? Rectal cancer Neg Hx   ? Stomach cancer Neg Hx   ? Liver cancer Neg Hx   ? ? ?Medications- reviewed and updated ?Current Outpatient Medications  ?Medication Sig Dispense Refill  ? amLODipine (NORVASC) 2.5 MG tablet Take 1 tablet (2.5 mg total) by mouth daily.  90 tablet 3  ? aspirin 81 MG tablet Take 81 mg by mouth daily.     ? atorvastatin (LIPITOR) 80 MG tablet Take 1 tablet (80 mg total) by mouth every evening. 90 tablet 3  ? Cholecalciferol (VITAMIN D3) 50 MCG (2000 UT) TABS Take 1 tablet by mouth daily.    ? diclofenac sodium (VOLTAREN) 1 % GEL Apply 3 gm to 3 large joints up to 3 times a day.Dispense 3 tubes with 3 refills. 3 Tube 3  ? escitalopram (LEXAPRO) 20 MG tablet Take 1 tablet (20 mg total) by mouth daily. 90 tablet 3  ? omeprazole (PRILOSEC) 20 MG capsule Take 1 capsule (20 mg total) by mouth in the morning and at bedtime. 180 capsule 3  ? vitamin B-12 (CYANOCOBALAMIN) 1000 MCG tablet Take 1,000 mcg by mouth daily.    ? ?No current facility-administered medications for this visit.  ? ? ?Allergies-reviewed and updated ?Allergies  ?Allergen Reactions  ? Clindamycin/Lincomycin    ?  Severe rash and into throat  ? Trazodone And Nefazodone Other (See Comments)  ?  drowsiness  ? ? ?Social History  ? ?Social History Narrative  ? Widowed 10/31/2013. Daughter died 03/31/2012. 2 living children. 3 grandkids (2 in [place], 1 lives here- volunteers at her school and picks her up)  ?   ? 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   ? 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   ? Hobbies: gardening, time with dog, time with grandkids  ? ?Objective  ?Objective:  ?BP 110/64   Pulse 69   Temp 98.2 ?F (36.8 ?C)   Ht '5\' 3"'$  (1.6 m)   Wt 200 lb 6.4 oz (90.9 kg)   SpO2 98%   BMI 35.50 kg/m?  ?Gen: NAD, resting comfortably ?HEENT: Mucous membranes are moist. Oropharynx normal ?Neck: no thyromegaly ?CV: RRR slight murmur- had echo 03/04/22- aortic sclerosis with mitral regurg as well ?Lungs: CTAB no crackles, wheeze, rhonchi ?Abdomen: soft/nontender/nondistended/normal bowel sounds. No rebound or guarding.  ?Ext: trace edema- slightly worse on right (doesn tolerate in the summer) ?Skin: warm, dry ?Neuro: grossly normal, moves all extremities, PERRLA ?  ?Assessment and Plan  ? ?81 y.o. female presenting for annual physical.  ?Health Maintenance counseling: ?1. Anticipatory guidance: Patient counseled regarding regular dental exams -q6 months, eye exams-follows yearly with glaucoma- yearly,  avoiding smoking and second hand smoke , limiting alcohol to 1 beverage per day-once a month or less, no illicit drugs .   ?2. Risk factor reduction:  Advised patient of need for regular exercise and diet rich and fruits and vegetables to reduce risk of heart attack and stroke.  ?Exercise- 4 days a week [facility] [employer] including 2 stability classes plus active in [place]. Also plans to do pool soon ?Diet/weight management-stable overall over the last year-up to slightly 2 pounds- usually ranges within 2-3 lbs she states ?Wt Readings from Last 3 Encounters:  ?05/10/22 200 lb 6.4 oz (90.9 kg)  ?01/18/22 200 lb (90.7 kg)  ?12/08/21 199  lb 12.8 oz (90.6 kg)  ?3. Immunizations/screenings/ancillary studies-fully up-to-date-discussed possible COVID bivalent vaccination  ?Immunization History  ?Administered Date(s) Administered  ? Fluad Quad(high Dose 65+) 09/25/2019, 10/07/2020  ? Influenza, High Dose Seasonal PF 08/24/2016, 09/28/2018  ? Influenza-Unspecified 11/12/2015, 09/25/2019, 09/03/2021  ? PFIZER Comirnaty(Gray Top)Covid-19 Tri-Sucrose Vaccine 02/17/2021  ? PFIZER(Purple Top)SARS-COV-2 Vaccination 01/21/2020, 02/11/2020, 08/19/2020, 11/13/2021  ? PPD Test 05/06/2021  ? Pneumococcal Conjugate-13 08/24/2016  ? Pneumococcal Polysaccharide-23 12/29/1999  ?  Pneumococcal-Unspecified 08/27/2010  ? Td 12/28/2005, 10/26/2016  ? Zoster Recombinat (Shingrix) 12/01/2021, 02/12/2022  ?4. Cervical cancer screening- no History of abnormal Pap smear-past age based screening recommendations ?5. Breast cancer screening-  breast exam (prefers self exam) and mammogram -04/17/21 and plans to stop ?6. Colon cancer screening - was told no further colonoscopy due to age per Dr. Silverio Decamp ?7. Skin cancer screening-saw dermatology last year- had a few spots removed but not cancerous. advised regular sunscreen use. Denies worrisome, changing, or new skin lesions.  ?8. Birth control/STD check- not dating ?9. Osteoporosis screening at 59- 05/18/2021 normal ?10. Smoking associated screening -former smoker-quit smoking in 1980-no regular screening required ? ?Status of chronic or acute concerns  ? ?#social update- beach in June with multiple siblings! Oldest is 95 brother in law) . She paid for the whole beach house!  ? ?#Nonobstructive CAD ? #hyperlipidemia ?S: Medication:Atorvastatin 80 mg, aspirin 81 mg no cp or sob ?Lab Results  ?Component Value Date  ? CHOL 144 05/08/2021  ? HDL 55.80 05/08/2021  ? Moons[place]ine 69 05/08/2021  ? LDLDIRECT 65.0 05/30/2020  ? TRIG 99.0 05/08/2021  ? CHOLHDL 3 05/08/2021  ?A/P: Nonobstructive CAD remains asymptomatic-continue current medications.   Lipids have been well managed-update lipid panel with labs ?  ?#hypertension ?S: medication: Amlodipine 2.5 mg, metoprolol 25 mg extended release in the past- she has come off of this as was having lightheaded

## 2022-05-11 ENCOUNTER — Other Ambulatory Visit: Payer: Self-pay | Admitting: Gastroenterology

## 2022-05-11 ENCOUNTER — Telehealth: Payer: Self-pay | Admitting: Family Medicine

## 2022-05-11 DIAGNOSIS — K921 Melena: Secondary | ICD-10-CM

## 2022-05-11 DIAGNOSIS — K219 Gastro-esophageal reflux disease without esophagitis: Secondary | ICD-10-CM

## 2022-05-11 MED ORDER — OMEPRAZOLE 20 MG PO CPDR: 20.0000 mg | DELAYED_RELEASE_CAPSULE | Freq: Two times a day (BID) | ORAL | 3 refills | Status: DC

## 2022-05-11 NOTE — Telephone Encounter (Signed)
Refill sent to pharmacy.   

## 2022-05-11 NOTE — Telephone Encounter (Signed)
.. ?  Encourage patient to contact the pharmacy for refills or they can request refills through [facility] ? ?LAST APPOINTMENT DATE:  05/10/22 ? ?NEXT APPOINTMENT DATE: 11/10/22 ? ?MEDICATION:omeprazole (PRILOSEC) 20 MG capsule ? ?Is the patient out of medication?  ? ?PHARMACY: ?[facility] # [place], [place] Phone:  [phone]  ?Fax:  [phone]  ?  ? ? ?Let patient know to contact pharmacy at the end of the day to make sure medication is ready. ? ?Please notify patient to allow 48-72 hours to process  ?

## 2022-05-12 ENCOUNTER — Ambulatory Visit: Payer: Medicare HMO | Admitting: Rheumatology

## 2022-05-14 ENCOUNTER — Other Ambulatory Visit (INDEPENDENT_AMBULATORY_CARE_PROVIDER_SITE_OTHER): Payer: Medicare HMO

## 2022-05-14 DIAGNOSIS — R739 Hyperglycemia, unspecified: Secondary | ICD-10-CM | POA: Diagnosis not present

## 2022-05-14 DIAGNOSIS — E785 Hyperlipidemia, unspecified: Secondary | ICD-10-CM

## 2022-05-14 LAB — LIPID PANEL
Cholesterol: 142 mg/dL (ref 0–200)
HDL: 61.2 mg/dL (ref >39.00)
LDL Cholesterol: 67 mg/dL (ref 0–99)
NonHDL: 80.92
Total CHOL/HDL Ratio: 2
Triglycerides: 72 mg/dL (ref 0.0–149.0)
VLDL: 14.4 mg/dL (ref 0.0–40.0)

## 2022-05-14 LAB — CBC WITH DIFFERENTIAL/PLATELET
Basophils Absolute: 0 10*3/uL (ref 0.0–0.1)
Basophils Relative: 0.5 % (ref 0.0–3.0)
Eosinophils Absolute: 0.1 10*3/uL (ref 0.0–0.7)
Eosinophils Relative: 1.6 % (ref 0.0–5.0)
HCT: 36 % (ref 36.0–46.0)
Hemoglobin: 12.6 g/dL (ref 12.0–15.0)
Lymphocytes Relative: 34.3 % (ref 12.0–46.0)
Lymphs Abs: 1.9 10*3/uL (ref 0.7–4.0)
MCHC: 34.9 g/dL (ref 30.0–36.0)
MCV: 85.6 fl (ref 78.0–100.0)
Monocytes Absolute: 0.4 10*3/uL (ref 0.1–1.0)
Monocytes Relative: 7.2 % (ref 3.0–12.0)
Neutro Abs: 3.1 10*3/uL (ref 1.4–7.7)
Neutrophils Relative %: 56.4 % (ref 43.0–77.0)
Platelets: 246 10*3/uL (ref 150.0–400.0)
RBC: 4.21 Mil/uL (ref 3.87–5.11)
RDW: 14.4 % (ref 11.5–15.5)
WBC: 5.5 10*3/uL (ref 4.0–10.5)

## 2022-05-14 LAB — COMPREHENSIVE METABOLIC PANEL
ALT: 19 U/L (ref 0–35)
AST: 24 U/L (ref 0–37)
Albumin: 4.1 g/dL (ref 3.5–5.2)
Alkaline Phosphatase: 66 U/L (ref 39–117)
BUN: 22 mg/dL (ref 6–23)
CO2: 29 mEq/L (ref 19–32)
Calcium: 9.5 mg/dL (ref 8.4–10.5)
Chloride: 104 mEq/L (ref 96–112)
Creatinine, Ser: 0.83 mg/dL (ref 0.40–1.20)
GFR: 66.64 mL/min (ref >60.00)
Glucose, Bld: 98 mg/dL (ref 70–99)
Potassium: 4.1 mEq/L (ref 3.5–5.1)
Sodium: 141 mEq/L (ref 135–145)
Total Bilirubin: 0.7 mg/dL (ref 0.2–1.2)
Total Protein: 6.8 g/dL (ref 6.0–8.3)

## 2022-05-14 LAB — HEMOGLOBIN A1C: Hgb A1c MFr Bld: 5.9 % (ref 4.6–6.5)

## 2022-05-26 NOTE — Progress Notes (Signed)
Office Visit Note  Patient: [name]             Date of Birth: Jan 23, 1941           MRN: [id number]             PCP: Marin Olp, MD Referring: Marin Olp, MD Visit Date: 06/08/2022 Occupation: '@GUAROCC'$ @  Subjective:  Pain in both hands  History of Present Illness: [name] is a 81 y.o. female with history of osteoarthritis.  She states she has been working out on a regular basis and also has been doing gardening.  She has been having increased pain and discomfort in her bilateral CMC joints.  She had cortisone injections in her [facility] joints in December 2022 with good results.  She would like to have repeat injections today.  She continues to have some stiffness in her hands.  Knee joint pain is manageable.  She has overcrowding of her toes which causes discomfort in her feet.  She denies any history of joint swelling.  Activities of Daily Living:  Patient reports morning stiffness for 15 minutes.   Patient Denies nocturnal pain.  Difficulty dressing/grooming: Denies Difficulty climbing stairs: Denies Difficulty getting out of chair: Denies Difficulty using hands for taps, buttons, cutlery, and/or writing: Denies  Review of Systems  Constitutional:  Negative for fatigue.  HENT:  Negative for mouth dryness.   Eyes:  Negative for dryness.  Respiratory:  Negative for shortness of breath.   Cardiovascular:  Negative for swelling in legs/feet.  Gastrointestinal:  Negative for constipation.  Endocrine: Positive for heat intolerance and excessive thirst.  Genitourinary:  Negative for difficulty urinating.  Musculoskeletal:  Positive for joint pain, joint pain, joint swelling and morning stiffness.  Skin:  Negative for rash.  Allergic/Immunologic: Negative for susceptible to infections.  Neurological:  Negative for numbness.  Hematological:  Positive for bruising/bleeding tendency.  Psychiatric/Behavioral:  Positive for sleep disturbance.     PMFS  History:  Patient Active Problem List   Diagnosis Date Noted   Itchy skin 05/30/2020   Diaphoresis 05/12/2018   Trigger finger, right middle finger 09/05/2017   History of adenomatous polyp of colon 08/01/2017   Primary osteoarthritis of both knees 03/10/2017   Primary osteoarthritis of both hands 03/09/2017   Primary osteoarthritis of both feet 03/09/2017   Rheumatoid factor positive 03/09/2017   Advanced directives, counseling/discussion 01/26/2017   nonobstructive CAD (coronary artery disease) 12/01/2015   Varicose veins of both lower extremities 12/01/2015   Insomnia 12/01/2015   Glaucoma 12/01/2015   Memory loss 12/01/2015   Hammer toe    Arthritis 08/29/2012   GERD 09/06/2009   Edema 09/06/2009   History of syncope 09/06/2009   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 Estanislado Pandy   CAD (coronary artery disease)    nonobstructive   Depression    Exertional dyspnea 08/29/2012   GERD (gastroesophageal reflux disease)    Hammer toe    surgery x2, second with Dr. Sharol Given- only other option amputatoin   HLD (hyperlipidemia)    HTN (hypertension)    PNEUMONIA 07/04/2007   Qualifier: Diagnosis of  By: Larose Kells MD, [place]    Syncope and collapse 08/29/2012   "first time  ever"    Family History  Problem Relation Age of Onset   Heart disease Mother        brother, father   Diabetes Mother        sister, brother   Suicidality Father        74   Coronary artery disease Brother    Colon cancer Brother    Stroke Brother    Alcoholism Brother    Ovarian cancer Daughter    Esophageal cancer Neg Hx    Rectal cancer Neg Hx    Stomach cancer Neg Hx    Liver cancer Neg Hx    Past Surgical History:  Procedure Laterality Date   ABDOMINAL  HYSTERECTOMY  1970's   APPENDECTOMY  ~ 1970   CATARACT EXTRACTION W/ INTRAOCULAR LENS  IMPLANT, BILATERAL  ~ 2009/03/20   bilateral   TONSILLECTOMY AND ADENOIDECTOMY  1950's   VEIN LIGATION AND STRIPPING  2012   bilaterally   Social History   Social History Narrative   Widowed 10-20-13. Daughter died 03/20/2012. 2 living children. 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      Hobbies: gardening, time with dog, time with grandkids   Immunization History  Administered Date(s) Administered   Fluad Quad(high Dose 65+) 09/25/2019, 10/07/2020   Influenza, High Dose Seasonal PF 08/24/2016, 09/28/2018   Influenza-Unspecified 11/12/2015, 09/25/2019, 09/03/2021   PFIZER Comirnaty(Gray Top)Covid-19 Tri-Sucrose Vaccine 02/17/2021   PFIZER(Purple Top)SARS-COV-2 Vaccination 01/21/2020, 02/11/2020, 08/19/2020, 11/13/2021   PPD Test 05/06/2021   Pneumococcal Conjugate-13 08/24/2016   Pneumococcal Polysaccharide-23 12/29/1999   Pneumococcal-Unspecified 08/27/2010   Td 12/28/2005, 10/26/2016   Zoster Recombinat (Shingrix) 12/01/2021, 02/12/2022     Objective: Vital Signs: BP (!) 150/73 (BP Location: Left Arm, Patient Position: Sitting, Cuff Size: Large)   Pulse 65   Resp 13   Ht '5\' 3"'$  (1.6 m)   Wt 198 lb (89.8 kg)   BMI 35.07 kg/m    Physical Exam Vitals and nursing note reviewed.  Constitutional:      Appearance: She is well-developed.  HENT:     Head: Normocephalic and atraumatic.  Eyes:     Conjunctiva/sclera: Conjunctivae normal.  Cardiovascular:     Rate and Rhythm: Normal rate and regular rhythm.     Heart sounds: Normal heart sounds.  Pulmonary:     Effort: Pulmonary effort is normal.     Breath sounds: Normal breath sounds.  Abdominal:     General: Bowel sounds are normal.     Palpations: Abdomen is soft.  Musculoskeletal:     Cervical back: Normal  range of motion.  Lymphadenopathy:     Cervical: No cervical adenopathy.  Skin:    General: Skin is warm and dry.     Capillary Refill: Capillary refill takes less than 2 seconds.  Neurological:     Mental Status: She is alert and oriented to person, place, and time.  Psychiatric:        Behavior: Behavior normal.      Musculoskeletal Exam: She had a lateral rotation of the cervical spine.  Shoulder joints, elbow joints, wrist joints with good range of motion.  She had bilateral CMC PIP and DIP thickening.  Subluxation of PIP and DIP joints was noted.  Hip joints and knee joints with good range of motion.  She has  overcrowding of toes in her right foot.  Bilateral PIP and DIP thickening in her feet was noted.  CDAI Exam: CDAI Score: -- Patient Global: --; Provider Global: -- Swollen: --; Tender: -- Joint Exam 06/08/2022   No joint exam has been documented for this visit   There is currently no information documented on the homunculus. Go to the Rheumatology activity and complete the homunculus joint exam.  Investigation: No additional findings.  Imaging: US Guided Needle Placement  Result Date: 06/08/2022 Ultrasound guided injection is preferred based studies that show increased duration, increased effect, greater accuracy, decreased procedural pain, increased response rate, and decreased cost with ultrasound guided versus blind injection.   Verbal informed consent obtained.  Time-out conducted.  Noted no overlying erythema, induration, or other signs of local infection. Ultrasound-guided CMC joint injection:After sterile prep with Betadine, injected 0.5 mL of 1% lidocaine and 10 mg Kenalog using a 27-gauge needle, in the [facility] joint.    US Guided Needle Placement  Result Date: 06/08/2022 Ultrasound guided injection is preferred based studies that show increased duration, increased effect, greater accuracy, decreased procedural pain, increased response rate, and decreased cost with  ultrasound guided versus blind injection.   Verbal informed consent obtained.  Time-out conducted.  Noted no overlying erythema, induration, or other signs of local infection. Ultrasound-guided CMC joint injection:After sterile prep with Betadine, injected 0.5 mL of 1% lidocaine and 10 mg Kenalog using a 27-gauge needle, in the [facility] joint.     Recent Labs: Lab Results  Component Value Date   WBC 5.5 05/14/2022   HGB 12.6 05/14/2022   PLT 246.0 05/14/2022   NA 141 05/14/2022   K 4.1 05/14/2022   CL 104 05/14/2022   CO2 29 05/14/2022   GLUCOSE 98 05/14/2022   BUN 22 05/14/2022   CREATININE 0.83 05/14/2022   BILITOT 0.7 05/14/2022   ALKPHOS 66 05/14/2022   AST 24 05/14/2022   ALT 19 05/14/2022   PROT 6.8 05/14/2022   ALBUMIN 4.1 05/14/2022   CALCIUM 9.5 05/14/2022   GFRAA >60 04/21/2020    Speciality Comments: No specialty comments available.  Procedures:  Small Joint Inj: bilateral thumb CMC on 06/08/2022 11:52 AM Indications: pain Details: 27 G needle, ultrasound-guided radial approach  Spinal Needle: No  Medications (Right): 0.5 mL lidocaine 1 %; 40 mg triamcinolone acetonide 40 MG/ML Aspirate (Right): 0 mL Medications (Left): 0.5 mL lidocaine 1 %; 40 mg triamcinolone acetonide 40 MG/ML Aspirate (Left): 0 mL Outcome: tolerated well, no immediate complications Procedure, treatment alternatives, risks and benefits explained, specific risks discussed. Consent was given by the patient. Immediately prior to procedure a time out was called to verify the correct patient, procedure, equipment, support staff and site/side marked as required. Patient was prepped and draped in the usual sterile fashion.     Allergies: Clindamycin/lincomycin and Trazodone and nefazodone   Assessment / Plan:     Visit Diagnoses: Arthritis of carpometacarpal (CMC) joint of both thumbs -continues to have pain and discomfort in her bilateral hands over the [facility] joints.  She had good response to cortisone  injections.  She is requesting to have repeat cortisone injections to the [facility] joints.  Side effects were discussed.  After informed consent was obtained bilateral CMC joints were injected with lidocaine and cortisone as described above.  Patient tolerated the procedure well.  Postprocedure instructions were given.  Use of CMC braces was discussed.  Plan: US Guided Needle Placement, US Guided Needle Placement  Primary osteoarthritis of both hands -  she has severe osteoarthritis involving bilaterally hands.  She has severe osteoarthritis in her hands with DIP and PIP thickening.  Joint protection muscle strengthening was discussed.  Primary osteoarthritis of both knees-she denies any discomfort currently.  Primary osteoarthritis of both feet-she has overcrowding of the toes and PIP and DIP thickening.  Proper fitting shoes were discussed.  Rheumatoid factor positive-she had no synovitis on examination.  Other medical problems are listed as follows:  Primary insomnia-good sleep hygiene was discussed.  History of hypertension-her systolic blood pressure was elevated.  She was advised to monitor blood pressure closely and follow-up with her PCP.  History of gastroesophageal reflux (GERD)  History of coronary artery disease  History of hyperlipidemia  History of adenomatous polyp of colon  History of depression  Screening for osteoporosis - DEXA scan on May 20, 2021 showed T score of -0.4 in the lumbar region.  Orders: Orders Placed This Encounter  Procedures   Small Joint Inj   US Guided Needle Placement   US Guided Needle Placement   No orders of the defined types were placed in this encounter.    Follow-Up Instructions: Return in about 6 months (around 12/08/2022) for Osteoarthritis.   Bo Merino, MD  Note - This record has been created using Editor, commissioning.  Chart creation errors have been sought, but may not always  have been located. Such creation errors do not  reflect on  the standard of medical care.

## 2022-06-08 ENCOUNTER — Encounter: Payer: Self-pay | Admitting: Rheumatology

## 2022-06-08 ENCOUNTER — Ambulatory Visit (INDEPENDENT_AMBULATORY_CARE_PROVIDER_SITE_OTHER): Payer: Medicare HMO

## 2022-06-08 ENCOUNTER — Ambulatory Visit: Payer: Medicare HMO | Admitting: Rheumatology

## 2022-06-08 ENCOUNTER — Ambulatory Visit: Payer: Self-pay

## 2022-06-08 VITALS — BP 150/73 | HR 65 | Resp 13 | Ht 63.0 in | Wt 198.0 lb

## 2022-06-08 DIAGNOSIS — M180 Bilateral primary osteoarthritis of first carpometacarpal joints: Secondary | ICD-10-CM | POA: Diagnosis not present

## 2022-06-08 DIAGNOSIS — F5101 Primary insomnia: Secondary | ICD-10-CM

## 2022-06-08 DIAGNOSIS — Z8639 Personal history of other endocrine, nutritional and metabolic disease: Secondary | ICD-10-CM | POA: Diagnosis not present

## 2022-06-08 DIAGNOSIS — M19072 Primary osteoarthritis, left ankle and foot: Secondary | ICD-10-CM

## 2022-06-08 DIAGNOSIS — Z86010 Personal history of colonic polyps: Secondary | ICD-10-CM | POA: Diagnosis not present

## 2022-06-08 DIAGNOSIS — M19071 Primary osteoarthritis, right ankle and foot: Secondary | ICD-10-CM | POA: Diagnosis not present

## 2022-06-08 DIAGNOSIS — Z860101 Personal history of adenomatous and serrated colon polyps: Secondary | ICD-10-CM

## 2022-06-08 DIAGNOSIS — R69 Illness, unspecified: Secondary | ICD-10-CM | POA: Diagnosis not present

## 2022-06-08 DIAGNOSIS — M170 Bilateral primary osteoarthritis of knee: Secondary | ICD-10-CM

## 2022-06-08 DIAGNOSIS — Z8719 Personal history of other diseases of the digestive system: Secondary | ICD-10-CM

## 2022-06-08 DIAGNOSIS — M19041 Primary osteoarthritis, right hand: Secondary | ICD-10-CM

## 2022-06-08 DIAGNOSIS — Z13820 Encounter for screening for osteoporosis: Secondary | ICD-10-CM | POA: Diagnosis not present

## 2022-06-08 DIAGNOSIS — Z8659 Personal history of other mental and behavioral disorders: Secondary | ICD-10-CM | POA: Diagnosis not present

## 2022-06-08 DIAGNOSIS — R7689 Other specified abnormal immunological findings in serum: Secondary | ICD-10-CM

## 2022-06-08 DIAGNOSIS — Z8679 Personal history of other diseases of the circulatory system: Secondary | ICD-10-CM | POA: Diagnosis not present

## 2022-06-08 DIAGNOSIS — R768 Other specified abnormal immunological findings in serum: Secondary | ICD-10-CM

## 2022-06-08 DIAGNOSIS — M19042 Primary osteoarthritis, left hand: Secondary | ICD-10-CM

## 2022-06-08 MED ORDER — LIDOCAINE HCL 1 % IJ SOLN
0.5000 mL | INTRAMUSCULAR | Status: AC | PRN
  Administered 2022-06-08: .5 mL

## 2022-06-08 MED ORDER — TRIAMCINOLONE ACETONIDE 40 MG/ML IJ SUSP
40.0000 mg | INTRAMUSCULAR | Status: AC | PRN
  Administered 2022-06-08: 40 mg via INTRA_ARTICULAR

## 2022-06-14 ENCOUNTER — Encounter: Payer: Self-pay | Admitting: Family Medicine

## 2022-06-14 DIAGNOSIS — Z0100 Encounter for examination of eyes and vision without abnormal findings: Secondary | ICD-10-CM | POA: Diagnosis not present

## 2022-07-14 ENCOUNTER — Ambulatory Visit (HOSPITAL_COMMUNITY)
Admission: RE | Admit: 2022-07-14 | Discharge: 2022-07-14 | Disposition: A | Discharge status: Home / Self Care | Payer: Medicare HMO | Source: Ambulatory Visit | Attending: Cardiovascular Disease | Admitting: Cardiovascular Disease

## 2022-07-14 DIAGNOSIS — I6523 Occlusion and stenosis of bilateral carotid arteries: Secondary | ICD-10-CM | POA: Diagnosis not present

## 2022-07-29 ENCOUNTER — Ambulatory Visit: Payer: Medicare HMO | Admitting: Physician Assistant

## 2022-08-01 ENCOUNTER — Encounter: Payer: Self-pay | Admitting: Family Medicine

## 2022-09-05 IMAGING — MG MM DIGITAL SCREENING BILAT W/ TOMO AND CAD
8 series · 8 of 24 positions shown · non-contrast
Comparison: Previous exam(s).

CLINICAL DATA: Screening.

EXAM:
DIGITAL SCREENING BILATERAL MAMMOGRAM WITH TOMOSYNTHESIS AND CAD
TECHNIQUE: Bilateral screening digital craniocaudal and mediolateral oblique
mammograms were obtained. Bilateral screening digital breast
tomosynthesis was performed. The images were evaluated with
computer-aided detection.

[R MLO synth-2D]
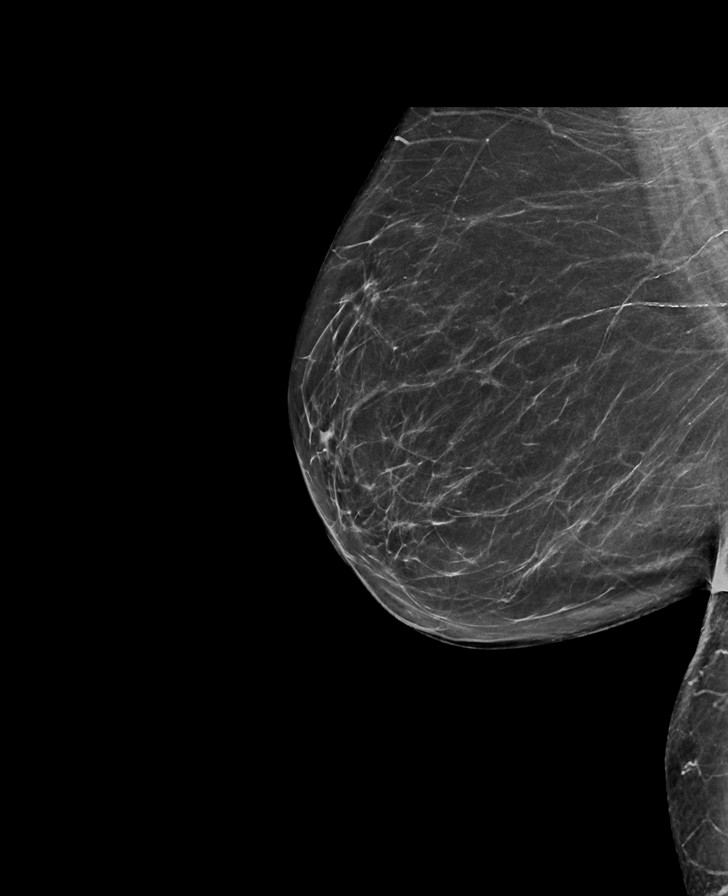

[R CC synth-2D]
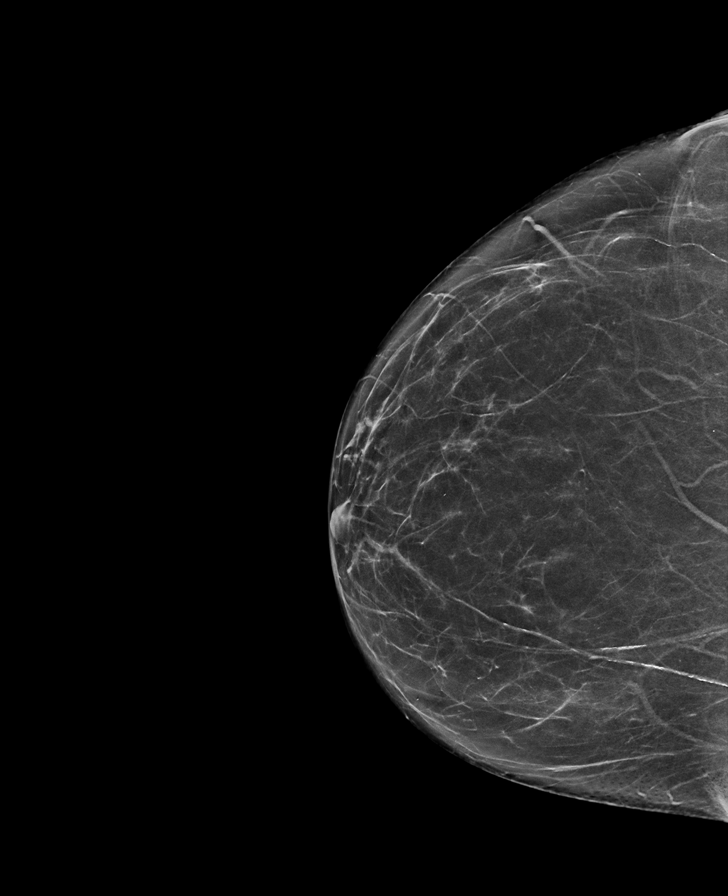

[L MLO synth-2D]
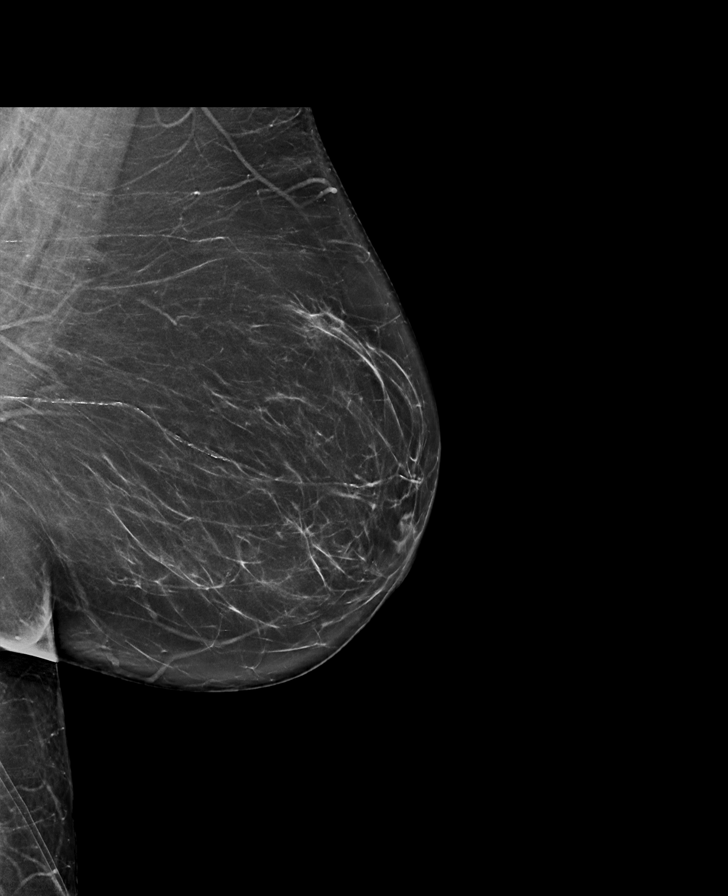

[L CC synth-2D]
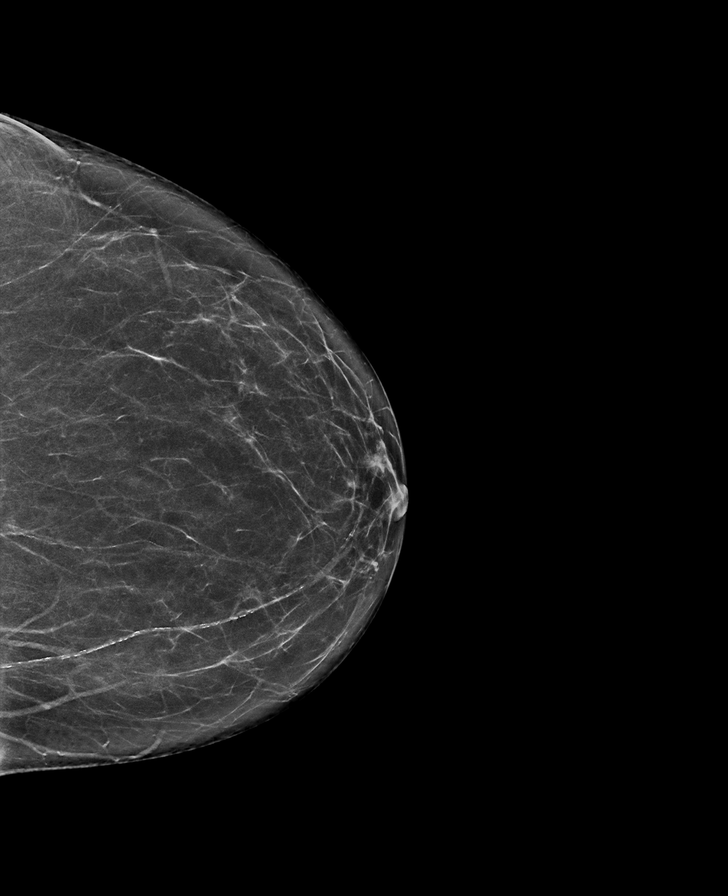

[R MLO tomo · tomo slice 37/74.0]
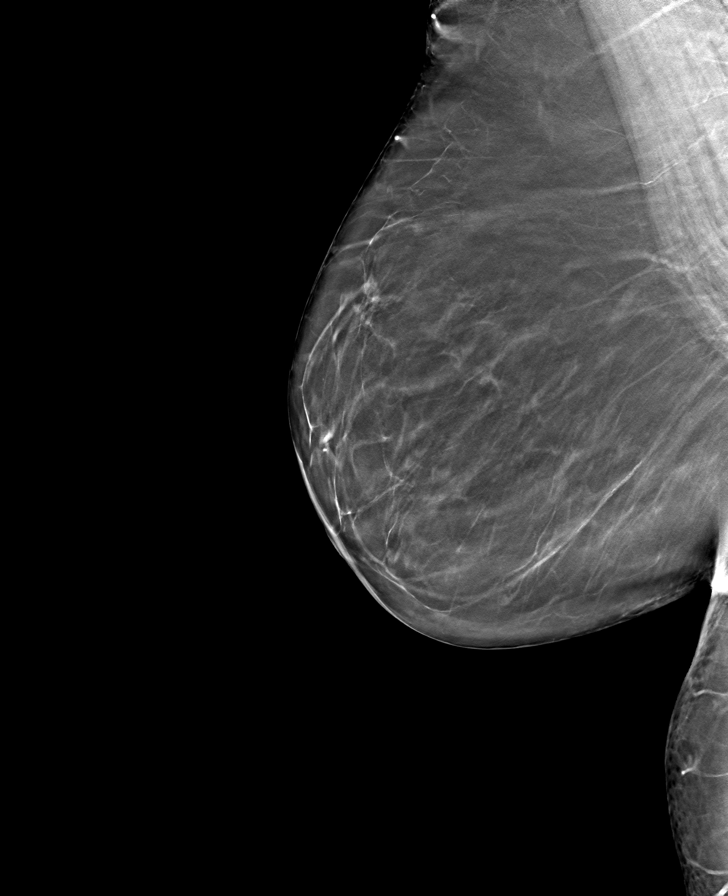

[L MLO tomo · tomo slice 39/77.0]
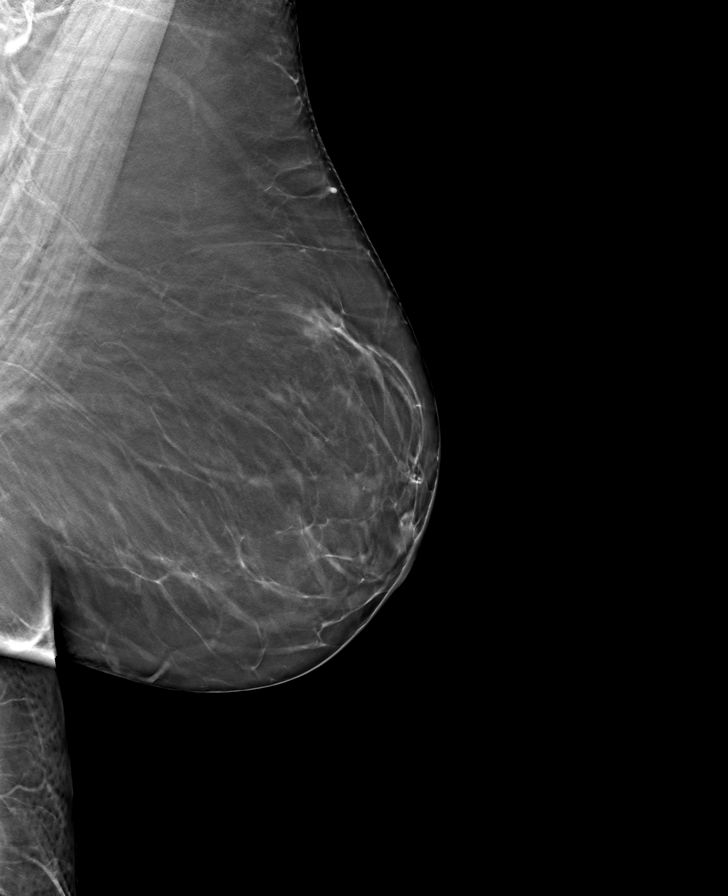

[L CC tomo · tomo slice 35/69.0]
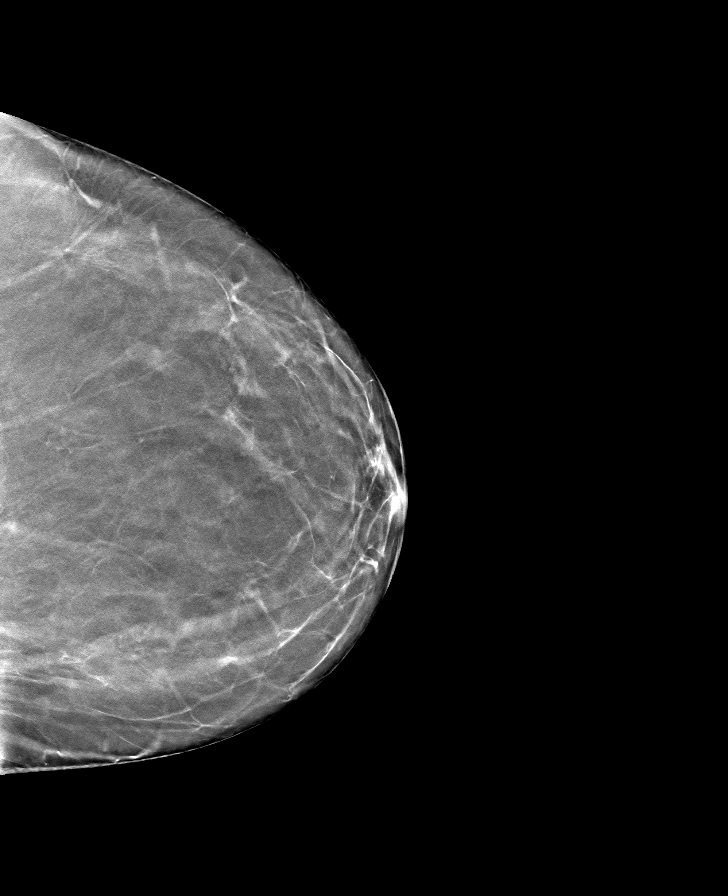

[R CC tomo · tomo slice 35/68.0]
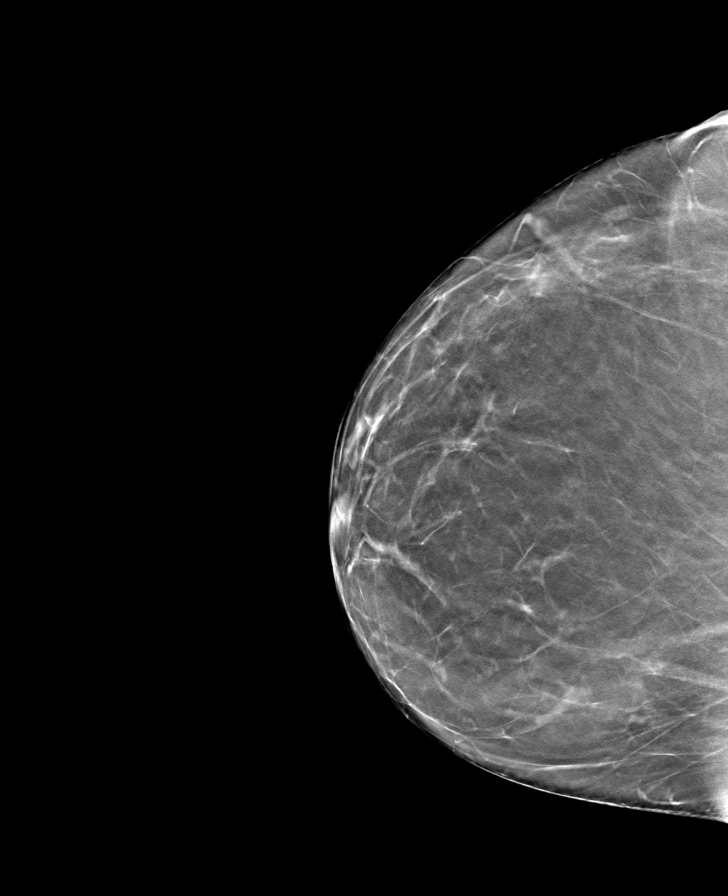

[8 of 24 positions shown; findings below may reference images not displayed]

ACR Breast Density Category b: There are scattered areas of
fibroglandular density.
FINDINGS: There are no findings suspicious for malignancy. The images were
evaluated with computer-aided detection.
IMPRESSION: No mammographic evidence of malignancy. A result letter of this
screening mammogram will be mailed directly to the patient.

RECOMMENDATION:
Screening mammogram in one year. (Code:[id number])

BI-RADS CATEGORY  1: Negative.

## 2022-09-14 ENCOUNTER — Encounter: Payer: Self-pay | Admitting: Family Medicine

## 2022-09-22 ENCOUNTER — Other Ambulatory Visit (HOSPITAL_COMMUNITY): Payer: Self-pay | Admitting: Physician Assistant

## 2022-09-22 DIAGNOSIS — I779 Disorder of arteries and arterioles, unspecified: Secondary | ICD-10-CM

## 2022-11-03 ENCOUNTER — Other Ambulatory Visit: Payer: Self-pay | Admitting: Family Medicine

## 2022-11-10 ENCOUNTER — Ambulatory Visit (INDEPENDENT_AMBULATORY_CARE_PROVIDER_SITE_OTHER): Payer: Medicare HMO | Admitting: Family Medicine

## 2022-11-10 ENCOUNTER — Encounter: Payer: Self-pay | Admitting: Family Medicine

## 2022-11-10 VITALS — BP 130/76 | HR 78 | Temp 98.1°F | Ht 63.0 in | Wt 195.4 lb

## 2022-11-10 DIAGNOSIS — I2510 Atherosclerotic heart disease of native coronary artery without angina pectoris: Secondary | ICD-10-CM | POA: Diagnosis not present

## 2022-11-10 DIAGNOSIS — R69 Illness, unspecified: Secondary | ICD-10-CM | POA: Diagnosis not present

## 2022-11-10 DIAGNOSIS — R739 Hyperglycemia, unspecified: Secondary | ICD-10-CM | POA: Diagnosis not present

## 2022-11-10 DIAGNOSIS — I10 Essential (primary) hypertension: Secondary | ICD-10-CM | POA: Diagnosis not present

## 2022-11-10 DIAGNOSIS — F325 Major depressive disorder, single episode, in full remission: Secondary | ICD-10-CM | POA: Diagnosis not present

## 2022-11-10 DIAGNOSIS — K219 Gastro-esophageal reflux disease without esophagitis: Secondary | ICD-10-CM | POA: Diagnosis not present

## 2022-11-10 LAB — POCT GLYCOSYLATED HEMOGLOBIN (HGB A1C): Hemoglobin A1C: 5.5 % (ref 4.0–5.6)

## 2022-11-10 MED ORDER — ESCITALOPRAM OXALATE 20 MG PO TABS
20.0000 mg | ORAL_TABLET | Freq: Every day | ORAL | 3 refills | Status: DC
Start: 2022-11-10 — End: 2024-02-13

## 2022-11-10 MED ORDER — OMEPRAZOLE 40 MG PO CPDR: 40.0000 mg | DELAYED_RELEASE_CAPSULE | Freq: Two times a day (BID) | ORAL | 3 refills | Status: DC

## 2022-11-10 MED ORDER — AMLODIPINE BESYLATE 2.5 MG PO TABS
2.5000 mg | ORAL_TABLET | Freq: Every day | ORAL | 3 refills | Status: DC
Start: 2022-11-10 — End: 2023-06-01

## 2022-11-10 MED ORDER — ATORVASTATIN CALCIUM 80 MG PO TABS: 80.0000 mg | ORAL_TABLET | Freq: Every evening | ORAL | 3 refills | Status: DC

## 2022-11-10 NOTE — Patient Instructions (Addendum)
Health Maintenance Due  Topic Date Due   Medicare Annual Wellness (AWV)  06/16/2019  You are eligible to schedule your annual wellness visit with our nurse specialist Otila Kluver.  Please consider scheduling this before you leave today  POC a1c today- as long as 5.3 or less you are good today to leave and we will consider roughly   Recommended follow up: Return in about 6 months (around 05/11/2023) for physical or sooner if needed.Schedule b4 you leave.

## 2022-11-10 NOTE — Progress Notes (Signed)
Phone [phone] In person visit   Subjective:   [name] is a 81 y.o. year old very pleasant female patient who presents for/with See problem oriented charting Chief Complaint  Patient presents with   Follow-up   dry throat    Pt c/o dry throat/cough and wants to know if another endoscopy is needed.   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   Primary osteoarthritis of both knees 03/10/2017    Priority: 1.   Primary osteoarthritis of both hands 03/09/2017    Priority: 1.   Primary osteoarthritis of both feet 03/09/2017    Priority: 1.   Diaphoresis 05/12/2018   Rheumatoid factor positive 03/09/2017    Medications- reviewed and updated Current Outpatient Medications  Medication Sig Dispense Refill   aspirin 81 MG tablet Take 81 mg by mouth daily.      Cholecalciferol (VITAMIN D3) 50 MCG (2000 UT) TABS Take 1 tablet by mouth daily.     diclofenac sodium (VOLTAREN) 1 % GEL Apply 3 gm to 3 large joints up to 3 times a day.Dispense 3  tubes with 3 refills. 3 Tube 3   vitamin B-12 (CYANOCOBALAMIN) 1000 MCG tablet Take 1,000 mcg by mouth daily.     amLODipine (NORVASC) 2.5 MG tablet Take 1 tablet (2.5 mg total) by mouth daily. 90 tablet 3   atorvastatin (LIPITOR) 80 MG tablet Take 1 tablet (80 mg total) by mouth every evening. 90 tablet 3   escitalopram (LEXAPRO) 20 MG tablet Take 1 tablet (20 mg total) by mouth daily. 90 tablet 3   omeprazole (PRILOSEC) 40 MG capsule Take 1 capsule (40 mg total) by mouth in the morning and at bedtime. 90 capsule 3   No current facility-administered medications for this visit.     Objective:  BP 130/76   Pulse 78   Temp 98.1 F (36.7 C)   Ht '5\' 3"'$  (1.6 m)   Wt 195 lb 6.4 oz (88.6 kg)   SpO2 96%   BMI 34.61 kg/m  Gen: NAD, resting comfortably CV: RRR no murmurs rubs or gallops Lungs: CTAB no crackles, wheeze, rhonchi Ext: trace edema Skin: warm, dry     Assessment and Plan   #Dry throat/trouble swallowing/GERD S: Patient with some trouble swallowing in the past and in 2022 had EGD which was largely reassuring.  No further endoscopic evaluation  was recommended. - Persistent issues and wants to know if she should pursue repeat -also gets dry cough as well. No recent chest x-ray and dry cough only fror 2 weeks -some trouble with pills  -quit smoking in 1980  If eats after 5 pm notes reflux- so she has pulled to avoid. Still on omeprazole 20 mg daily A/P: GERD seems to be poorly controlled and wonder if contributing to dry throat/trouble swallowing issues- had EGD within 18 months- if this fails to improve issues she agrees to call GI back early next year to have repeat visit    #Nonobstructive CAD #hyperlipidemia S: Medication:Atorvastatin 80 mg, aspirin 81 mg No chest pain or shortness of breath Lab Results  Component Value Date   CHOL 142 05/14/2022   HDL 61.20 05/14/2022   LDLCALC 67 05/14/2022   LDLDIRECT 65.0 05/30/2020   TRIG 72.0 05/14/2022   CHOLHDL 2 05/14/2022   A/P: nonobstructive CAD asymptomatic- continue current meds Lipids at goal- continue atorvastatin- recheck next visit   #hypertension S: medication: Amlodipine 2.5 mg Home readings #s: have bene running higher BUT we adjusted how she was taking in office today (wrist was down and was wrist cuff and got 131/76- very close to our reading) -ymca regularly- loves classes Monday and Friday in particular. Up to 8 lb weights in the gym BP Readings from Last 3 Encounters:  11/10/22 130/76  06/08/22 (!) 150/73  05/10/22 110/64  A/P: Controlled. Continue current medications.   # Depression S: Medication:Lexapro 20 mg    11/10/2022   11:08 AM 05/10/2022    1:01 PM 11/10/2021    1:15 PM  Depression screen PHQ 2/9  Decreased Interest 0 0 0  Down, Depressed, Hopeless 0 0 0  PHQ - 2 Score 0 0 0  Altered sleeping 0 3 0  Tired, decreased energy 0 0 3  Change in appetite 0 0 0  Feeling bad or failure about yourself  0 0 0  Trouble concentrating 0 0 0  Moving slowly or fidgety/restless 0 0 0  Suicidal thoughts 0 0 0  PHQ-9 Score 0 3 3  Difficult doing work/chores Not difficult at all Not difficult at all Not difficult at all  A/P: furll remission- continue current meds- considered 10 mg dose but she would like to see how things go over winter before deciding   Recommended follow up: Return in about 6 months (around 05/11/2023) for physical or sooner if needed.Schedule b4 you leave. Future Appointments  Date Time Provider [place]  12/08/2022 11:20 AM Bo Merino, MD [facility] None   Lab/Order associations:   ICD-10-CM   1. Coronary artery disease involving native coronary artery of native heart without angina pectoris  I25.10     2. Gastroesophageal reflux disease without esophagitis  K21.9 omeprazole (PRILOSEC) 40 MG capsule    3. Major depressive disorder with single episode, in full remission ([place])  F32.5     4. Essential hypertension  I10       Meds ordered this  encounter  Medications   omeprazole (PRILOSEC) 40 MG capsule    Sig: Take 1 capsule (40 mg total) by mouth in the morning and at bedtime.    Dispense:  90 capsule    Refill:  3   amLODipine (NORVASC) 2.5 MG tablet    Sig: Take 1 tablet (2.5 mg total) by mouth daily.    Dispense:  90 tablet    Refill:  3   atorvastatin (LIPITOR) 80 MG tablet  Sig: Take 1 tablet (80 mg total) by mouth every evening.    Dispense:  90 tablet    Refill:  3   escitalopram (LEXAPRO) 20 MG tablet    Sig: Take 1 tablet (20 mg total) by mouth daily.    Dispense:  90 tablet    Refill:  3    Return precautions advised.  Garret Reddish, MD

## 2022-12-08 ENCOUNTER — Ambulatory Visit: Payer: Medicare HMO | Admitting: Rheumatology

## 2023-01-31 DIAGNOSIS — Z8249 Family history of ischemic heart disease and other diseases of the circulatory system: Secondary | ICD-10-CM | POA: Diagnosis not present

## 2023-01-31 DIAGNOSIS — K219 Gastro-esophageal reflux disease without esophagitis: Secondary | ICD-10-CM | POA: Diagnosis not present

## 2023-01-31 DIAGNOSIS — Z791 Long term (current) use of non-steroidal anti-inflammatories (NSAID): Secondary | ICD-10-CM | POA: Diagnosis not present

## 2023-01-31 DIAGNOSIS — F3341 Major depressive disorder, recurrent, in partial remission: Secondary | ICD-10-CM | POA: Diagnosis not present

## 2023-01-31 DIAGNOSIS — Z803 Family history of malignant neoplasm of breast: Secondary | ICD-10-CM | POA: Diagnosis not present

## 2023-01-31 DIAGNOSIS — M1990 Unspecified osteoarthritis, unspecified site: Secondary | ICD-10-CM | POA: Diagnosis not present

## 2023-01-31 DIAGNOSIS — Z87891 Personal history of nicotine dependence: Secondary | ICD-10-CM | POA: Diagnosis not present

## 2023-01-31 DIAGNOSIS — I10 Essential (primary) hypertension: Secondary | ICD-10-CM | POA: Diagnosis not present

## 2023-01-31 DIAGNOSIS — I2510 Atherosclerotic heart disease of native coronary artery without angina pectoris: Secondary | ICD-10-CM | POA: Diagnosis not present

## 2023-01-31 DIAGNOSIS — E1159 Type 2 diabetes mellitus with other circulatory complications: Secondary | ICD-10-CM | POA: Diagnosis not present

## 2023-01-31 DIAGNOSIS — E1151 Type 2 diabetes mellitus with diabetic peripheral angiopathy without gangrene: Secondary | ICD-10-CM | POA: Diagnosis not present

## 2023-01-31 DIAGNOSIS — E785 Hyperlipidemia, unspecified: Secondary | ICD-10-CM | POA: Diagnosis not present

## 2023-01-31 DIAGNOSIS — R69 Illness, unspecified: Secondary | ICD-10-CM | POA: Diagnosis not present

## 2023-02-07 DIAGNOSIS — L814 Other melanin hyperpigmentation: Secondary | ICD-10-CM | POA: Diagnosis not present

## 2023-02-07 DIAGNOSIS — L570 Actinic keratosis: Secondary | ICD-10-CM | POA: Diagnosis not present

## 2023-02-07 DIAGNOSIS — D692 Other nonthrombocytopenic purpura: Secondary | ICD-10-CM | POA: Diagnosis not present

## 2023-02-07 DIAGNOSIS — D225 Melanocytic nevi of trunk: Secondary | ICD-10-CM | POA: Diagnosis not present

## 2023-02-07 DIAGNOSIS — I8312 Varicose veins of left lower extremity with inflammation: Secondary | ICD-10-CM | POA: Diagnosis not present

## 2023-02-07 DIAGNOSIS — L905 Scar conditions and fibrosis of skin: Secondary | ICD-10-CM | POA: Diagnosis not present

## 2023-02-07 DIAGNOSIS — L218 Other seborrheic dermatitis: Secondary | ICD-10-CM | POA: Diagnosis not present

## 2023-02-07 DIAGNOSIS — I872 Venous insufficiency (chronic) (peripheral): Secondary | ICD-10-CM | POA: Diagnosis not present

## 2023-02-07 DIAGNOSIS — I8311 Varicose veins of right lower extremity with inflammation: Secondary | ICD-10-CM | POA: Diagnosis not present

## 2023-02-07 DIAGNOSIS — D485 Neoplasm of uncertain behavior of skin: Secondary | ICD-10-CM | POA: Diagnosis not present

## 2023-02-07 DIAGNOSIS — L821 Other seborrheic keratosis: Secondary | ICD-10-CM | POA: Diagnosis not present

## 2023-02-22 ENCOUNTER — Ambulatory Visit: Payer: Medicare HMO | Admitting: Physician Assistant

## 2023-02-28 NOTE — Progress Notes (Deleted)
Office Visit Note  Patient: [name]             Date of Birth: [birth year]           MRN: [id number]             PCP: Marin Olp, MD Referring: Marin Olp, MD Visit Date: 03/03/2023 Occupation: '@GUAROCC'$ @  Subjective:  No chief complaint on file.   History of Present Illness: MANEH BA is a 82 y.o. female ***     Activities of Daily Living:  Patient reports morning stiffness for *** {minute/hour:19697}.   Patient {ACTIONS;DENIES/REPORTS:21021675::"Denies"} nocturnal pain.  Difficulty dressing/grooming: {ACTIONS;DENIES/REPORTS:21021675::"Denies"} Difficulty climbing stairs: {ACTIONS;DENIES/REPORTS:21021675::"Denies"} Difficulty getting out of chair: {ACTIONS;DENIES/REPORTS:21021675::"Denies"} Difficulty using hands for taps, buttons, cutlery, and/or writing: {ACTIONS;DENIES/REPORTS:21021675::"Denies"}  No Rheumatology ROS completed.   PMFS History:  Patient Active Problem List   Diagnosis Date Noted   Itchy skin 05/30/2020   Diaphoresis 05/12/2018   Trigger finger, right middle finger 09/05/2017   History of adenomatous polyp of colon 08/01/2017   Primary osteoarthritis of both knees 03/10/2017   Primary osteoarthritis of both hands 03/09/2017   Primary osteoarthritis of both feet 03/09/2017   Rheumatoid factor positive 03/09/2017   Advanced directives, counseling/discussion 01/26/2017   nonobstructive CAD (coronary artery disease) 12/01/2015   Varicose veins of both lower extremities 12/01/2015   Insomnia 12/01/2015   Glaucoma 12/01/2015   Memory loss 12/01/2015   Hammer toe    Arthritis 08/29/2012   GERD 09/06/2009   Edema 09/06/2009   History of syncope 09/06/2009   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 Estanislado Pandy   CAD (coronary artery disease)    nonobstructive   Depression    Exertional dyspnea 08/29/2012   GERD (gastroesophageal reflux disease)    Hammer toe    surgery x2, second with Dr. Sharol Given- only other option amputatoin   HLD (hyperlipidemia)    HTN (hypertension)    PNEUMONIA 07/04/2007   Qualifier: Diagnosis of  By: Larose Kells MD, [place]    Syncope and collapse 08/29/2012   "first time ever"    Family History  Problem Relation Age of Onset   Heart disease Mother        brother, father   Diabetes Mother        sister, brother   Suicidality Father        67   Coronary artery disease Brother    Colon cancer Brother    Stroke Brother    Alcoholism Brother    Ovarian cancer Daughter    Esophageal cancer Neg Hx    Rectal cancer Neg Hx    Stomach cancer Neg Hx    Liver cancer Neg Hx    Past Surgical History:  Procedure Laterality Date   ABDOMINAL HYSTERECTOMY  1970's   APPENDECTOMY  ~ 1970   CATARACT EXTRACTION W/ INTRAOCULAR LENS  IMPLANT, BILATERAL  ~ 03/19/2009   bilateral   TONSILLECTOMY AND ADENOIDECTOMY  1950's   VEIN LIGATION AND STRIPPING  2012   bilaterally   Social History   Social History Narrative   Widowed 2013/10/19. Daughter died 03/19/2012. 2 living children. 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      Hobbies: gardening, time with dog, time with grandkids   Immunization History  Administered Date(s) Administered   Fluad Quad(high Dose 65+) 09/25/2019, 10/07/2020   Influenza, High Dose Seasonal PF 08/24/2016, 09/28/2018, 09/17/2022   Influenza-Unspecified 11/12/2015, 09/25/2019, 09/03/2021   PFIZER Comirnaty(Gray Top)Covid-19 Tri-Sucrose Vaccine 02/17/2021, 09/22/2022   PFIZER(Purple Top)SARS-COV-2 Vaccination 01/21/2020, 02/11/2020, 08/19/2020, 11/13/2021   PNEUMOCOCCAL CONJUGATE-20 09/22/2022   PPD Test  05/06/2021   Pneumococcal Conjugate-13 08/24/2016   Pneumococcal Polysaccharide-23 12/29/1999   Respiratory Syncytial Virus Vaccine,Recomb Aduvanted(Arexvy) 09/17/2022   Td 12/28/2005, 10/26/2016   Zoster Recombinat (Shingrix) 12/01/2021, 02/12/2022     Objective: Vital Signs: There were no vitals taken for this visit.   Physical Exam   Musculoskeletal Exam: ***  CDAI Exam: CDAI Score: -- Patient Global: --; Provider Global: -- Swollen: --; Tender: -- Joint Exam 03/03/2023   No joint exam has been documented for this visit   There is currently no information documented on the homunculus. Go to the Rheumatology activity and complete the homunculus joint exam.  Investigation: No additional findings.  Imaging: No results found.  Recent Labs: Lab Results  Component Value Date   WBC 5.5 05/14/2022   HGB 12.6 05/14/2022   PLT 246.0 05/14/2022   NA 141 05/14/2022   K 4.1 05/14/2022   CL 104 05/14/2022   CO2 29 05/14/2022   GLUCOSE 98 05/14/2022   BUN 22 05/14/2022   CREATININE 0.83 05/14/2022   BILITOT 0.7 05/14/2022   ALKPHOS 66 05/14/2022   AST 24 05/14/2022   ALT 19 05/14/2022   PROT 6.8 05/14/2022   ALBUMIN 4.1 05/14/2022   CALCIUM 9.5 05/14/2022   GFRAA >60 04/21/2020    Speciality Comments: No specialty comments available.  Procedures:  No procedures performed Allergies: Clindamycin/lincomycin and Trazodone and nefazodone   Assessment / Plan:     Visit Diagnoses: Arthritis of carpometacarpal (CMC) joint of both thumbs  Primary osteoarthritis of both hands  Primary osteoarthritis of both knees  Primary osteoarthritis of both feet  Rheumatoid factor positive  Primary insomnia  History of hypertension  History of gastroesophageal reflux (GERD)  History of coronary artery disease  History of hyperlipidemia  History of adenomatous polyp of colon  History of depression  Orders: No orders of the defined types were placed in this  encounter.  No orders of the defined types were placed in this encounter.   Face-to-face time spent with patient was *** minutes. Greater than 50% of time was spent in counseling and coordination of care.  Follow-Up Instructions: No follow-ups on file.   Ofilia Neas, PA-C  Note - This record has been created using Dragon software.  Chart creation errors have been sought, but may not always  have been located. Such creation errors do not reflect on  the standard of medical care.

## 2023-03-02 ENCOUNTER — Ambulatory Visit (INDEPENDENT_AMBULATORY_CARE_PROVIDER_SITE_OTHER): Payer: Medicare HMO | Admitting: Family

## 2023-03-02 ENCOUNTER — Encounter: Payer: Self-pay | Admitting: Family

## 2023-03-02 VITALS — BP 167/75 | HR 77 | Temp 98.1°F | Ht 63.0 in | Wt 195.4 lb

## 2023-03-02 DIAGNOSIS — U071 COVID-19: Secondary | ICD-10-CM

## 2023-03-02 LAB — POC COVID19 BINAXNOW: SARS Coronavirus 2 Ag: POSITIVE — AB

## 2023-03-02 MED ORDER — NIRMATRELVIR/RITONAVIR (PAXLOVID)TABLET: 3.0000 | ORAL_TABLET | Freq: Two times a day (BID) | ORAL | 0 refills | Status: AC

## 2023-03-02 NOTE — Progress Notes (Signed)
Patient ID: [name], female    DOB: 12/20/1941, 82 y.o.   MRN: [id number]  Chief Complaint  Patient presents with   Covid Exposure    Pt c/o sore throat, chills, body aches, headaches. Symptoms started this morning. Has tried cough syrup which did help her sleep. Exposure to covid this past weekend.     HPI:      URI sx:  Pt c/o sore throat, chills, body aches, headaches. Symptoms started this morning. Has tried cough syrup which did help her sleep. Exposure to covid this past weekend, has not tested at home, has never been positive in past.      Assessment & Plan:  1. COVID-19 Sending Paxlovid, pt advised how to take, & SE. Advised to hold her Lipitor while taking. Advised of CDC guidelines for masking if out in public. OK to continue taking OTC sinus or pain meds. Encouraged to monitor & notify office of any worsening symptoms: increased shortness of breath, weakness, and signs of dehydration. Instructed to rest and hydrate well.   - POC COVID-19 - nirmatrelvir/ritonavir (PAXLOVID) 20 x 150 MG & 10 x '100MG'$  TABS; Take 3 tablets by mouth 2 (two) times daily for 5 days. (Take nirmatrelvir 150 mg two tablets twice daily for 5 days and ritonavir 100 mg one tablet twice daily for 5 days) Patient GFR is 68.  Dispense: 30 tablet; Refill: 0  Subjective:    Outpatient Medications Prior to Visit  Medication Sig Dispense Refill   amLODipine (NORVASC) 2.5 MG tablet Take 1 tablet (2.5 mg total) by mouth daily. 90 tablet 3   aspirin 81 MG tablet Take 81 mg by mouth daily.      atorvastatin (LIPITOR) 80 MG tablet Take 1 tablet (80 mg total) by mouth every evening. 90 tablet 3   Cholecalciferol (VITAMIN D3) 50 MCG (2000 UT) TABS Take 1 tablet by mouth daily.     diclofenac sodium (VOLTAREN) 1 % GEL Apply 3 gm to 3 large joints up to 3 times a day.Dispense 3 tubes with 3 refills. 3 Tube 3   escitalopram (LEXAPRO) 20 MG tablet Take 1 tablet (20 mg total) by mouth daily. 90 tablet 3    omeprazole (PRILOSEC) 40 MG capsule Take 1 capsule (40 mg total) by mouth in the morning and at bedtime. 90 capsule 3   vitamin B-12 (CYANOCOBALAMIN) 1000 MCG tablet Take 1,000 mcg by mouth daily.     No facility-administered medications prior to visit.   Past Medical History:  Diagnosis Date   Anginal pain ([place]) 08/29/2012   "have had problems w/this"   Anxiety 08/29/2012   Arthritis 08/29/2012   right hand, Seen by Dr. Estanislado Pandy   CAD (coronary artery disease)    nonobstructive   Depression    Exertional dyspnea 08/29/2012   GERD (gastroesophageal reflux disease)    Hammer toe    surgery x2, second with Dr. Sharol Given- only other option amputatoin   HLD (hyperlipidemia)    HTN (hypertension)    PNEUMONIA 07/04/2007   Qualifier: Diagnosis of  By: Larose Kells MD, [place]    Syncope and collapse 08/29/2012   "first time ever"   Past Surgical History:  Procedure Laterality Date   ABDOMINAL HYSTERECTOMY  1970's   APPENDECTOMY  ~ 1970   CATARACT EXTRACTION W/ INTRAOCULAR LENS  IMPLANT, BILATERAL  ~ 2010   bilateral   TONSILLECTOMY AND ADENOIDECTOMY  1950's   VEIN LIGATION AND STRIPPING  2012   bilaterally   Allergies  Allergen Reactions   Clindamycin/Lincomycin     Severe rash and into throat   Trazodone And Nefazodone Other (See Comments)    drowsiness      Objective:    Physical Exam Vitals and nursing note reviewed.  Constitutional:      Appearance: Normal appearance. She is not ill-appearing.     Interventions: Face mask in place.  HENT:     Right Ear: Tympanic membrane and ear canal normal.     Left Ear: Tympanic membrane and ear canal normal.     Nose:     Right Sinus: No frontal sinus tenderness.     Left Sinus: No frontal sinus tenderness.     Mouth/Throat:     Mouth: Mucous membranes are moist.     Pharynx: Posterior oropharyngeal erythema present. No pharyngeal swelling, oropharyngeal exudate or uvula swelling.     Tonsils: No tonsillar exudate or tonsillar abscesses.   Cardiovascular:     Rate and Rhythm: Normal rate and regular rhythm.  Pulmonary:     Effort: Pulmonary effort is normal.     Breath sounds: Normal breath sounds.  Musculoskeletal:        General: Normal range of motion.  Lymphadenopathy:     Head:     Right side of head: No preauricular or posterior auricular adenopathy.     Left side of head: No preauricular or posterior auricular adenopathy.     Cervical: No cervical adenopathy.  Skin:    General: Skin is warm and dry.  Neurological:     Mental Status: She is alert.  Psychiatric:        Mood and Affect: Mood normal.        Behavior: Behavior normal.    BP (!) 167/75 (BP Location: Left Arm, Patient Position: Sitting, Cuff Size: Large)   Pulse 77   Temp 98.1 F (36.7 C) (Temporal)   Ht '5\' 3"'$  (1.6 m)   Wt 195 lb 6 oz (88.6 kg)   SpO2 98%   BMI 34.61 kg/m  Wt Readings from Last 3 Encounters:  03/02/23 195 lb 6 oz (88.6 kg)  11/10/22 195 lb 6.4 oz (88.6 kg)  06/08/22 198 lb (89.8 kg)       Jeanie Sewer, NP

## 2023-03-03 ENCOUNTER — Ambulatory Visit: Payer: Medicare HMO | Admitting: Rheumatology

## 2023-03-03 DIAGNOSIS — Z13820 Encounter for screening for osteoporosis: Secondary | ICD-10-CM

## 2023-03-03 DIAGNOSIS — Z86010 Personal history of colonic polyps: Secondary | ICD-10-CM

## 2023-03-03 DIAGNOSIS — M170 Bilateral primary osteoarthritis of knee: Secondary | ICD-10-CM

## 2023-03-03 DIAGNOSIS — Z8659 Personal history of other mental and behavioral disorders: Secondary | ICD-10-CM

## 2023-03-03 DIAGNOSIS — M19041 Primary osteoarthritis, right hand: Secondary | ICD-10-CM

## 2023-03-03 DIAGNOSIS — Z8679 Personal history of other diseases of the circulatory system: Secondary | ICD-10-CM

## 2023-03-03 DIAGNOSIS — M19071 Primary osteoarthritis, right ankle and foot: Secondary | ICD-10-CM

## 2023-03-03 DIAGNOSIS — M180 Bilateral primary osteoarthritis of first carpometacarpal joints: Secondary | ICD-10-CM

## 2023-03-03 DIAGNOSIS — R768 Other specified abnormal immunological findings in serum: Secondary | ICD-10-CM

## 2023-03-03 DIAGNOSIS — Z8639 Personal history of other endocrine, nutritional and metabolic disease: Secondary | ICD-10-CM

## 2023-03-03 DIAGNOSIS — Z8719 Personal history of other diseases of the digestive system: Secondary | ICD-10-CM

## 2023-03-03 DIAGNOSIS — F5101 Primary insomnia: Secondary | ICD-10-CM

## 2023-03-07 ENCOUNTER — Telehealth: Payer: Self-pay | Admitting: Family Medicine

## 2023-03-07 NOTE — Telephone Encounter (Signed)
Spoke to pt, pt states she wanted to be retested for covid to go back to class at [employer], I informed pt that so she doesn't have to go into anyone office, she can go to pharmacy and test with the at home test. Pt verbalized understanding

## 2023-03-07 NOTE — Telephone Encounter (Signed)
Pt states she would like an RX to be sent to [facility] to retest for Covid. Fax to: [phone]. Please advise.

## 2023-03-24 DIAGNOSIS — H353131 Nonexudative age-related macular degeneration, bilateral, early dry stage: Secondary | ICD-10-CM | POA: Diagnosis not present

## 2023-03-24 DIAGNOSIS — H52203 Unspecified astigmatism, bilateral: Secondary | ICD-10-CM | POA: Diagnosis not present

## 2023-03-24 DIAGNOSIS — Z961 Presence of intraocular lens: Secondary | ICD-10-CM | POA: Diagnosis not present

## 2023-03-30 DIAGNOSIS — R69 Illness, unspecified: Secondary | ICD-10-CM | POA: Diagnosis not present

## 2023-03-30 NOTE — Progress Notes (Signed)
Office Visit Note  Patient: [name]             Date of Birth: 1941-10-17           MRN: [id number]             PCP: Shelva Majestic, MD Referring: Shelva Majestic, MD Visit Date: 04/13/2023 Occupation: @GUAROCC @  Subjective:  Pain in both hands  History of Present Illness: [name] is a 82 y.o. female history of osteoarthritis and positive rheumatoid factor.  She states she has been having gradually increased pain and discomfort in her hands.  She states her bilateral CMC joints are constantly hurting.  She is also noticed some deformities in her hands which she describes over the DIP joints.  She continues to have discomfort in her knee joints, right elbow and her feet.  She has not noticed any joint swelling.    Activities of Daily Living:  Patient reports morning stiffness for 30 minutes.   Patient Denies nocturnal pain.  Difficulty dressing/grooming: Denies Difficulty climbing stairs: Denies Difficulty getting out of chair: Denies Difficulty using hands for taps, buttons, cutlery, and/or writing: Reports  Review of Systems  Constitutional:  Negative for fatigue.  HENT:  Negative for mouth sores and mouth dryness.   Eyes:  Negative for dryness.  Respiratory:  Negative for shortness of breath.   Cardiovascular:  Negative for chest pain and palpitations.  Gastrointestinal:  Negative for blood in stool, constipation and diarrhea.  Endocrine: Negative for increased urination.  Genitourinary:  Negative for involuntary urination.  Musculoskeletal:  Positive for joint pain, joint pain and morning stiffness. Negative for gait problem, joint swelling, myalgias, muscle weakness, muscle tenderness and myalgias.  Skin:  Negative for color change, rash, hair loss and sensitivity to sunlight.  Allergic/Immunologic: Negative for susceptible to infections.  Neurological:  Negative for dizziness and headaches.  Hematological:  Negative for swollen glands.   Psychiatric/Behavioral:  Positive for sleep disturbance. Negative for depressed mood. The patient is not nervous/anxious.     PMFS History:  Patient Active Problem List   Diagnosis Date Noted   Itchy skin 05/30/2020   Diaphoresis 05/12/2018   Trigger finger, right middle finger 09/05/2017   History of adenomatous polyp of colon 08/01/2017   Primary osteoarthritis of both knees 03/10/2017   Primary osteoarthritis of both hands 03/09/2017   Primary osteoarthritis of both feet 03/09/2017   Rheumatoid factor positive 03/09/2017   Advanced directives, counseling/discussion 01/26/2017   nonobstructive CAD (coronary artery disease) 12/01/2015   Varicose veins of both lower extremities 12/01/2015   Insomnia 12/01/2015   Glaucoma 12/01/2015   Memory loss 12/01/2015   Hammer toe    Arthritis 08/29/2012   GERD 09/06/2009   Edema 09/06/2009   History of syncope 09/06/2009   Major depression in full remission 05/19/2007   Essential hypertension 05/19/2007   Hyperglycemia 05/19/2007    Past Medical History:  Diagnosis Date   Anginal pain 08/29/2012   "have had problems w/this"   Anxiety 08/29/2012   Arthritis 08/29/2012   right hand, Seen by Dr. Corliss Skains   CAD (coronary artery disease)    nonobstructive   Depression    Exertional dyspnea 08/29/2012   GERD (gastroesophageal reflux disease)    Hammer toe    surgery x2, second with Dr. Lajoyce Corners- only other option amputatoin   HLD (hyperlipidemia)    HTN (hypertension)    PNEUMONIA 07/04/2007   Qualifier: Diagnosis of  By: Drue Novel MD, Nolon Rod.  Syncope and collapse 08/29/2012   "first time ever"    Family History  Problem Relation Age of Onset   Heart disease Mother        brother, father   Diabetes Mother        sister, brother   Suicidality Father        67   Coronary artery disease Brother    Colon cancer Brother    Stroke Brother    Alcoholism Brother    Ovarian cancer Daughter    Esophageal cancer Neg Hx    Rectal cancer Neg Hx     Stomach cancer Neg Hx    Liver cancer Neg Hx    Past Surgical History:  Procedure Laterality Date   ABDOMINAL HYSTERECTOMY  1970's   APPENDECTOMY  ~ 1970   CATARACT EXTRACTION W/ INTRAOCULAR LENS  IMPLANT, BILATERAL  ~ 05/03/2009   bilateral   TONSILLECTOMY AND ADENOIDECTOMY  1950's   VEIN LIGATION AND STRIPPING  May 04, 2011   bilaterally   Social History   Social History Narrative   Widowed November 02, 2013. Daughter died 05-03-2012. 2 living children. 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      Hobbies: gardening, time with dog, time with grandkids   Immunization History  Administered Date(s) Administered   Fluad Quad(high Dose 65+) 09/25/2019, 10/07/2020   Influenza, High Dose Seasonal PF 08/24/2016, 09/28/2018, 09/17/2022   Influenza-Unspecified 11/12/2015, 09/25/2019, 09/03/2021   PFIZER Comirnaty(Gray Top)Covid-19 Tri-Sucrose Vaccine 02/17/2021, 09/22/2022   PFIZER(Purple Top)SARS-COV-2 Vaccination 01/21/2020, 02/11/2020, 08/19/2020, 11/13/2021   PNEUMOCOCCAL CONJUGATE-20 09/22/2022   PPD Test 05/06/2021   Pneumococcal Conjugate-13 08/24/2016   Pneumococcal Polysaccharide-23 12/29/1999   Respiratory Syncytial Virus Vaccine,Recomb Aduvanted(Arexvy) 09/17/2022   Td 12/28/2005, 10/26/2016   Zoster Recombinat (Shingrix) 12/01/2021, 02/12/2022     Objective: Vital Signs: BP 123/74 (BP Location: Left Arm, Patient Position: Sitting, Cuff Size: Large)   Pulse 65   Resp 14   Ht  (1.6 m)   Wt 193 lb 6.4 oz (87.7 kg)   BMI 34.26 kg/m    Physical Exam Vitals and nursing note reviewed.  Constitutional:      Appearance: She is well-developed.  HENT:     Head: Normocephalic and atraumatic.  Eyes:     Conjunctiva/sclera: Conjunctivae normal.  Cardiovascular:     Rate and Rhythm: Normal rate and regular rhythm.     Heart sounds: Normal heart sounds.   Pulmonary:     Effort: Pulmonary effort is normal.     Breath sounds: Normal breath sounds.  Abdominal:     General: Bowel sounds are normal.     Palpations: Abdomen is soft.  Musculoskeletal:     Cervical back: Normal range of motion.  Lymphadenopathy:     Cervical: No cervical adenopathy.  Skin:    General: Skin is warm and dry.     Capillary Refill: Capillary refill takes less than 2 seconds.  Neurological:     Mental Status: She is alert and oriented to person, place, and time.  Psychiatric:        Behavior: Behavior normal.      Musculoskeletal Exam: She had limited lateral rotation of the cervical spine.  Thoracic kyphosis was noted.  Shoulder joints, elbow joints, wrist joints were in good range of motion.  She had bilateral CMC subluxation  and thickening.  Bilateral PIP and DIP thickening was noted.  Subluxation of some of the DIP joints was noted.  She had mild swelling over right fifth finger DIP joint.  Hip joints and knee joints in good range of motion.  Overcrowding of the toes was noted.  CDAI Exam: CDAI Score: -- Patient Global: --; Provider Global: -- Swollen: --; Tender: -- Joint Exam 04/13/2023   No joint exam has been documented for this visit   There is currently no information documented on the homunculus. Go to the Rheumatology activity and complete the homunculus joint exam.  Investigation: No additional findings.  Imaging: No results found.  Recent Labs: Lab Results  Component Value Date   WBC 5.5 05/14/2022   HGB 12.6 05/14/2022   PLT 246.0 05/14/2022   NA 141 05/14/2022   K 4.1 05/14/2022   CL 104 05/14/2022   CO2 29 05/14/2022   GLUCOSE 98 05/14/2022   BUN 22 05/14/2022   CREATININE 0.83 05/14/2022   BILITOT 0.7 05/14/2022   ALKPHOS 66 05/14/2022   AST 24 05/14/2022   ALT 19 05/14/2022   PROT 6.8 05/14/2022   ALBUMIN 4.1 05/14/2022   CALCIUM 9.5 05/14/2022   GFRAA >60 04/21/2020    Speciality Comments: No specialty comments  available.  Procedures:  No procedures performed Allergies: Clindamycin/lincomycin and Trazodone and nefazodone   Assessment / Plan:     Visit Diagnoses: Arthritis of carpometacarpal (CMC) joint of both thumbs -   She continues to have pain and discomfort in her CMC joints.  The pain is manageable currently.  She had cortisone injections on June 08, 2022 and had good response to the injections.  I advised patient to contact us if her symptoms get worse.  Primary osteoarthritis of both hands -she has been in disc forward in her bilateral hands.  She has severe osteoarthritis involving PIP and DIP joints.  Inflammatory changes were noted.  Joint protection muscle strengthening was discussed.  She has been exercising on a regular basis at the gym.  Primary osteoarthritis of both knees-she had good range of motion without any warmth swelling or effusion.  She has intermittent discomfort  Primary osteoarthritis of both feet-she has overcrowding of the toes in her bilateral feet.  Proper fitting shoes were emphasized.  Rheumatoid factor positive-no synovitis was noted on the examination.  Primary insomnia-good sleep hygiene was discussed.  History of hypertension-blood pressure was normal at 123/74.  Other medical problems listed as follows:  History of hyperlipidemia  History of coronary artery disease  History of gastroesophageal reflux (GERD)  History of adenomatous polyp of colon  History of depression  Orders: No orders of the defined types were placed in this encounter.  No orders of the defined types were placed in this encounter.    Follow-Up Instructions: Return in about 1 year (around 04/12/2024) for Osteoarthritis.   Pollyann Savoy, MD  Note - This record has been created using Animal nutritionist.  Chart creation errors have been sought, but may not always  have been located. Such creation errors do not reflect on  the standard of medical care.

## 2023-04-01 DIAGNOSIS — Z0100 Encounter for examination of eyes and vision without abnormal findings: Secondary | ICD-10-CM | POA: Diagnosis not present

## 2023-04-13 ENCOUNTER — Ambulatory Visit: Payer: Medicare HMO | Attending: Rheumatology | Admitting: Rheumatology

## 2023-04-13 ENCOUNTER — Encounter: Payer: Self-pay | Admitting: Rheumatology

## 2023-04-13 ENCOUNTER — Telehealth: Payer: Self-pay | Admitting: Family Medicine

## 2023-04-13 VITALS — BP 123/74 | HR 65 | Resp 14 | Ht 63.0 in | Wt 193.4 lb

## 2023-04-13 DIAGNOSIS — Z86010 Personal history of colonic polyps: Secondary | ICD-10-CM | POA: Diagnosis not present

## 2023-04-13 DIAGNOSIS — M19041 Primary osteoarthritis, right hand: Secondary | ICD-10-CM | POA: Diagnosis not present

## 2023-04-13 DIAGNOSIS — M19071 Primary osteoarthritis, right ankle and foot: Secondary | ICD-10-CM | POA: Diagnosis not present

## 2023-04-13 DIAGNOSIS — M180 Bilateral primary osteoarthritis of first carpometacarpal joints: Secondary | ICD-10-CM | POA: Diagnosis not present

## 2023-04-13 DIAGNOSIS — M19072 Primary osteoarthritis, left ankle and foot: Secondary | ICD-10-CM

## 2023-04-13 DIAGNOSIS — Z8679 Personal history of other diseases of the circulatory system: Secondary | ICD-10-CM

## 2023-04-13 DIAGNOSIS — Z8659 Personal history of other mental and behavioral disorders: Secondary | ICD-10-CM | POA: Diagnosis not present

## 2023-04-13 DIAGNOSIS — M19042 Primary osteoarthritis, left hand: Secondary | ICD-10-CM | POA: Diagnosis not present

## 2023-04-13 DIAGNOSIS — Z8639 Personal history of other endocrine, nutritional and metabolic disease: Secondary | ICD-10-CM

## 2023-04-13 DIAGNOSIS — F5101 Primary insomnia: Secondary | ICD-10-CM

## 2023-04-13 DIAGNOSIS — M170 Bilateral primary osteoarthritis of knee: Secondary | ICD-10-CM | POA: Diagnosis not present

## 2023-04-13 DIAGNOSIS — Z8719 Personal history of other diseases of the digestive system: Secondary | ICD-10-CM

## 2023-04-13 DIAGNOSIS — R69 Illness, unspecified: Secondary | ICD-10-CM | POA: Diagnosis not present

## 2023-04-13 DIAGNOSIS — R768 Other specified abnormal immunological findings in serum: Secondary | ICD-10-CM | POA: Diagnosis not present

## 2023-04-13 NOTE — Telephone Encounter (Signed)
Contacted [name] to schedule their annual wellness visit. Patient declined to schedule AWV at this time.  Per patient a representative with [employer] did the AWV in home on 01/31/2023.  [facility] AWV TEAM Direct Dial [phone]

## 2023-04-23 ENCOUNTER — Other Ambulatory Visit: Payer: Self-pay | Admitting: Family Medicine

## 2023-04-23 DIAGNOSIS — K219 Gastro-esophageal reflux disease without esophagitis: Secondary | ICD-10-CM

## 2023-05-10 ENCOUNTER — Other Ambulatory Visit: Payer: Self-pay | Admitting: Family Medicine

## 2023-05-10 DIAGNOSIS — K219 Gastro-esophageal reflux disease without esophagitis: Secondary | ICD-10-CM

## 2023-05-29 DIAGNOSIS — R69 Illness, unspecified: Secondary | ICD-10-CM | POA: Diagnosis not present

## 2023-06-01 ENCOUNTER — Telehealth: Payer: Self-pay | Admitting: Family Medicine

## 2023-06-01 ENCOUNTER — Ambulatory Visit (INDEPENDENT_AMBULATORY_CARE_PROVIDER_SITE_OTHER): Payer: Medicare HMO | Admitting: Family Medicine

## 2023-06-01 ENCOUNTER — Encounter: Payer: Self-pay | Admitting: Family Medicine

## 2023-06-01 ENCOUNTER — Ambulatory Visit (INDEPENDENT_AMBULATORY_CARE_PROVIDER_SITE_OTHER)
Admission: RE | Admit: 2023-06-01 | Discharge: 2023-06-01 | Disposition: A | Discharge status: Home / Self Care | Payer: Medicare HMO | Source: Ambulatory Visit | Attending: Family Medicine | Admitting: Family Medicine

## 2023-06-01 ENCOUNTER — Ambulatory Visit: Payer: Medicare HMO | Admitting: Rheumatology

## 2023-06-01 VITALS — BP 150/78 | HR 66 | Temp 97.7°F | Ht 63.0 in | Wt 190.6 lb

## 2023-06-01 DIAGNOSIS — K219 Gastro-esophageal reflux disease without esophagitis: Secondary | ICD-10-CM | POA: Diagnosis not present

## 2023-06-01 DIAGNOSIS — R0789 Other chest pain: Secondary | ICD-10-CM

## 2023-06-01 DIAGNOSIS — Z0001 Encounter for general adult medical examination with abnormal findings: Secondary | ICD-10-CM

## 2023-06-01 DIAGNOSIS — I2510 Atherosclerotic heart disease of native coronary artery without angina pectoris: Secondary | ICD-10-CM

## 2023-06-01 DIAGNOSIS — Z79899 Other long term (current) drug therapy: Secondary | ICD-10-CM

## 2023-06-01 DIAGNOSIS — R739 Hyperglycemia, unspecified: Secondary | ICD-10-CM

## 2023-06-01 DIAGNOSIS — K449 Diaphragmatic hernia without obstruction or gangrene: Secondary | ICD-10-CM | POA: Diagnosis not present

## 2023-06-01 DIAGNOSIS — F325 Major depressive disorder, single episode, in full remission: Secondary | ICD-10-CM | POA: Diagnosis not present

## 2023-06-01 DIAGNOSIS — I10 Essential (primary) hypertension: Secondary | ICD-10-CM | POA: Diagnosis not present

## 2023-06-01 DIAGNOSIS — Z131 Encounter for screening for diabetes mellitus: Secondary | ICD-10-CM

## 2023-06-01 DIAGNOSIS — R5383 Other fatigue: Secondary | ICD-10-CM | POA: Diagnosis not present

## 2023-06-01 LAB — CBC WITH DIFFERENTIAL/PLATELET
Basophils Absolute: 0 10*3/uL (ref 0.0–0.1)
Basophils Relative: 0.6 % (ref 0.0–3.0)
Eosinophils Absolute: 0.1 10*3/uL (ref 0.0–0.7)
Eosinophils Relative: 1.6 % (ref 0.0–5.0)
HCT: 35 % — ABNORMAL LOW (ref 36.0–46.0)
Hemoglobin: 11.6 g/dL — ABNORMAL LOW (ref 12.0–15.0)
Lymphocytes Relative: 28.6 % (ref 12.0–46.0)
Lymphs Abs: 1.6 10*3/uL (ref 0.7–4.0)
MCHC: 33.2 g/dL (ref 30.0–36.0)
MCV: 81.7 fl (ref 78.0–100.0)
Monocytes Absolute: 0.3 10*3/uL (ref 0.1–1.0)
Monocytes Relative: 5.2 % (ref 3.0–12.0)
Neutro Abs: 3.7 10*3/uL (ref 1.4–7.7)
Neutrophils Relative %: 64 % (ref 43.0–77.0)
Platelets: 267 10*3/uL (ref 150.0–400.0)
RBC: 4.28 Mil/uL (ref 3.87–5.11)
RDW: 15.3 % (ref 11.5–15.5)
WBC: 5.8 10*3/uL (ref 4.0–10.5)

## 2023-06-01 LAB — LIPID PANEL
Cholesterol: 160 mg/dL (ref 0–200)
HDL: 59.3 mg/dL (ref >39.00)
LDL Cholesterol: 77 mg/dL (ref 0–99)
NonHDL: 100.56
Total CHOL/HDL Ratio: 3
Triglycerides: 120 mg/dL (ref 0.0–149.0)
VLDL: 24 mg/dL (ref 0.0–40.0)

## 2023-06-01 LAB — COMPREHENSIVE METABOLIC PANEL
ALT: 13 U/L (ref 0–35)
AST: 22 U/L (ref 0–37)
Albumin: 4.1 g/dL (ref 3.5–5.2)
Alkaline Phosphatase: 70 U/L (ref 39–117)
BUN: 17 mg/dL (ref 6–23)
CO2: 23 mEq/L (ref 19–32)
Calcium: 9.3 mg/dL (ref 8.4–10.5)
Chloride: 104 mEq/L (ref 96–112)
Creatinine, Ser: 0.72 mg/dL (ref 0.40–1.20)
GFR: 78.45 mL/min (ref >60.00)
Glucose, Bld: 90 mg/dL (ref 70–99)
Potassium: 4.2 mEq/L (ref 3.5–5.1)
Sodium: 140 mEq/L (ref 135–145)
Total Bilirubin: 0.8 mg/dL (ref 0.2–1.2)
Total Protein: 6.8 g/dL (ref 6.0–8.3)

## 2023-06-01 LAB — HEMOGLOBIN A1C: Hgb A1c MFr Bld: 5.9 % (ref 4.6–6.5)

## 2023-06-01 LAB — TSH: TSH: 2.41 u[IU]/mL (ref 0.35–5.50)

## 2023-06-01 LAB — VITAMIN B12: Vitamin B-12: 289 pg/mL (ref 211–911)

## 2023-06-01 MED ORDER — TRIAMCINOLONE ACETONIDE 0.1 % EX CREA: 1.0000 | TOPICAL_CREAM | Freq: Two times a day (BID) | CUTANEOUS | 0 refills | Status: AC

## 2023-06-01 MED ORDER — TRAZODONE HCL 50 MG PO TABS: 25.0000 mg | ORAL_TABLET | Freq: Every evening | ORAL | 3 refills | Status: DC | PRN

## 2023-06-01 MED ORDER — AMLODIPINE BESYLATE 5 MG PO TABS: 5.0000 mg | ORAL_TABLET | Freq: Every day | ORAL | 3 refills | Status: DC

## 2023-06-01 NOTE — Assessment & Plan Note (Signed)
S: medication: Amlodipine 2.5 mg -prio r metoprolol 25 mg extended release weaned off due to lightheadedness Home readings #s: gave away her cuff- home #s were higher but arm was down at side BP Readings from Last 3 Encounters:  06/01/23 (!) 150/78  04/13/23 123/74  03/02/23 (!) 167/75  A/P: blood pressure above goal- increase amlodipine to 5 mg- did not do well on metoprolol in past with lightheadedness so went with alternate

## 2023-06-01 NOTE — Progress Notes (Signed)
Phone [phone]   Subjective:  Patient presents today for their annual physical. Chief complaint-noted.   See problem oriented charting- ROS- full  review of systems was completed and negative except for: chest pain, numbness in fingers when walking, fatigue, constipation, weakness, rash on left 2nd finger- tries 1% hydrocortisone cream and helps but mainly helps covering with bandaid most  The following were reviewed and entered/updated in epic: Past Medical History:  Diagnosis Date   Anginal pain (HCC) 08/29/2012   "have had problems w/this"   Anxiety 08/29/2012   Arthritis 08/29/2012   right hand, Seen by Dr. Corliss Skains   CAD (coronary artery disease)    nonobstructive   Cataract 03/30/07   10/16/09   Depression    Exertional dyspnea 08/29/2012   GERD (gastroesophageal reflux disease)    Hammer toe    surgery x2, second with Dr. Lajoyce Corners- only other option amputatoin   HLD (hyperlipidemia)    HTN (hypertension)    PNEUMONIA 07/04/2007   Qualifier: Diagnosis of  By: Drue Novel MD, Nolon Rod.    Syncope and collapse 08/29/2012   "first time ever"   Patient Active Problem List   Diagnosis Date Noted   Advanced directives, counseling/discussion 01/26/2017    Priority: High   nonobstructive CAD (coronary artery disease) 12/01/2015    Priority: High   Memory loss 12/01/2015    Priority: High   Itchy skin 05/30/2020    Priority: Medium    History of adenomatous polyp of colon 08/01/2017    Priority: Medium    Varicose veins of both lower extremities 12/01/2015    Priority: Medium    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HCC) 05/19/2007    Priority: Medium    Essential hypertension 05/19/2007    Priority: Medium    Insomnia 12/01/2015    Priority: Low   Glaucoma 12/01/2015    Priority: Low   Hammer toe     Priority: Low   Arthritis 08/29/2012    Priority: Low   GERD 09/06/2009    Priority: Low   Edema 09/06/2009    Priority: Low   History of syncope 09/06/2009     Priority: Low   Hyperglycemia 05/19/2007    Priority: Low   Trigger finger, right middle finger 09/05/2017    Priority: 1.   Primary osteoarthritis of both knees 03/10/2017    Priority: 1.   Primary osteoarthritis of both hands 03/09/2017    Priority: 1.   Primary osteoarthritis of both feet 03/09/2017    Priority: 1.   Diaphoresis 05/12/2018   Rheumatoid factor positive 03/09/2017   Past Surgical History:  Procedure Laterality Date   ABDOMINAL HYSTERECTOMY  08/27/1969   APPENDECTOMY  ~ 1970   CATARACT EXTRACTION W/ INTRAOCULAR LENS  IMPLANT, BILATERAL  ~ 2010   bilateral   EYE SURGERY  03/30/07   10/16/09   TONSILLECTOMY AND ADENOIDECTOMY  08/27/1949   VEIN LIGATION AND STRIPPING  12/27/2010   bilaterally    Family History  Problem Relation Age of Onset   Heart disease Mother        brother, father   Diabetes Mother        sister, brother   Vision loss Mother    Suicidality Father        108   Depression Father    Heart disease Father    Vision loss Father    Coronary artery disease Brother    Colon cancer Brother    Stroke Brother    ADD /  ADHD Brother    Anxiety disorder Brother    Cancer Brother    Depression Brother    Diabetes Brother    Alcoholism Brother    Alcohol abuse Brother    Depression Brother    Diabetes Brother    Vision loss Brother    Ovarian cancer Daughter    Arthritis Sister    Diabetes Sister    Hearing loss Sister    Diabetes Sister    Esophageal cancer Neg Hx    Rectal cancer Neg Hx    Stomach cancer Neg Hx    Liver cancer Neg Hx     Medications- reviewed and updated Current Outpatient Medications  Medication Sig Dispense Refill   aspirin 81 MG tablet Take 81 mg by mouth daily.      atorvastatin (LIPITOR) 80 MG tablet Take 1 tablet (80 mg total) by mouth every evening. 90 tablet 3   Cholecalciferol (VITAMIN D3) 50 MCG (2000 UT) TABS Take 1 tablet by mouth daily.     diclofenac sodium (VOLTAREN) 1 % GEL Apply 3 gm to 3 large  joints up to 3 times a day.Dispense 3 tubes with 3 refills. 3 Tube 3   escitalopram (LEXAPRO) 20 MG tablet Take 1 tablet (20 mg total) by mouth daily. 90 tablet 3   omeprazole (PRILOSEC) 20 MG capsule TAKE ONE CAPSULE BY MOUTH IN THE MORNING AND ONE CAPSULE AT BEDTIME 180 capsule 0   omeprazole (PRILOSEC) 40 MG capsule TAKE ONE CAPSULE BY MOUTH DAILY IN THE MORNING AND AT BEDTIME 90 capsule 0   traZODone (DESYREL) 50 MG tablet Take 0.5 tablets (25 mg total) by mouth at bedtime as needed for sleep. 30 tablet 3   triamcinolone cream (KENALOG) 0.1 % Apply 1 Application topically 2 (two) times daily. For 7-10 days maximum 80 g 0   vitamin B-12 (CYANOCOBALAMIN) 1000 MCG tablet Take 1,000 mcg by mouth daily.     amLODipine (NORVASC) 5 MG tablet Take 1 tablet (5 mg total) by mouth daily. 90 tablet 3   No current facility-administered medications for this visit.    Allergies-reviewed and updated Allergies  Allergen Reactions   Clindamycin/Lincomycin     Severe rash and into throat   Trazodone And Nefazodone Other (See Comments)    drowsiness    Social History   Social History Narrative   Widowed 17-Oct-2013. Daughter died 2012-06-17. 2 living children. 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      Hobbies: gardening, time with dog, time with grandkids   Objective  Objective:  BP (!) 150/78   Pulse 66   Temp 97.7 F (36.5 C)   Ht 5\' 3"  (1.6 m)   Wt 190 lb 9.6 oz (86.5 kg)   SpO2 99%   BMI 33.76 kg/m  Gen: NAD, resting comfortably HEENT: Mucous membranes are moist. Oropharynx normal Neck: no thyromegaly CV: RRR no murmurs rubs or gallops Lungs: CTAB no crackles, wheeze, rhonchi Abdomen: soft/nontender/nondistended/normal bowel sounds. No rebound or guarding.  Ext: no edema Skin: warm, dry Neuro: grossly normal, moves all extremities, PERRLA  EKG: normal  sinus rhythm with rate 65, normal axis, normal intervals, no hypertrophy, no st or t wave changes. Stable compared to 01/18/22 other than sinus bradycardia has resolved    Assessment and Plan   82 y.o.  female presenting for annual physical.  Health Maintenance counseling: 1. Anticipatory guidance: Patient counseled regarding regular dental exams -q6 months, eye exams -yearly with glaucoma if not more,  avoiding smoking and second hand smoke , limiting alcohol to 1 beverage per day - very rare alcohol , no illicit drugs .   2. Risk factor reduction:  Advised patient of need for regular exercise and diet rich and fruits and vegetables to reduce risk of heart attack and stroke.  Exercise- 4 days  a week at [facility] last year- down to 2 days a week due to changes in classes availability- gardening and active otherwise.  Diet/weight management-down 10 lbs in last year- has tried to improve diet.  Wt Readings from Last 3 Encounters:  06/01/23 190 lb 9.6 oz (86.5 kg)  04/13/23 193 lb 6.4 oz (87.7 kg)  03/02/23 195 lb 6 oz (88.6 kg)  3. Immunizations/screenings/ancillary studies- yearly COVID shot planned Immunization History  Administered Date(s) Administered   Fluad Quad(high Dose 65+) 09/25/2019, 10/07/2020   Influenza, High Dose Seasonal PF 08/24/2016, 09/28/2018, 09/17/2022   Influenza-Unspecified 11/12/2015, 09/25/2019, 09/03/2021   PFIZER Comirnaty(Gray Top)Covid-19 Tri-Sucrose Vaccine 02/17/2021, 09/22/2022   PFIZER(Purple Top)SARS-COV-2 Vaccination 01/21/2020, 02/11/2020, 08/19/2020, 11/13/2021   PNEUMOCOCCAL CONJUGATE-20 09/22/2022   PPD Test 05/06/2021   Pneumococcal Conjugate-13 08/24/2016   Pneumococcal Polysaccharide-23 12/29/1999   Respiratory Syncytial Virus Vaccine,Recomb Aduvanted(Arexvy) 09/17/2022   Td 12/28/2005, 10/26/2016   Zoster Recombinat (Shingrix) 12/01/2021, 02/12/2022   4. Cervical cancer screening- no History of  abnormal Pap smear-past age based screening recommendations 5. Breast cancer screening-  breast exam (prefers self exam) and mammogram -04/17/21 and plans to stop 6. Colon cancer screening - was told no further colonoscopy due to age per Dr. Lavon Paganini 7. Skin cancer screening-[place] derm yearly now. advised regular sunscreen use. Denies worrisome, changing, or new skin lesions.  8. Birth control/STD check- not dating 61. Osteoporosis screening at 65- 05/18/2021 normal 10. Smoking associated screening -former smoker-quit smoking in 1980-no regular screening required  Status of chronic or acute concerns   # chest pain/fatigue #Nonobstructive CAD #hyperlipidemia -See separate problem oriented note  #hypertension-see separate problem oriented note   # Depression-see separate problem oriented note  # GERD-see separate problem oriented note  # Hyperglycemia/insulin -resistance/prediabetes- a1c up to 5.9 S:  Medication: none Lab Results  Component Value Date   HGBA1C 5.5 11/10/2022   HGBA1C 5.9 05/14/2022   HGBA1C 5.4 11/10/2021  A/P: hopefully stable- update a1c today. Continue without meds for now   #rash on finger-hydrocortisone helps some but not clearing- try triamcinolone up to 7-10 days and if not improving then need to see dermatology  Recommended follow up: Return in about 1 month (around 07/01/2023) for followup or sooner if needed.Schedule b4 you leave. For blood pressure  Future Appointments  Date Time Provider Department Center  07/26/2023  1:20 PM Shelva Majestic, MD [facility]  08/18/2023  8:30 AM Arnaldo Natal, NP [facility]  04/12/2024  3:20 PM Pollyann Savoy, MD [facility] None   Lab/Order associations: fasting   ICD-10-CM   1. Encounter for general adult medical examination with abnormal findings  Z00.01     2. Chest tightness  R07.89 EKG 12-Lead    Ambulatory referral to Cardiology    DG Chest 2 View    3. Coronary artery disease involving native  coronary artery of native heart without angina pectoris  I25.10 Comprehensive metabolic panel    CBC with Differential/Platelet  Lipid panel    4. Major depressive disorder with single episode, in full remission (HCC)  F32.5     5. Essential hypertension  I10 Comprehensive metabolic panel    CBC with Differential/Platelet    Lipid panel    6. Hyperglycemia  R73.9 Hemoglobin A1c    7. Fatigue, unspecified type  R53.83 TSH    8. Screening for diabetes mellitus  Z13.1 Hemoglobin A1c    9. Gastroesophageal reflux disease without esophagitis  K21.9 Ambulatory referral to Gastroenterology    10. High risk medication use  Z79.899 Vitamin B12      Meds ordered this encounter  Medications   amLODipine (NORVASC) 5 MG tablet    Sig: Take 1 tablet (5 mg total) by mouth daily.    Dispense:  90 tablet    Refill:  3   traZODone (DESYREL) 50 MG tablet    Sig: Take 0.5 tablets (25 mg total) by mouth at bedtime as needed for sleep.    Dispense:  30 tablet    Refill:  3   triamcinolone cream (KENALOG) 0.1 %    Sig: Apply 1 Application topically 2 (two) times daily. For 7-10 days maximum    Dispense:  80 g    Refill:  0    Return precautions advised.  Tana Conch, MD

## 2023-06-01 NOTE — Progress Notes (Signed)
Phone [phone] In person visit   Subjective:   [name] is a 82 y.o. year old very pleasant female patient who presents for/with See problem oriented charting  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HCC) 05/19/2007    Priority: Medium    Essential hypertension 05/19/2007    Priority: Medium    Insomnia 12/01/2015    Priority: Low   Glaucoma 12/01/2015    Priority: Low   Hammer toe     Priority: Low   Arthritis 08/29/2012    Priority: Low   GERD 09/06/2009    Priority: Low   Edema 09/06/2009    Priority: Low   History of syncope 09/06/2009    Priority: Low   Hyperglycemia 05/19/2007    Priority: Low   Trigger finger, right middle finger 09/05/2017    Priority: 1.   Primary osteoarthritis of both knees 03/10/2017    Priority: 1.   Primary osteoarthritis of both hands 03/09/2017    Priority: 1.   Primary osteoarthritis of both feet 03/09/2017    Priority: 1.   Diaphoresis 05/12/2018   Rheumatoid factor positive 03/09/2017    Medications- reviewed and updated Current Outpatient Medications  Medication Sig Dispense Refill   aspirin 81 MG tablet Take 81 mg by mouth daily.      atorvastatin (LIPITOR) 80 MG tablet Take 1 tablet (80 mg total) by mouth every evening. 90 tablet 3   Cholecalciferol (VITAMIN D3) 50 MCG (2000 UT) TABS Take 1 tablet by mouth daily.     diclofenac sodium (VOLTAREN) 1 % GEL Apply 3 gm to 3 large joints up to 3 times a day.Dispense 3 tubes with 3 refills. 3 Tube 3    escitalopram (LEXAPRO) 20 MG tablet Take 1 tablet (20 mg total) by mouth daily. 90 tablet 3   omeprazole (PRILOSEC) 20 MG capsule TAKE ONE CAPSULE BY MOUTH IN THE MORNING AND ONE CAPSULE AT BEDTIME 180 capsule 0   omeprazole (PRILOSEC) 40 MG capsule TAKE ONE CAPSULE BY MOUTH DAILY IN THE MORNING AND AT BEDTIME 90 capsule 0   traZODone (DESYREL) 50 MG tablet Take 0.5 tablets (25 mg total) by mouth at bedtime as needed for sleep. 30 tablet 3   triamcinolone cream (KENALOG) 0.1 % Apply 1 Application topically 2 (two) times daily. For 7-10 days maximum 80 g 0   vitamin B-12 (CYANOCOBALAMIN) 1000 MCG tablet Take 1,000 mcg by mouth daily.     amLODipine (NORVASC) 5 MG tablet Take 1 tablet (5 mg total) by mouth daily. 90 tablet 3   No current facility-administered medications for this visit.     Objective:  BP (!) 150/78   Pulse 66   Temp 97.7 F (36.5 C)   Ht 5\' 3"  (1.6 m)   Wt 190 lb 9.6 oz (86.5 kg)   SpO2 99%   BMI 33.76 kg/m  Gen: NAD, resting comfortably    Assessment and Plan   nonobstructive CAD (coronary artery disease) # chest pain/fatigue #  Nonobstructive CAD #hyperlipidemia S: Medication:Atorvastatin 80 mg, aspirin 81 mg   she has noted fatigue over last 2-3 weeks- does not have energy to do basic around the home tasks- cleaning, putting out top soil, etc which she usually does not have issues with. Able to go to [facility] with others and has energy there. Can do yoga without chest pain.   BUT if she is active in the home has noted chest discomfort  up to 8/10 central chest  and "feels like its hard" with activities that need exertion like taking trash out. Gets numbness in both hands with this as well. And feels shortness of breath with it. Worse this week. Seems to bother her every time she exerts herself at home but still able to go to [facility] without issues.   Not sleeping well recently for at least 6 months- has tried multiple things for sleep OTC (available over the counter  without a prescription) without help. Seems to mainly have issues when home alone- when has been at other locations able to sleep- wonders if anxiety contributing. Could sleep in daytime but tries to avoid. Feels like if could get good nights sleep that she would feel better.   - has seen Dr. Jens Som in past- myocardial perfusion stress test with lexiscan 10/02/2019- reassuring. Last visit 09/07/21 nonobstructive CAD noted- cath in 2006 with LAD at 30% and circumflex at 30% -later due to palpations had echocardiogram 03/04/22 with EF 60%, mild LVH concentric, mild to moderate mitral valve regurgitation, aortic valve sclerosis noted with no stenosis  -had COVID 03/02/23 - no obvious correlation  Lab Results  Component Value Date   CHOL 142 05/14/2022   HDL 61.20 05/14/2022   LDLCALC 67 05/14/2022   LDLDIRECT 65.0 05/30/2020   TRIG 72.0 05/14/2022   CHOLHDL 2 05/14/2022  A/P: 82 year old female with nonobstructive coronary disease on catheterization in 2006 with recent onset worsening fatigue on a regular basis as well as exertional chest pain but interestingly only happens at home-does not have this when she does her classes at the [employer] - Evaluate out organic cause with blood work including CBC, CMP, TSH - Given exertional component (although not consistently evident) will refer to cardiology for their expert opinion-placed as urgent - I also wonder if blood pressure elevations could be contributing-we are going to try to lower this as below -Lipids have been at goal but we will update today-prefer LDL under 70 -With chest pain also offered to get chest x-ray which she opted for -Emergent precautions given to patient today  Essential hypertension S: medication: Amlodipine 2.5 mg -prio r metoprolol 25 mg extended release weaned off due to lightheadedness Home readings #s: gave away her cuff- home #s were higher but arm was down at side BP Readings from Last 3 Encounters:  06/01/23 (!) 150/78   04/13/23 123/74  03/02/23 (!) 167/75  A/P: blood pressure above goal- increase amlodipine to 5 mg- did not do well on metoprolol in past with lightheadedness so went with alternate  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HCC) S: Medication:Lexapro 20 mg    06/01/2023   10:17 AM 03/02/2023    4:13 PM 11/10/2022   11:08 AM  Depression screen PHQ 2/9  Decreased Interest 2 0 0  Down, Depressed, Hopeless 3 0 0  PHQ - 2 Score 5 0 0  Altered sleeping 2 0 0  Tired, decreased energy 2 0 0  Change in appetite 0 0 0  Feeling bad or failure about yourself  0 0 0  Trouble concentrating 1 0 0  Moving slowly or fidgety/restless 0 0 0  Suicidal thoughts 0 0 0  PHQ-9 Score 10 0 0  Difficult doing work/chores Not difficult at all Not difficult at all Not difficult at all  A/P: #s are worse and depression could be causing fatigue- her main issue though is sleep and we opted to try trazodone 25 mg to see if that helps with sleep - felt hyper on 50 mg in the past- if that recurs she will stop   GERD S:Medication: Omeprazole 20 mg--> 40 mg 11/10/22 twice daily- helped with dryness but still gets some issues B12 levels related to PPI use: Takes B12 prophylactically A/P: with ongoing reflux despite high dose omeprazole twice daily- will refer to gastroenterology    Recommended follow up: Return in about 1 month (around 07/01/2023) for followup or sooner if needed.Schedule b4 you leave. Future Appointments  Date Time Provider Department Center  07/26/2023  1:20 PM Shelva Majestic, MD [facility]  08/18/2023  8:30 AM Arnaldo Natal, NP [facility]  04/12/2024  3:20 PM Pollyann Savoy, MD [facility] None    Lab/Order associations:   ICD-10-CM   1. Chest tightness  R07.89 EKG 12-Lead    Ambulatory referral to Cardiology    DG Chest 2 View    2. Coronary artery disease involving native coronary artery of native heart without angina pectoris  I25.10 Comprehensive metabolic panel    CBC with  Differential/Platelet    Lipid panel    3. Major depressive disorder with single episode, in full remission (HCC)  F32.5     4. Essential hypertension  I10 Comprehensive metabolic panel    CBC with Differential/Platelet    Lipid panel    5. Gastroesophageal reflux disease without esophagitis  K21.9 Ambulatory referral to Gastroenterology      Meds ordered this encounter  Medications   amLODipine (NORVASC) 5 MG tablet    Sig: Take 1 tablet (5 mg total) by mouth daily.    Dispense:  90 tablet    Refill:  3   traZODone (DESYREL) 50 MG tablet    Sig: Take 0.5 tablets (25 mg total) by mouth at bedtime as needed for sleep.    Dispense:  30 tablet    Refill:  3   triamcinolone cream (KENALOG) 0.1 %    Sig: Apply 1 Application topically 2 (two) times daily. For 7-10 days maximum    Dispense:  80 g    Refill:  0    Return precautions advised.  Tana Conch, MD

## 2023-06-01 NOTE — Patient Instructions (Addendum)
Please stop by lab before you go If you have mychart- we will send your results within 3 business days of [place] receiving them.  If you do not have mychart- we will call you about results within 5 business days of [place] receiving them.  *please also note that you will see labs on mychart as soon as they post. I will later go in and write notes on them- will say "notes from Dr. Durene Cal"   blood pressure above goal- increase amlodipine to 5 mg- did not do well on metoprolol in past with lightheadedness so went with alternate  Trial trazodone 25 mg for sleep- half of the 50 mg - if still feel hyper as you did years ago on this then please stop and let me know  #rash on finger-hydrocortisone helps some but not clearing- try triamcinolone up to 7-10 days and if not improving then need to see dermatology  [facility] contact- call to schedule visit Please call to schedule visit and/or procedure Address: [address] [place], [place], [place] [address] Phone: [phone]   Team placed urgent referral back to cardiology- last seen 2022- please let her know if she needs to wait or if they will simply call her. She can also call their # directly  Please go to [place]  central X-ray (updated 02/21/2020) - located [address] across the street from [place] - in the basement - Hours: 8:30-5:00 PM M-F (with lunch from 12:30- 1 PM). You do NOT need an appointment.    Recommended follow up: Return in about 1 month (around 07/01/2023) for followup or sooner if needed.Schedule b4 you leave.  To recheck blood pressure   If worsening chest pain or shortness of breath please seek care immediately

## 2023-06-01 NOTE — Assessment & Plan Note (Signed)
S: Medication:Lexapro 20 mg    06/01/2023   10:17 AM 03/02/2023    4:13 PM 11/10/2022   11:08 AM  Depression screen PHQ 2/9  Decreased Interest 2 0 0  Down, Depressed, Hopeless 3 0 0  PHQ - 2 Score 5 0 0  Altered sleeping 2 0 0  Tired, decreased energy 2 0 0  Change in appetite 0 0 0  Feeling bad or failure about yourself  0 0 0  Trouble concentrating 1 0 0  Moving slowly or fidgety/restless 0 0 0  Suicidal thoughts 0 0 0  PHQ-9 Score 10 0 0  Difficult doing work/chores Not difficult at all Not difficult at all Not difficult at all  A/P: #s are worse and depression could be causing fatigue- her main issue though is sleep and we opted to try trazodone 25 mg to see if that helps with sleep - felt hyper on 50 mg in the past- if that recurs she will stop

## 2023-06-01 NOTE — Assessment & Plan Note (Addendum)
#   chest pain/fatigue #Nonobstructive CAD #hyperlipidemia S: Medication:Atorvastatin 80 mg, aspirin 81 mg   she has noted fatigue over last 2-3 weeks- does not have energy to do basic around the home tasks- cleaning, putting out top soil, etc which she usually does not have issues with. Able to go to [facility] with others and has energy there. Can do yoga without chest pain.   BUT if she is active in the home has noted chest discomfort  up to 8/10 central chest  and "feels like its hard" with activities that need exertion like taking trash out. Gets numbness in both hands with this as well. And feels shortness of breath with it. Worse this week. Seems to bother her every time she exerts herself at home but still able to go to [facility] without issues.   Not sleeping well recently for at least 6 months- has tried multiple things for sleep OTC (available over the counter without a prescription) without help. Seems to mainly have issues when home alone- when has been at other locations able to sleep- wonders if anxiety contributing. Could sleep in daytime but tries to avoid. Feels like if could get good nights sleep that she would feel better.   - has seen Dr. Jens Som in past- myocardial perfusion stress test with lexiscan 10/02/2019- reassuring. Last visit 09/07/21 nonobstructive CAD noted- cath in 2006 with LAD at 30% and circumflex at 30% -later due to palpations had echocardiogram 03/04/22 with EF 60%, mild LVH concentric, mild to moderate mitral valve regurgitation, aortic valve sclerosis noted with no stenosis  -had COVID 03/02/23 - no obvious correlation  Lab Results  Component Value Date   CHOL 142 05/14/2022   HDL 61.20 05/14/2022   LDLCALC 67 05/14/2022   LDLDIRECT 65.0 05/30/2020   TRIG 72.0 05/14/2022   CHOLHDL 2 05/14/2022  A/P: 82 year old female with nonobstructive coronary disease on catheterization in 2006 with recent onset worsening fatigue on a regular basis as well as exertional chest pain but  interestingly only happens at home-does not have this when she does her classes at the [employer] - Evaluate out organic cause with blood work including CBC, CMP, TSH - Given exertional component (although not consistently evident) will refer to cardiology for their expert opinion-placed as urgent - I also wonder if blood pressure elevations could be contributing-we are going to try to lower this as below -Lipids have been at goal but we will update today-prefer LDL under 70 -With chest pain also offered to get chest x-ray which she opted for -Emergent precautions given to patient today

## 2023-06-01 NOTE — Telephone Encounter (Signed)
Pt states the Cardiologist office does not have appt until December and pt wasn't to know if she should go to another office. Please advise.

## 2023-06-01 NOTE — Assessment & Plan Note (Signed)
S:Medication: Omeprazole 20 mg--> 40 mg 11/10/22 twice daily- helped with dryness but still gets some issues B12 levels related to PPI use: Takes B12 prophylactically A/P: with ongoing reflux despite high dose omeprazole twice daily- will refer to gastroenterology

## 2023-06-02 NOTE — Telephone Encounter (Signed)
I am okay with any cardiology office-an urgent referral I Placed should not take until December

## 2023-06-03 ENCOUNTER — Other Ambulatory Visit: Payer: Self-pay

## 2023-06-03 DIAGNOSIS — D649 Anemia, unspecified: Secondary | ICD-10-CM

## 2023-06-03 NOTE — Telephone Encounter (Signed)
New referral has been placed.  °

## 2023-06-07 ENCOUNTER — Other Ambulatory Visit: Payer: Medicare HMO

## 2023-06-08 ENCOUNTER — Telehealth: Payer: Self-pay

## 2023-06-08 ENCOUNTER — Other Ambulatory Visit (INDEPENDENT_AMBULATORY_CARE_PROVIDER_SITE_OTHER): Payer: Medicare HMO

## 2023-06-08 DIAGNOSIS — R195 Other fecal abnormalities: Secondary | ICD-10-CM

## 2023-06-08 DIAGNOSIS — D649 Anemia, unspecified: Secondary | ICD-10-CM

## 2023-06-08 LAB — FECAL OCCULT BLOOD, IMMUNOCHEMICAL: Fecal Occult Bld: POSITIVE — AB

## 2023-06-08 NOTE — Telephone Encounter (Signed)
Admin: please call and schedule repeat blood count for pt in 2 weeks due to positive blood in stool.

## 2023-06-08 NOTE — Telephone Encounter (Signed)
With new onset anemia, and positive blood in the stool, history of colon polyps-please do urgent referral back to GI and let Misty Stanley know.  I suspect is going to be a month or 2 until she gets send so please recheck a CBC to assess stability under anemia unspecified

## 2023-06-08 NOTE — Telephone Encounter (Signed)
Patient has been scheduled for lab on 6/26 @ 9 am.

## 2023-06-08 NOTE — Telephone Encounter (Signed)
[facility] with a positive ifob.

## 2023-06-13 ENCOUNTER — Telehealth: Payer: Self-pay | Admitting: Gastroenterology

## 2023-06-13 ENCOUNTER — Telehealth: Payer: Self-pay | Admitting: Family Medicine

## 2023-06-13 NOTE — Telephone Encounter (Signed)
Called and spoke with pt and questions about metoprolol answered, pt also stated she is no longer taking Trazadone, I have taken this off of pt med list also.

## 2023-06-13 NOTE — Telephone Encounter (Signed)
Patient requests to be called re: has question about Metoprolol Succinate ER 25mg  tablets

## 2023-06-13 NOTE — Telephone Encounter (Signed)
Urgent referral in [facility] for anemia.  Patient is scheduled for 08/18/2023.  Last saw Dr. Lavon Paganini in 2022.   Please review and advise urgency and scheduling.

## 2023-06-14 NOTE — Telephone Encounter (Signed)
Please schedule with APP soon.  Thank you

## 2023-06-15 ENCOUNTER — Ambulatory Visit: Payer: Medicare HMO | Admitting: Family Medicine

## 2023-06-21 ENCOUNTER — Other Ambulatory Visit: Payer: Self-pay | Admitting: Family Medicine

## 2023-06-21 DIAGNOSIS — K219 Gastro-esophageal reflux disease without esophagitis: Secondary | ICD-10-CM

## 2023-06-22 ENCOUNTER — Ambulatory Visit: Payer: Medicare HMO | Admitting: Family Medicine

## 2023-06-22 ENCOUNTER — Other Ambulatory Visit (INDEPENDENT_AMBULATORY_CARE_PROVIDER_SITE_OTHER): Payer: Medicare HMO

## 2023-06-22 DIAGNOSIS — R195 Other fecal abnormalities: Secondary | ICD-10-CM | POA: Diagnosis not present

## 2023-06-22 DIAGNOSIS — D649 Anemia, unspecified: Secondary | ICD-10-CM | POA: Diagnosis not present

## 2023-06-22 LAB — CBC WITH DIFFERENTIAL/PLATELET
Basophils Absolute: 0 10*3/uL (ref 0.0–0.1)
Basophils Relative: 0.7 % (ref 0.0–3.0)
Eosinophils Absolute: 0.1 10*3/uL (ref 0.0–0.7)
Eosinophils Relative: 2.3 % (ref 0.0–5.0)
HCT: 32.6 % — ABNORMAL LOW (ref 36.0–46.0)
Hemoglobin: 10.7 g/dL — ABNORMAL LOW (ref 12.0–15.0)
Lymphocytes Relative: 30.2 % (ref 12.0–46.0)
Lymphs Abs: 1.6 10*3/uL (ref 0.7–4.0)
MCHC: 32.8 g/dL (ref 30.0–36.0)
MCV: 80.8 fl (ref 78.0–100.0)
Monocytes Absolute: 0.3 10*3/uL (ref 0.1–1.0)
Monocytes Relative: 6.6 % (ref 3.0–12.0)
Neutro Abs: 3.1 10*3/uL (ref 1.4–7.7)
Neutrophils Relative %: 60.2 % (ref 43.0–77.0)
Platelets: 264 10*3/uL (ref 150.0–400.0)
RBC: 4.04 Mil/uL (ref 3.87–5.11)
RDW: 14.8 % (ref 11.5–15.5)
WBC: 5.2 10*3/uL (ref 4.0–10.5)

## 2023-06-22 NOTE — Progress Notes (Signed)
HPI: FU CAD.  Cardiac cath 2006 showed 30% LAD, 30% circumflex and irregularities in the right coronary artery. Her ejection fraction was 60%. She has also had a previous abdominal ultrasound that showed no aneurysm in October 2007. Nuclear study October 2020 showed ejection fraction 72% and normal perfusion.  ABIs March 2022 showed abnormal toe brachial index on the left.  Monitor February 2023 showed sinus rhythm with rare PAC and PVC, occasional short runs of SVT (longest 17, nonsustained ventricular tachycardia longest 7 beats.  Echocardiogram March 2023 showed normal LV function, mild left ventricular hypertrophy, mild to moderate mitral regurgitation.  Carotid Dopplers July 2023 showed 1 to 39% bilateral stenosis.  Recently found to be anemic with heme positive stool and referral placed to gastroenterology.  Since last seen, patient notes that in the past 1 month she has had chest pressure with more vigorous activities relieved with rest.  Also with dyspnea on exertion.  This does not occur with routine activities or at rest.  She denies orthopnea, PND, pedal edema or syncope.  Current Outpatient Medications  Medication Sig Dispense Refill   amLODipine (NORVASC) 5 MG tablet Take 1 tablet (5 mg total) by mouth daily. 90 tablet 3   aspirin 81 MG tablet Take 81 mg by mouth daily.      atorvastatin (LIPITOR) 80 MG tablet Take 1 tablet (80 mg total) by mouth every evening. 90 tablet 3   Cholecalciferol (VITAMIN D3) 50 MCG (2000 UT) TABS Take 1 tablet by mouth daily.     diclofenac sodium (VOLTAREN) 1 % GEL Apply 3 gm to 3 large joints up to 3 times a day.Dispense 3 tubes with 3 refills. 3 Tube 3   escitalopram (LEXAPRO) 20 MG tablet Take 1 tablet (20 mg total) by mouth daily. 90 tablet 3   omeprazole (PRILOSEC) 20 MG capsule TAKE ONE CAPSULE BY MOUTH IN THE MORNING AND ONE CAPSULE AT BEDTIME 180 capsule 0   omeprazole (PRILOSEC) 40 MG capsule TAKE 1 CAPSULE BY MOUTH DAILY EVERY MORNING AND AT  BEDTIME 90 capsule 0   triamcinolone cream (KENALOG) 0.1 % Apply 1 Application topically 2 (two) times daily. For 7-10 days maximum 80 g 0   vitamin B-12 (CYANOCOBALAMIN) 1000 MCG tablet Take 1,000 mcg by mouth daily.     No current facility-administered medications for this visit.     Past Medical History:  Diagnosis Date   Anginal pain (HCC) 08/29/2012   "have had problems w/this"   Anxiety 08/29/2012   Arthritis 08/29/2012   right hand, Seen by Dr. Corliss Skains   CAD (coronary artery disease)    nonobstructive   Cataract 03/30/07   10/16/09   Depression    Exertional dyspnea 08/29/2012   GERD (gastroesophageal reflux disease)    Hammer toe    surgery x2, second with Dr. Lajoyce Corners- only other option amputatoin   HLD (hyperlipidemia)    HTN (hypertension)    PNEUMONIA 07/04/2007   Qualifier: Diagnosis of  By: Drue Novel MD, Nolon Rod.    Syncope and collapse 08/29/2012   "first time ever"    Past Surgical History:  Procedure Laterality Date   ABDOMINAL HYSTERECTOMY  08/27/1969   APPENDECTOMY  ~ 1970   CATARACT EXTRACTION W/ INTRAOCULAR LENS  IMPLANT, BILATERAL  ~ 2010   bilateral   EYE SURGERY  03/30/07   10/16/09   TONSILLECTOMY AND ADENOIDECTOMY  08/27/1949   VEIN LIGATION AND STRIPPING  12/27/2010   bilaterally    Social History  Socioeconomic History   Marital status: Widowed    Spouse name: Not on file   Number of children: 3   Years of education: Not on file   Highest education level: Not on file  Occupational History   Occupation: retired  Tobacco Use   Smoking status: Former    Packs/day: 1.00    Years: 20.00    Additional pack years: 0.00    Total pack years: 20.00    Types: Cigarettes    Quit date: 12/27/1988    Years since quitting: 34.5    Passive exposure: Never   Smokeless tobacco: Never   Tobacco comments:    teenager  Vaping Use   Vaping Use: Never used  Substance and Sexual Activity   Alcohol use: No   Drug use: No   Sexual activity: Not  Currently  Other Topics Concern   Not on file  Social History Narrative   Widowed 15-Oct-2013. Daughter died 2012-07-15. 2 living children. 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      Hobbies: gardening, time with dog, time with grandkids   Social Determinants of Health   Financial Resource Strain: Not on file  Food Insecurity: Not on file  Transportation Needs: Not on file  Physical Activity: Not on file  Stress: Not on file  Social Connections: Not on file  Intimate Partner Violence: Not on file    Family History  Problem Relation Age of Onset   Heart disease Mother        brother, father   Diabetes Mother        sister, brother   Vision loss Mother    Suicidality Father        32   Depression Father    Heart disease Father    Vision loss Father    Coronary artery disease Brother    Colon cancer Brother    Stroke Brother    ADD / ADHD Brother    Anxiety disorder Brother    Cancer Brother    Depression Brother    Diabetes Brother    Alcoholism Brother    Alcohol abuse Brother    Depression Brother    Diabetes Brother    Vision loss Brother    Ovarian cancer Daughter    Arthritis Sister    Diabetes Sister    Hearing loss Sister    Diabetes Sister    Esophageal cancer Neg Hx    Rectal cancer Neg Hx    Stomach cancer Neg Hx    Liver cancer Neg Hx     ROS: no fevers or chills, productive cough, hemoptysis, dysphasia, odynophagia, melena, hematochezia, dysuria, hematuria, rash, seizure activity, orthopnea, PND, pedal edema, claudication. Remaining systems are negative.  Physical Exam: Well-developed well-nourished in no acute distress.  Skin is warm and dry.  HEENT is normal.  Neck is supple.  Chest is clear to auscultation with normal expansion.  Cardiovascular exam is regular rate and rhythm.  Abdominal exam nontender or  distended. No masses palpated. Extremities show no edema. neuro grossly intact  EKG Interpretation Date/Time:  Monday July 04 2023 08:00:17 EDT Ventricular Rate:  71 PR Interval:  164 QRS Duration:  100 QT Interval:  422 QTC Calculation: 458 R Axis:   47  Text Interpretation: Normal sinus rhythm Nonspecific ST abnormality  When compared with ECG of 21-Apr-2020 12:55, No significant change was found Confirmed by Olga Millers ([id number]) on 07/04/2023 8:01:33 AM    A/P  1 chest pain-symptoms with concerning features though she has had some chest pain previously.  Will continue aspirin and statin.  Increase amlodipine to 10 mg daily both for blood pressure and as antianginal.  Schedule coronary CTA to rule out obstructive coronary disease.  2 coronary artery disease-mild on previous catheterization.  Continue aspirin and statin.  3 hypertension-patient's blood pressure is elevated.  Increase amlodipine to 10 mg daily and follow.  4 hyperlipidemia-last lipid panel June 2024 showed total cholesterol 160, HDL 59 and LDL 77.  LFTs normal.  Given history of mild coronary disease will add Zetia 10 mg daily.  Check lipids and liver in 8 weeks.  5 carotid artery disease-mild on most recent carotid Dopplers.  Continue medical therapy.  Olga Millers, MD

## 2023-06-23 ENCOUNTER — Other Ambulatory Visit: Payer: Self-pay

## 2023-06-23 DIAGNOSIS — D649 Anemia, unspecified: Secondary | ICD-10-CM

## 2023-07-01 ENCOUNTER — Ambulatory Visit: Payer: Medicare HMO | Admitting: General Practice

## 2023-07-04 ENCOUNTER — Telehealth: Payer: Self-pay | Admitting: Family Medicine

## 2023-07-04 ENCOUNTER — Encounter: Payer: Self-pay | Admitting: Cardiology

## 2023-07-04 ENCOUNTER — Ambulatory Visit: Payer: Medicare HMO | Attending: General Practice | Admitting: Cardiology

## 2023-07-04 VITALS — BP 142/68 | HR 62 | Ht 63.0 in | Wt 191.0 lb

## 2023-07-04 DIAGNOSIS — R072 Precordial pain: Secondary | ICD-10-CM

## 2023-07-04 DIAGNOSIS — I10 Essential (primary) hypertension: Secondary | ICD-10-CM | POA: Diagnosis not present

## 2023-07-04 DIAGNOSIS — I2510 Atherosclerotic heart disease of native coronary artery without angina pectoris: Secondary | ICD-10-CM

## 2023-07-04 DIAGNOSIS — E785 Hyperlipidemia, unspecified: Secondary | ICD-10-CM

## 2023-07-04 MED ORDER — EZETIMIBE 10 MG PO TABS: 10.0000 mg | ORAL_TABLET | Freq: Every day | ORAL | 3 refills | Status: DC

## 2023-07-04 MED ORDER — METOPROLOL TARTRATE 100 MG PO TABS: ORAL_TABLET | ORAL | 0 refills | Status: DC

## 2023-07-04 MED ORDER — AMLODIPINE BESYLATE 10 MG PO TABS: 10.0000 mg | ORAL_TABLET | Freq: Every day | ORAL | 3 refills | Status: DC

## 2023-07-04 NOTE — Telephone Encounter (Signed)
Just an FYI - Pt needed a sooner appt and she was given one for 07/21/23 at 9am - GI

## 2023-07-04 NOTE — Patient Instructions (Signed)
Medication Instructions:   INCREASE AMLODIPINE TO 10 MG ONCE DAILY= 2 OF THE 5 MG TABLETS ONCE DAILY  START EZETIMIBE 10 MG ONCE DAILY  *If you need a refill on your cardiac medications before your next appointment, please call your pharmacy*   Lab Work:  Your physician recommends that you return for lab work in: 8 [facility]  If you have labs (blood work) drawn today and your tests are completely normal, you will receive your results only by: MyChart Message (if you have MyChart) OR A paper copy in the mail If you have any lab test that is abnormal or we need to change your treatment, we will call you to review the results.   Testing/Procedures:    Your cardiac CT will be scheduled at   [facility] [address] [place], [place] [address] [phone]    If scheduled at [facility], please arrive at the [facility] and Children's Entrance (Entrance C2) of [facility] 30 minutes prior to test start time. You can use the FREE valet parking offered at entrance C (encouraged to control the heart rate for the test)  Proceed to the [facility] Radiology Department (first floor) to check-in and test prep.  All radiology patients and guests should use entrance C2 at [facility], accessed from [facility], even though the hospital's physical address listed is [address]       Please follow these instructions carefully (unless otherwise directed):   On the Night Before the Test: Be sure to Drink plenty of water. Do not consume any caffeinated/decaffeinated beverages or chocolate 12 hours prior to your test. Do not take any antihistamines 12 hours prior to your test.   On the Day of the Test: Drink plenty of water until 1 hour prior to the test. Do not eat any food 1 hour prior to test. You may take your regular medications prior to the test.  Take metoprolol (Lopressor) 100 mg two hours prior to  test. FEMALES- please wear underwire-free bra if available, avoid dresses & tight clothing       After the Test: Drink plenty of water. After receiving IV contrast, you may experience a mild flushed feeling. This is normal. On occasion, you may experience a mild rash up to 24 hours after the test. This is not dangerous. If this occurs, you can take Benadryl 25 mg and increase your fluid intake. If you experience trouble breathing, this can be serious. If it is severe call 911 IMMEDIATELY. If it is mild, please call our office.   We will call to schedule your test 2-4 weeks out understanding that some insurance companies will need an authorization prior to the service being performed.   For more information and frequently asked questions, please visit our website : [email or web address]  For non-scheduling related questions, please contact the cardiac imaging nurse navigator should you have any questions/concerns: Rockwell Alexandria, Cardiac Imaging Nurse Navigator Larey Brick, Cardiac Imaging Nurse Navigator [facility] Direct Office Dial: [phone]   For scheduling needs, including cancellations and rescheduling, please call [place], [phone].    Follow-Up: At [facility], you and your health needs are our priority.  As part of our continuing mission to provide you with exceptional heart care, we have created designated Provider Care Teams.  These Care Teams include your primary Cardiologist (physician) and Advanced Practice Providers (APPs -  Physician Assistants and Nurse Practitioners) who all work together  to provide you with the care you need, when you need it.  We recommend signing up for the patient portal called "MyChart".  Sign up information is provided on this After Visit Summary.  MyChart is used to connect with patients for Virtual Visits (Telemedicine).  Patients are able to view lab/test results, encounter notes,  upcoming appointments, etc.  Non-urgent messages can be sent to your provider as well.   To learn more about what you can do with MyChart, go to [email or web address].    Your next appointment:   6 week(s)  Provider:   ANY APP   Your physician wants you to follow-up in: 6 MONTHS WITH DR Jens Som You will receive a reminder letter in the mail two months in advance. If you don't receive a letter, please call our office to schedule the follow-up appointment.

## 2023-07-05 ENCOUNTER — Other Ambulatory Visit (INDEPENDENT_AMBULATORY_CARE_PROVIDER_SITE_OTHER): Payer: Medicare HMO

## 2023-07-05 DIAGNOSIS — D649 Anemia, unspecified: Secondary | ICD-10-CM

## 2023-07-05 LAB — CBC WITH DIFFERENTIAL/PLATELET
Basophils Absolute: 0 10*3/uL (ref 0.0–0.1)
Basophils Relative: 0.7 % (ref 0.0–3.0)
Eosinophils Absolute: 0.1 10*3/uL (ref 0.0–0.7)
Eosinophils Relative: 2.2 % (ref 0.0–5.0)
HCT: 33.9 % — ABNORMAL LOW (ref 36.0–46.0)
Hemoglobin: 11.3 g/dL — ABNORMAL LOW (ref 12.0–15.0)
Lymphocytes Relative: 30 % (ref 12.0–46.0)
Lymphs Abs: 1.8 10*3/uL (ref 0.7–4.0)
MCHC: 33.2 g/dL (ref 30.0–36.0)
MCV: 81.3 fl (ref 78.0–100.0)
Monocytes Absolute: 0.3 10*3/uL (ref 0.1–1.0)
Monocytes Relative: 5.2 % (ref 3.0–12.0)
Neutro Abs: 3.7 10*3/uL (ref 1.4–7.7)
Neutrophils Relative %: 61.9 % (ref 43.0–77.0)
Platelets: 280 10*3/uL (ref 150.0–400.0)
RBC: 4.17 Mil/uL (ref 3.87–5.11)
RDW: 15.1 % (ref 11.5–15.5)
WBC: 5.9 10*3/uL (ref 4.0–10.5)

## 2023-07-06 ENCOUNTER — Other Ambulatory Visit: Payer: Medicare HMO

## 2023-07-11 ENCOUNTER — Other Ambulatory Visit: Payer: Medicare HMO

## 2023-07-11 ENCOUNTER — Other Ambulatory Visit: Payer: Self-pay | Admitting: *Deleted

## 2023-07-11 ENCOUNTER — Encounter: Payer: Self-pay | Admitting: Physician Assistant

## 2023-07-11 ENCOUNTER — Ambulatory Visit: Payer: Medicare HMO | Admitting: Physician Assistant

## 2023-07-11 ENCOUNTER — Telehealth (HOSPITAL_COMMUNITY): Payer: Self-pay | Admitting: *Deleted

## 2023-07-11 VITALS — BP 132/58 | HR 72 | Wt 160.0 lb

## 2023-07-11 DIAGNOSIS — D509 Iron deficiency anemia, unspecified: Secondary | ICD-10-CM

## 2023-07-11 DIAGNOSIS — R1310 Dysphagia, unspecified: Secondary | ICD-10-CM

## 2023-07-11 DIAGNOSIS — R195 Other fecal abnormalities: Secondary | ICD-10-CM | POA: Diagnosis not present

## 2023-07-11 DIAGNOSIS — K219 Gastro-esophageal reflux disease without esophagitis: Secondary | ICD-10-CM

## 2023-07-11 LAB — CBC WITH DIFFERENTIAL/PLATELET
Basophils Absolute: 0.1 10*3/uL (ref 0.0–0.1)
Basophils Relative: 1.1 % (ref 0.0–3.0)
Eosinophils Absolute: 0.2 10*3/uL (ref 0.0–0.7)
Eosinophils Relative: 2.9 % (ref 0.0–5.0)
HCT: 33.2 % — ABNORMAL LOW (ref 36.0–46.0)
Hemoglobin: 11 g/dL — ABNORMAL LOW (ref 12.0–15.0)
Lymphocytes Relative: 31.9 % (ref 12.0–46.0)
Lymphs Abs: 1.7 10*3/uL (ref 0.7–4.0)
MCHC: 33.1 g/dL (ref 30.0–36.0)
MCV: 80.9 fl (ref 78.0–100.0)
Monocytes Absolute: 0.3 10*3/uL (ref 0.1–1.0)
Monocytes Relative: 6.5 % (ref 3.0–12.0)
Neutro Abs: 3.1 10*3/uL (ref 1.4–7.7)
Neutrophils Relative %: 57.6 % (ref 43.0–77.0)
Platelets: 280 10*3/uL (ref 150.0–400.0)
RBC: 4.1 Mil/uL (ref 3.87–5.11)
RDW: 15.4 % (ref 11.5–15.5)
WBC: 5.3 10*3/uL (ref 4.0–10.5)

## 2023-07-11 LAB — IBC + FERRITIN
Ferritin: 5.8 ng/mL — ABNORMAL LOW (ref 10.0–291.0)
Iron: 42 ug/dL (ref 42–145)
Saturation Ratios: 9.8 % — ABNORMAL LOW (ref 20.0–50.0)
TIBC: 429.8 ug/dL (ref 250.0–450.0)
Transferrin: 307 mg/dL (ref 212.0–360.0)

## 2023-07-11 LAB — VITAMIN B12: Vitamin B-12: 687 pg/mL (ref 211–911)

## 2023-07-11 MED ORDER — OMEPRAZOLE 40 MG PO CPDR: 40.0000 mg | DELAYED_RELEASE_CAPSULE | Freq: Two times a day (BID) | ORAL | 5 refills | Status: DC

## 2023-07-11 NOTE — Telephone Encounter (Signed)
Reaching out to patient to offer assistance regarding upcoming cardiac imaging study; pt verbalizes understanding of appt date/time, parking situation and where to check in, pre-test NPO status and medications ordered, and verified current allergies; name and call back number provided for further questions should they arise Hayley Sharpe RN Navigator Cardiac Imaging [facility] [phone] office [phone] cell  

## 2023-07-11 NOTE — Progress Notes (Signed)
Chief Complaint:  Anemia, Dysphagia  HPI:    [name] is an 82 y/o Caucasian female with a past medical history as listed below including CAD (03/04/2022 echo with normal LVEF 60-65%), GERD, hypertension and multiple others, known to Dr. Lavon Paganini, who was referred to me by Kelsey Majestic, MD for a complaint of anemia and dysphagia.     6 /13/22 colonoscopy with three 7-11 mm polyps in the transverse colon and cecum, few colonic angioectasias and diverticulosis in the sigmoid and descending colon as well as nonbleeding external and internal hemorrhoids.  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  Patient told to use Omeprazole 40 daily for 3 months.    06/08/2023 fecal occult positive.    07/05/2023 CBC with a hemoglobin of 11.3 (10.7 on 06/22/2023, 11.6 on 06/01/2023, 12.6 on 05/14/2022).  MCV normal.    Today, the patient presents to clinic and tells me that she was recently told that she was anemic.  She does not recall any iron studies being done but she is on a B12 supplement daily which she did not take this morning.  Tells me she has not been seeing any blood in her stool and was sent here by her PCP to discuss possible procedures.    Also concerned about some dysphagia symptoms she has been having, occasionally when she eats she feels like food gets stuck in her throat and this has been occurring over the past 6 to 8 months.  Denies any overt heartburn or reflux symptoms.  She thinks she is just been on Omeprazole once daily and unsure of dosage, she thinks 40 mg.    Denies fever, chills, weight loss, abdominal pain, nausea or vomiting.  Past Medical History:  Diagnosis Date   Anginal pain (HCC) 08/29/2012   "have had problems w/this"   Anxiety 08/29/2012   Arthritis 08/29/2012   right hand, Seen by Dr. Corliss Skains   CAD (coronary artery disease)    nonobstructive   Cataract 03/30/07   10/16/09   Depression     Exertional dyspnea 08/29/2012   GERD (gastroesophageal reflux disease)    Hammer toe    surgery x2, second with Dr. Lajoyce Corners- only other option amputatoin   HLD (hyperlipidemia)    HTN (hypertension)    PNEUMONIA 07/04/2007   Qualifier: Diagnosis of  By: Drue Novel MD, Nolon Rod.    Syncope and collapse 08/29/2012   "first time ever"    Past Surgical History:  Procedure Laterality Date   ABDOMINAL HYSTERECTOMY  08/27/1969   APPENDECTOMY  ~ 1970   CATARACT EXTRACTION W/ INTRAOCULAR LENS  IMPLANT, BILATERAL  ~ 2010   bilateral   EYE SURGERY  03/30/07   10/16/09   TONSILLECTOMY AND ADENOIDECTOMY  08/27/1949   VEIN LIGATION AND STRIPPING  12/27/2010   bilaterally    Current Outpatient Medications  Medication Sig Dispense Refill   amLODipine (NORVASC) 10 MG tablet Take 1 tablet (10 mg total) by mouth daily. 90 tablet 3   aspirin 81 MG tablet Take 81 mg by mouth daily.      atorvastatin (LIPITOR) 80 MG tablet Take 1 tablet (80 mg total) by mouth every evening. 90 tablet 3   Cholecalciferol (VITAMIN D3) 50 MCG (2000 UT) TABS Take 1 tablet by mouth daily.     diclofenac sodium (VOLTAREN) 1 % GEL Apply 3 gm to 3 large joints up to 3 times a day.Dispense  3 tubes with 3 refills. 3 Tube 3   escitalopram (LEXAPRO) 20 MG tablet Take 1 tablet (20 mg total) by mouth daily. 90 tablet 3   ezetimibe (ZETIA) 10 MG tablet Take 1 tablet (10 mg total) by mouth daily. 90 tablet 3   metoprolol tartrate (LOPRESSOR) 100 MG tablet Take 2 hours prior to CT scan 1 tablet 0   omeprazole (PRILOSEC) 20 MG capsule TAKE ONE CAPSULE BY MOUTH IN THE MORNING AND ONE CAPSULE AT BEDTIME 180 capsule 0   omeprazole (PRILOSEC) 40 MG capsule TAKE 1 CAPSULE BY MOUTH DAILY EVERY MORNING AND AT BEDTIME 90 capsule 0   triamcinolone cream (KENALOG) 0.1 % Apply 1 Application topically 2 (two) times daily. For 7-10 days maximum 80 g 0   vitamin B-12 (CYANOCOBALAMIN) 1000 MCG tablet Take 1,000 mcg by mouth daily.     No current  facility-administered medications for this visit.    Allergies as of 07/11/2023 - Review Complete 07/11/2023  Allergen Reaction Noted   Clindamycin/lincomycin  12/01/2015   Trazodone and nefazodone Other (See Comments) 01/26/2017    Family History  Problem Relation Age of Onset   Heart disease Mother        brother, father   Diabetes Mother        sister, brother   Vision loss Mother    Suicidality Father        79   Depression Father    Heart disease Father    Vision loss Father    Coronary artery disease Brother    Colon cancer Brother    Stroke Brother    ADD / ADHD Brother    Anxiety disorder Brother    Cancer Brother    Depression Brother    Diabetes Brother    Alcoholism Brother    Alcohol abuse Brother    Depression Brother    Diabetes Brother    Vision loss Brother    Ovarian cancer Daughter    Arthritis Sister    Diabetes Sister    Hearing loss Sister    Diabetes Sister    Esophageal cancer Neg Hx    Rectal cancer Neg Hx    Stomach cancer Neg Hx    Liver cancer Neg Hx     Social History   Socioeconomic History   Marital status: Widowed    Spouse name: Not on file   Number of children: 3   Years of education: Not on file   Highest education level: Associate degree: occupational, Scientist, product/process development, or vocational program  Occupational History   Occupation: retired  Tobacco Use   Smoking status: Former    Current packs/day: 0.00    Average packs/day: 1 pack/day for 20.0 years (20.0 ttl pk-yrs)    Types: Cigarettes    Start date: 12/27/1968    Quit date: 12/27/1988    Years since quitting: 34.5    Passive exposure: Never   Smokeless tobacco: Never   Tobacco comments:    teenager  Vaping Use   Vaping status: Never Used  Substance and Sexual Activity   Alcohol use: No   Drug use: No   Sexual activity: Not Currently  Other Topics Concern   Not on file  Social History Narrative   Widowed 2013/10/26. Daughter died 2012/07/26. 2 living children. 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      Hobbies: gardening, time with dog, time with grandkids   Social Determinants of Health   Financial Resource Strain: Low Risk  (07/09/2023)   Overall Financial Resource Strain (CARDIA)    Difficulty of Paying Living Expenses: Not hard at all  Food Insecurity: No Food Insecurity (07/09/2023)   Hunger Vital Sign    Worried About Running Out of Food in the Last Year: Never true    Ran Out of Food in the Last Year: Never true  Transportation Needs: No Transportation Needs (07/09/2023)   PRAPARE - Administrator, Civil Service (Medical): No    Lack of Transportation (Non-Medical): No  Physical Activity: Sufficiently Active (07/09/2023)   Exercise Vital Sign    Days of Exercise per Week: 4 days    Minutes of Exercise per Session: 110 min  Stress: Stress Concern Present (07/09/2023)   [employer] of Occupational Health - Occupational Stress Questionnaire    Feeling of Stress : Very much  Social Connections: Moderately Isolated (07/09/2023)   Social Connection and Isolation Panel [NHANES]    Frequency of Communication with Friends and Family: More than three times a week    Frequency of Social Gatherings with Friends and Family: More than three times a week    Attends Religious Services: 1 to 4 times per year    Active Member of [employer] or Organizations: No    Attends Banker Meetings: Not on file    Marital Status: Widowed  Catering manager Violence: Not on file    Review of Systems:    Constitutional: No weight loss, fever or chills Cardiovascular: No chest pain Respiratory: No SOB Gastrointestinal: See HPI and otherwise negative   Physical Exam:  Vital signs: BP (!) 132/58   Pulse 72   Wt 160 lb (72.6 kg)   BMI 28.34 kg/m    Constitutional:   Pleasant Elderly Caucasian female appears to be  in NAD, Well developed, Well nourished, alert and cooperative Respiratory: Respirations even and unlabored. Lungs clear to auscultation bilaterally.   No wheezes, crackles, or rhonchi.  Cardiovascular: Normal S1, S2. No MRG. Regular rate and rhythm. No peripheral edema, cyanosis or pallor.  Gastrointestinal:  Soft, nondistended, nontender. No rebound or guarding. Normal bowel sounds. No appreciable masses or hepatomegaly. Rectal:  Not performed.  Psychiatric: Demonstrates good judgement and reason without abnormal affect or behaviors.  RELEVANT LABS AND IMAGING: CBC    Component Value Date/Time   WBC 5.9 07/05/2023 1031   RBC 4.17 07/05/2023 1031   HGB 11.3 (L) 07/05/2023 1031   HCT 33.9 (L) 07/05/2023 1031   PLT 280.0 07/05/2023 1031   MCV 81.3 07/05/2023 1031   MCH 30.2 04/21/2020 1316   MCHC 33.2 07/05/2023 1031   RDW 15.1 07/05/2023 1031   LYMPHSABS 1.8 07/05/2023 1031   MONOABS 0.3 07/05/2023 1031   EOSABS 0.1 07/05/2023 1031   BASOSABS 0.0 07/05/2023 1031    CMP     Component Value Date/Time   NA 140 06/01/2023 1133   NA 139 01/03/2015 0000   K 4.2 06/01/2023 1133   CL 104 06/01/2023 1133   CO2 23 06/01/2023 1133   GLUCOSE 90 06/01/2023 1133   BUN 17 06/01/2023 1133   BUN 16 01/03/2015 0000   CREATININE 0.72 06/01/2023 1133   CALCIUM 9.3 06/01/2023 1133   PROT 6.8 06/01/2023 1133   ALBUMIN 4.1 06/01/2023 1133   AST 22 06/01/2023 1133   ALT 13  06/01/2023 1133   ALKPHOS 70 06/01/2023 1133   BILITOT 0.8 06/01/2023 1133   GFRNONAA >60 04/21/2020 1316   GFRAA >60 04/21/2020 1316    Assessment: 1.  Anemia: Hemoglobin decreased a few months ago, has actually increased over the past month or so on a B12 supplement, positive Hemoccult testing, recent EGD and colonoscopy; consider chronic blood loss anemia from known AVMs versus other 2.  Dysphagia: Increasing symptoms over the past 6 to 8 months, previously normal EGD 2 years ago; consider dysmotility versus stricture  versus other 3.  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  Positive Hemoccult testing: Likely due to known AVMs, recent colonoscopy 2 years ago  Plan: 1.  Discussed with patient that she just had a full EGD and colonoscopy in June 2022, I do not think we need to repeat these things now.  We did find AVMs on her colonoscopy which are likely contributing to her anemia and Hemoccult positive stool.  She is not seeing any overt bleeding.  Her hemoglobin has actually increased over the past couple of weeks just with a B12 supplement. 2.  Ordered labs today to include a repeat CBC and iron studies as well as B12 and folate.  Pending results from this may need to just add an iron supplement and see how she does.  Discussed that we will recheck labs in another 2 to 3 months and have a follow-up visit.  At that point can discuss if we need to repeat any endoscopic workup.  She was happy with this plan. 3.  Ordered a barium esophagram for further evaluation of dysphagia symptoms.  She did just have an EGD 2 years ago.  For now reviewed antidysphagia measures. 4.  Also started Omeprazole 40 mg twice daily, 30-60 minutes before breakfast and dinner.  #60 with 5 refills. 5.  Patient to follow in clinic with me in 2 to 3 months.  Hyacinth Meeker, PA-C [facility] 07/11/2023, 8:57 AM  Cc: Kelsey Majestic, MD

## 2023-07-11 NOTE — Patient Instructions (Signed)
  You have been scheduled for a Barium Esophogram at [facility] Radiology (1st floor of the hospital) on 07/26/2023 at 9:00am . Please arrive 30 minutes prior to your appointment for registration. Make certain not to have anything to eat or drink 3 hours prior to your test. If you need to reschedule for any reason, please contact radiology at [phone] to do so. __________________________________________________________________ A barium swallow is an examination that concentrates on views of the esophagus. This tends to be a double contrast exam (barium and two liquids which, when combined, create a gas to distend the wall of the oesophagus) or single contrast (non-ionic iodine based). The study is usually tailored to your symptoms so a good history is essential. Attention is paid during the study to the form, structure and configuration of the esophagus, looking for functional disorders (such as aspiration, dysphagia, achalasia, motility and reflux) EXAMINATION You may be asked to change into a gown, depending on the type of swallow being performed. A radiologist and radiographer will perform the procedure. The radiologist will advise you of the type of contrast selected for your procedure and direct you during the exam. You will be asked to stand, sit or lie in several different positions and to hold a small amount of fluid in your mouth before being asked to swallow while the imaging is performed .In some instances you may be asked to swallow barium coated marshmallows to assess the motility of a solid food bolus. The exam can be recorded as a digital or video fluoroscopy procedure. POST PROCEDURE It will take 1-2 days for the barium to pass through your system. To facilitate this, it is important, unless otherwise directed, to increase your fluids for the next 24-48hrs and to resume your normal diet.  This test typically takes about 30 minutes to  perform. __________________________________________________________________________________   Your provider has requested that you go to the basement level for lab work before leaving today. Press "B" on the elevator. The lab is located at the first door on the left as you exit the elevator.  _______________________________________________________  If your blood pressure at your visit was 140/90 or greater, please contact your primary care physician to follow up on this.  _______________________________________________________  If you are age 81 or older, your body mass index should be between 23-30. Your Body mass index is 28.34 kg/m. If this is out of the aforementioned range listed, please consider follow up with your Primary Care Provider.  If you are age 103 or younger, your body mass index should be between 19-25. Your Body mass index is 28.34 kg/m. If this is out of the aformentioned range listed, please consider follow up with your Primary Care Provider.   ________________________________________________________  The [facility] providers would like to encourage you to use [facility] to communicate with providers for non-urgent requests or questions.  Due to long hold times on the telephone, sending your provider a message by [facility] may be a faster and more efficient way to get a response.  Please allow 48 business hours for a response.  Please remember that this is for non-urgent requests.  _______________________________________________________ It was a pleasure to see you today!  Thank you for trusting me with your gastrointestinal care!

## 2023-07-12 ENCOUNTER — Other Ambulatory Visit: Payer: Self-pay | Admitting: Cardiology

## 2023-07-12 ENCOUNTER — Ambulatory Visit (HOSPITAL_COMMUNITY)
Admission: RE | Admit: 2023-07-12 | Discharge: 2023-07-12 | Disposition: A | Discharge status: Home / Self Care | Payer: Medicare HMO | Source: Ambulatory Visit | Attending: Cardiology | Admitting: Cardiology

## 2023-07-12 ENCOUNTER — Ambulatory Visit (HOSPITAL_BASED_OUTPATIENT_CLINIC_OR_DEPARTMENT_OTHER)
Admission: RE | Admit: 2023-07-12 | Discharge: 2023-07-12 | Disposition: A | Discharge status: Home / Self Care | Payer: Medicare HMO | Source: Ambulatory Visit | Attending: Cardiology | Admitting: Cardiology

## 2023-07-12 DIAGNOSIS — R931 Abnormal findings on diagnostic imaging of heart and coronary circulation: Secondary | ICD-10-CM

## 2023-07-12 DIAGNOSIS — I2510 Atherosclerotic heart disease of native coronary artery without angina pectoris: Secondary | ICD-10-CM | POA: Diagnosis not present

## 2023-07-12 DIAGNOSIS — R072 Precordial pain: Secondary | ICD-10-CM

## 2023-07-12 MED ORDER — IOHEXOL 350 MG/ML SOLN
100.0000 mL | Freq: Once | INTRAVENOUS | Status: AC | PRN
  Administered 2023-07-12: 100 mL via INTRAVENOUS

## 2023-07-12 MED ORDER — NITROGLYCERIN 0.4 MG SL SUBL
SUBLINGUAL_TABLET | SUBLINGUAL | Status: AC
  Filled 2023-07-12: qty 2

## 2023-07-12 MED ORDER — NITROGLYCERIN 0.4 MG SL SUBL
0.8000 mg | SUBLINGUAL_TABLET | Freq: Once | SUBLINGUAL | Status: AC
  Administered 2023-07-12: 0.8 mg via SUBLINGUAL

## 2023-07-13 ENCOUNTER — Other Ambulatory Visit: Payer: Self-pay | Admitting: Family Medicine

## 2023-07-13 ENCOUNTER — Telehealth: Payer: Self-pay | Admitting: Cardiology

## 2023-07-13 ENCOUNTER — Ambulatory Visit: Payer: Medicare HMO | Admitting: Family Medicine

## 2023-07-13 ENCOUNTER — Encounter: Payer: Self-pay | Admitting: Family Medicine

## 2023-07-13 VITALS — BP 130/62 | HR 60 | Temp 97.3°F | Ht 63.0 in | Wt 191.2 lb

## 2023-07-13 DIAGNOSIS — D509 Iron deficiency anemia, unspecified: Secondary | ICD-10-CM | POA: Diagnosis not present

## 2023-07-13 DIAGNOSIS — R1310 Dysphagia, unspecified: Secondary | ICD-10-CM

## 2023-07-13 DIAGNOSIS — I25118 Atherosclerotic heart disease of native coronary artery with other forms of angina pectoris: Secondary | ICD-10-CM

## 2023-07-13 DIAGNOSIS — I10 Essential (primary) hypertension: Secondary | ICD-10-CM

## 2023-07-13 DIAGNOSIS — E7800 Pure hypercholesterolemia, unspecified: Secondary | ICD-10-CM

## 2023-07-13 MED ORDER — RAMELTEON 8 MG PO TABS: 8.0000 mg | ORAL_TABLET | Freq: Every day | ORAL | 5 refills | Status: DC

## 2023-07-13 NOTE — Patient Instructions (Addendum)
Phone: [phone] to schedule hematology visit  Call cardiology back- they want you to have another visit to review notes but with your symptoms I would lean heavily towards catheterization   Recommended follow up: Return in about 6 months (around 01/13/2024) for followup or sooner if needed.Schedule b4 you leave.

## 2023-07-13 NOTE — Telephone Encounter (Signed)
Pt is requesting a callback regarding a MyChart message she was told she received but doesn't see anything. Please advise

## 2023-07-13 NOTE — Telephone Encounter (Signed)
 Left message for pt to call.

## 2023-07-13 NOTE — Progress Notes (Signed)
Phone [phone] In person visit   Subjective:   [name] is a 82 y.o. year old very pleasant female patient who presents for/with See problem oriented charting Chief Complaint  Patient presents with   Hypertension    Pt is here for 1 month f/u on HTN   Past Medical History-  Patient Active Problem List   Diagnosis Date Noted   Advanced directives, counseling/discussion 01/26/2017    Priority: High   nonobstructive CAD (coronary artery disease) 12/01/2015    Priority: High   Memory loss 12/01/2015    Priority: High   Itchy skin 05/30/2020    Priority: Medium    History of adenomatous polyp of colon 08/01/2017    Priority: Medium    Varicose veins of both lower extremities 12/01/2015    Priority: Medium    Hyperlipidemia, unspecified 05/19/2007    Priority: Medium    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HCC) 05/19/2007    Priority: Medium    Essential hypertension 05/19/2007    Priority: Medium    Insomnia 12/01/2015    Priority: Low   Glaucoma 12/01/2015    Priority: Low   Hammer toe     Priority: Low   Arthritis 08/29/2012    Priority: Low   GERD 09/06/2009    Priority: Low   Edema 09/06/2009    Priority: Low   History of syncope 09/06/2009    Priority: Low   Hyperglycemia 05/19/2007    Priority: Low   Trigger finger, right middle finger 09/05/2017    Priority: 1.   Primary osteoarthritis of both knees 03/10/2017    Priority: 1.   Primary osteoarthritis of both hands 03/09/2017    Priority: 1.   Primary osteoarthritis of both feet 03/09/2017    Priority: 1.   Diaphoresis 05/12/2018   Rheumatoid factor positive 03/09/2017    Medications- reviewed and updated Current Outpatient Medications  Medication Sig Dispense Refill   amLODipine (NORVASC) 10 MG tablet Take 1 tablet (10 mg total) by mouth daily. 90 tablet 3   aspirin 81 MG tablet Take 81 mg by mouth daily.      atorvastatin (LIPITOR) 80 MG tablet Take 1 tablet (80 mg total) by mouth  every evening. 90 tablet 3   Cholecalciferol (VITAMIN D3) 50 MCG (2000 UT) TABS Take 1 tablet by mouth daily.     diclofenac sodium (VOLTAREN) 1 % GEL Apply 3 gm to 3 large joints up to 3 times a day.Dispense 3 tubes with 3 refills. 3 Tube 3   escitalopram (LEXAPRO) 20 MG tablet Take 1 tablet (20 mg total) by mouth daily. 90 tablet 3   ezetimibe (ZETIA) 10 MG tablet Take 1 tablet (10 mg total) by mouth daily. 90 tablet 3   metoprolol tartrate (LOPRESSOR) 100 MG tablet Take 2 hours prior to CT scan 1 tablet 0   omeprazole (PRILOSEC) 40 MG capsule Take 1 capsule (40 mg total) by mouth 2 (two) times daily. 60 capsule 5   ramelteon (ROZEREM) 8 MG tablet Take 1 tablet (8 mg total) by mouth at bedtime. 30 tablet 5   triamcinolone cream (KENALOG) 0.1 % Apply 1 Application topically 2 (two) times daily. For 7-10 days maximum 80 g 0   vitamin B-12 (CYANOCOBALAMIN) 1000 MCG tablet Take 1,000 mcg by mouth daily.     omeprazole (PRILOSEC) 20 MG capsule TAKE ONE CAPSULE BY MOUTH IN THE MORNING AND ONE CAPSULE AT BEDTIME 180 capsule 0   No current facility-administered medications for this visit.  Objective:  BP 130/62   Pulse 60   Temp (!) 97.3 F (36.3 C)   Ht 5\' 3"  (1.6 m)   Wt 191 lb 3.2 oz (86.7 kg)   SpO2 95%   BMI 33.87 kg/m  Gen: NAD, resting comfortably CV: RRR stable murmur Lungs: CTAB no crackles, wheeze, rhonchi Ext: trace edema Skin: warm, dry     Assessment and Plan   #social update- balloon festival in October in [place] - she is excited for this  # CAD  #hyperlipidemia S: Medication:Atorvastatin 80 mg, aspirin 81 mg -zetia added June 2024 -she has intentionally reduced her walking due to chest discomfort - she is not having as much chest discomfort as a result but is not as active as she wants to be.  Lab Results  Component Value Date   CHOL 160 06/01/2023   HDL 59.30 06/01/2023   LDLCALC 77 06/01/2023   LDLDIRECT 65.0 05/30/2020   TRIG 120.0 06/01/2023    CHOLHDL 3 06/01/2023  A/P: CAD previously asymptomatic and nonobstructive-have recently started to have chest pain with exertion and she has limited her activity as a result and chest pain have decreased-she was also started on higher dose of amlodipine and wonder if that could have contributed to improvement-still able to do water aerobics without significant issue but walking at a brisk pace or up stairs can cause issues - Recent CT coronary with possible stenosis in the LAD-encouraged her to call cardiology back-I suspect she may need catheterization-if worsening pain she should seek care immediately   #hypertension S: medication: Amlodipine 2.5 mg--> 10 mg  Home readings #s: no recent checks BP Readings from Last 3 Encounters:  07/13/23 130/62  07/12/23 109/61  07/11/23 (!) 132/58  A/P: blood pressure much improved/looks good in office continue current medications   # GERD S:Medication: Omeprazole 20 mg--> 40 mg 11/10/22 B12 levels related to PPI use: Takes B12 prophylactically -still with trouble swallowing A/P: Has upcoming swallowing evaluation with GI  #insomnia- only has issues when she is alone- when family in town or [place] are at [employer] together she sleeps quite well. Melatonin has not been helpful. Trazodone caused too much drowsiness next day. She read about rozerem and wants to trial- may try RX solution or goodrx-I sent in a milligrams  # Anemia-has seen GI and with recent endoscopic evaluation within about 2 years they opted to monitor for now-does have iron deficiency and being referred to hematology for potential infusions.  Mild anemia could contribute to chest discomfort issues  Recommended follow up: Return in about 6 months (around 01/13/2024) for followup or sooner if needed.Schedule b4 you leave. Future Appointments  Date Time Provider Department Center  07/26/2023  9:00 AM [facility] 1 [facility] [place]  08/16/2023  9:15 AM Jodelle Gross, NP [facility] None   09/20/2023 11:00 AM Unk Lightning, PA [facility]  01/13/2024  9:20 AM Shelva Majestic, MD [facility]  04/12/2024  3:20 PM Pollyann Savoy, MD [facility] None    Lab/Order associations:   ICD-10-CM   1. Essential hypertension  I10     2. Pure hypercholesterolemia  E78.00     3. Iron deficiency anemia, unspecified iron deficiency anemia type  D50.9     4. Dysphagia, unspecified type  R13.10     5. Coronary artery disease of native artery of native heart with stable angina pectoris (HCC)  I25.118       Meds ordered this encounter  Medications  ramelteon (ROZEREM) 8 MG tablet    Sig: Take 1 tablet (8 mg total) by mouth at bedtime.    Dispense:  30 tablet    Refill:  5    Return precautions advised.  Tana Conch, MD

## 2023-07-14 NOTE — Telephone Encounter (Signed)
Call to patient, straight to VM.  LM to call office

## 2023-07-14 NOTE — Telephone Encounter (Signed)
Patient is returning call.  °

## 2023-07-15 NOTE — Telephone Encounter (Signed)
Left voicemail for patient to return call to office. 

## 2023-07-15 NOTE — Telephone Encounter (Signed)
Patient is returning call.  °

## 2023-07-18 ENCOUNTER — Telehealth: Payer: Self-pay | Admitting: Physician Assistant

## 2023-07-18 NOTE — Telephone Encounter (Signed)
Inbound call from patient stating she was referred out to have iron infusions. States she has not heard anything to schedule an appointment. Requesting a call back to retreive office information so she can give the office a call to schedule. Please advise, thank you.

## 2023-07-18 NOTE — Telephone Encounter (Signed)
Patient is returning call. States she would still like to be seen sooner for issues. Requesting call back.

## 2023-07-18 NOTE — Telephone Encounter (Signed)
Left voicemail message to call the clinic. 

## 2023-07-19 NOTE — Telephone Encounter (Signed)
Patient is scheduled for 7/26 with T. Asa Lente.  There are no sooner appt.s

## 2023-07-20 ENCOUNTER — Other Ambulatory Visit: Payer: Self-pay | Admitting: Family Medicine

## 2023-07-20 DIAGNOSIS — K219 Gastro-esophageal reflux disease without esophagitis: Secondary | ICD-10-CM

## 2023-07-21 ENCOUNTER — Ambulatory Visit: Payer: Medicare HMO | Attending: Adult Health | Admitting: Physician Assistant

## 2023-07-21 VITALS — BP 134/68 | HR 81 | Ht 63.0 in | Wt 185.4 lb

## 2023-07-21 DIAGNOSIS — R072 Precordial pain: Secondary | ICD-10-CM | POA: Diagnosis not present

## 2023-07-21 DIAGNOSIS — I2510 Atherosclerotic heart disease of native coronary artery without angina pectoris: Secondary | ICD-10-CM | POA: Diagnosis not present

## 2023-07-21 DIAGNOSIS — I6523 Occlusion and stenosis of bilateral carotid arteries: Secondary | ICD-10-CM | POA: Diagnosis not present

## 2023-07-21 DIAGNOSIS — E785 Hyperlipidemia, unspecified: Secondary | ICD-10-CM

## 2023-07-21 DIAGNOSIS — I10 Essential (primary) hypertension: Secondary | ICD-10-CM | POA: Diagnosis not present

## 2023-07-21 MED ORDER — NITROGLYCERIN 0.4 MG SL SUBL: 0.4000 mg | SUBLINGUAL_TABLET | SUBLINGUAL | 3 refills | Status: DC | PRN

## 2023-07-21 NOTE — Progress Notes (Signed)
Cardiology Office Note:  .   Date:  07/21/2023  ID:  [name], DOB March 27, 1941, MRN [id number] PCP: Kelsey Majestic, MD  [facility] Providers Cardiologist:  Kelsey Millers, MD {  History of Present Illness: .   [name] is a 82 y.o. female with a past medical history of CAD who presents for follow-up.    History includes cardiac catheterization in 2006 which showed 30% LAD, 30% circumflex and irregularities in the right coronary artery.  LVEF 60%.  Previous abdominal ultrasound that showed no aneurysm October 2007.  Nuclear study October 2020 showed ejection fraction 72% and normal perfusion.  ABIs March 2022 showed abnormal toe brachial index on the left.  Monitor February 2023 showed normal sinus rhythm with rare PACs and PVCs, occasional short run of SVT (longest 17 beats, nonsustained with ventricular tachycardia longest 7 beats).  Echocardiogram March 2023 showed normal LVEF, mild left ventricular hypertrophy, mild to moderate mitral regurgitation.  Carotid Dopplers July 2023 showed 1 to 39% bilateral stenosis.  Recently found to be anemic with heme positive stool and referred to GI.  Seen June 2024 and patient noticed 1 month history of chest pressure with more vigorous activities, relieved with rest.  Also, with DOE.  This did not occur with routine activities or at rest.  Denied orthopnea, PND, pedal edema, or syncope.  Today, she states that she is having some exertional chest pain that goes away with rest.  We reviewed her coronary CTA and she had a positive FFR.  We discussed possible options.  She states she has some travel coming up but not until September.  She is not on any antianginal medications.  Blood pressure well-controlled today on exam.  Also has some associated diaphoresis.  Reports no shortness of breath nor dyspnea on exertion. No edema, orthopnea, PND. Reports no palpitations.   ROS: Pertinent ROS in HPI  Studies Reviewed: .         07/12/2023 CCTA  FINDINGS: FFRct analysis was performed on the original cardiac CT angiogram dataset. Diagrammatic representation of the FFRct analysis is provided in a separate PDF document in PACS. This dictation was created using the PDF document and an interactive 3D model of the results. 3D model is not available in the EMR/PACS. Normal FFR range is >0.80.   1. Left Main: findings   2. LAD: findings 0.71   3. LCX: findings 0.88, 0.86   4.      RCA: findings 0.90   IMPRESSION: FFR abnormal in the distal LAD suggestive of flow limiting lesion in the mid to distal vessel.   Note: These examples are not recommendations of HeartFlow and only provided as examples of what other customers are doing. MPRESSION: 1. Coronary calcium score of 654. This was 9 percentile for age-, sex, and race-matched controls.   2. Normal coronary origin with right dominance.   3. Moderate (50-60) stenoses in mid to distal LAD and D1; mild (25-49) stenoses in the distal LM, LAD, RCA, PDA and posterolateral.   4. Aortic atherosclerosis.   5. Dilated pulmonary artery (34 mm) suggestive of pulmonary hypertension.   6. Study will be sent for FFR.      Physical Exam:   VS:  There were no vitals taken for this visit.   Wt Readings from Last 3 Encounters:  07/13/23 191 lb 3.2 oz (86.7 kg)  07/11/23 160 lb (72.6 kg)  07/04/23 191 lb (86.6 kg)    GEN: Well nourished, well developed  in no acute distress NECK: No JVD; No carotid bruits CARDIAC: RRR, no murmurs, rubs, gallops RESPIRATORY:  Clear to auscultation without rales, wheezing or rhonchi  ABDOMEN: Soft, non-tender, non-distended EXTREMITIES:  No edema; No deformity   ASSESSMENT AND PLAN: .   1.  Chest pain/CAD -exertional chest  -Arrange for cardiac catheterization -Continue aspirin 81 mg daily, amlodipine 10 mg daily, Lipitor 80 mg daily, Zetia 10 mg daily -add nitroglycerin PRN  2.  Hypertension -well controlled  today -continue current medications  3.  Hyperlipidemia -LDL < 55 -re-check lipid panel after PharmD appointment  4.  Carotid artery disease -[place] scheduled    Informed Consent   Shared Decision Making/Informed Consent{  The risks [stroke (1 in 1000), death (1 in 1000), kidney failure [usually temporary] (1 in 500), bleeding (1 in 200), allergic reaction [possibly serious] (1 in 200)], benefits (diagnostic support and management of coronary artery disease) and alternatives of a cardiac catheterization were discussed in detail with [name] and she is willing to proceed.     Dispo: Follow-up after cardiac catheterization  Signed, Kelsey Dory, PA-C

## 2023-07-21 NOTE — Patient Instructions (Addendum)
Medication Instructions:  Start Nitroglycerin 0.4 mg every 5 minutes as needed for chest pain   *If you need a refill on your cardiac medications before your next appointment, please call your pharmacy*   Lab Work: Your physician recommends that you return for lab work on Friday, August 2nd. Lab is open from 7:15 am to 4:30 pm   If you have labs (blood work) drawn today and your tests are completely normal, you will receive your results only by: MyChart Message (if you have MyChart) OR A paper copy in the mail If you have any lab test that is abnormal or we need to change your treatment, we will call you to review the results.   Testing/Procedures:  [facility] A DEPT OF [place]. [facility] AT [facility] [address] [place], [place] [address] [place] [address] Dept: [phone] Loc: [phone]  [name]  07/21/2023  You are scheduled for a Cardiac Catheterization on Monday, August 5 with Dr. Verne Carrow.  1. Please arrive at the [facility] (Main Entrance A) at [facility]: [address] [place], [place] [address] at 5:30 AM (This time is 2 hour(s) before your procedure to ensure your preparation). Free valet parking service is available. You will check in at ADMITTING. The support person will be asked to wait in the waiting room.  It is OK to have someone drop you off and come back when you are ready to be discharged.    Special note: Every effort is made to have your procedure done on time. Please understand that emergencies sometimes delay scheduled procedures.  2. Diet: Do not eat solid foods after midnight.  The patient may have clear liquids until 5am upon the day of the procedure.  3. Medication instructions in preparation for your procedure:   Contrast Allergy: No  On the morning of your procedure, take your Aspirin 81 mg and any morning medicines NOT listed above.  You may use sips of water.  5.  Plan to go home the same day, you will only stay overnight if medically necessary. 6. Bring a current list of your medications and current insurance cards. 7. You MUST have a responsible person to drive you home. 8. Someone MUST be with you the first 24 hours after you arrive home or your discharge will be delayed. 9. Please wear clothes that are easy to get on and off and wear slip-on shoes.  Thank you for allowing [place] to care for you!   -- [facility]    Follow-Up: At [facility], you and your health needs are our priority.  As part of our continuing mission to provide you with exceptional heart care, we have created designated Provider Care Teams.  These Care Teams include your primary Cardiologist (physician) and Advanced Practice Providers (APPs -  Physician Assistants and Nurse Practitioners) who all work together to provide you with the care you need, when you need it.  We recommend signing up for the patient portal called "MyChart".  Sign up information is provided on this After Visit Summary.  MyChart is used to connect with patients for Virtual Visits (Telemedicine).  Patients are able to view lab/test results, encounter notes, upcoming appointments, etc.  Non-urgent messages can be sent to your provider as well.   To learn more about what you can do with MyChart, go to [email or web address].    Your next appointment:   POST PROCEDURAL FOLLOW-UP

## 2023-07-21 NOTE — H&P (View-Only) (Signed)
Cardiology Office Note:  .   Date:  07/21/2023  ID:  [name], DOB March 27, 1941, MRN [id number] PCP: [place] Majestic, MD  [facility] Providers Cardiologist:  Olga Millers, MD {  History of Present Illness: .   [name] is a 82 y.o. female with a past medical history of CAD who presents for follow-up.    History includes cardiac catheterization in 2006 which showed 30% LAD, 30% circumflex and irregularities in the right coronary artery.  LVEF 60%.  Previous abdominal ultrasound that showed no aneurysm October 2007.  Nuclear study October 2020 showed ejection fraction 72% and normal perfusion.  ABIs March 2022 showed abnormal toe brachial index on the left.  Monitor February 2023 showed normal sinus rhythm with rare PACs and PVCs, occasional short run of SVT (longest 17 beats, nonsustained with ventricular tachycardia longest 7 beats).  Echocardiogram March 2023 showed normal LVEF, mild left ventricular hypertrophy, mild to moderate mitral regurgitation.  Carotid Dopplers July 2023 showed 1 to 39% bilateral stenosis.  Recently found to be anemic with heme positive stool and referred to GI.  Seen June 2024 and patient noticed 1 month history of chest pressure with more vigorous activities, relieved with rest.  Also, with DOE.  This did not occur with routine activities or at rest.  Denied orthopnea, PND, pedal edema, or syncope.  Today, she states that she is having some exertional chest pain that goes away with rest.  We reviewed her coronary CTA and she had a positive FFR.  We discussed possible options.  She states she has some travel coming up but not until September.  She is not on any antianginal medications.  Blood pressure well-controlled today on exam.  Also has some associated diaphoresis.  Reports no shortness of breath nor dyspnea on exertion. No edema, orthopnea, PND. Reports no palpitations.   ROS: Pertinent ROS in HPI  Studies Reviewed: .         07/12/2023 CCTA  FINDINGS: FFRct analysis was performed on the original cardiac CT angiogram dataset. Diagrammatic representation of the FFRct analysis is provided in a separate PDF document in PACS. This dictation was created using the PDF document and an interactive 3D model of the results. 3D model is not available in the EMR/PACS. Normal FFR range is >0.80.   1. Left Main: findings   2. LAD: findings 0.71   3. LCX: findings 0.88, 0.86   4.      RCA: findings 0.90   IMPRESSION: FFR abnormal in the distal LAD suggestive of flow limiting lesion in the mid to distal vessel.   Note: These examples are not recommendations of HeartFlow and only provided as examples of what other customers are doing. MPRESSION: 1. Coronary calcium score of 654. This was 9 percentile for age-, sex, and race-matched controls.   2. Normal coronary origin with right dominance.   3. Moderate (50-60) stenoses in mid to distal LAD and D1; mild (25-49) stenoses in the distal LM, LAD, RCA, PDA and posterolateral.   4. Aortic atherosclerosis.   5. Dilated pulmonary artery (34 mm) suggestive of pulmonary hypertension.   6. Study will be sent for FFR.      Physical Exam:   VS:  There were no vitals taken for this visit.   Wt Readings from Last 3 Encounters:  07/13/23 191 lb 3.2 oz (86.7 kg)  07/11/23 160 lb (72.6 kg)  07/04/23 191 lb (86.6 kg)    GEN: Well nourished, well developed  in no acute distress NECK: No JVD; No carotid bruits CARDIAC: RRR, no murmurs, rubs, gallops RESPIRATORY:  Clear to auscultation without rales, wheezing or rhonchi  ABDOMEN: Soft, non-tender, non-distended EXTREMITIES:  No edema; No deformity   ASSESSMENT AND PLAN: .   1.  Chest pain/CAD -exertional chest  -Arrange for cardiac catheterization -Continue aspirin 81 mg daily, amlodipine 10 mg daily, Lipitor 80 mg daily, Zetia 10 mg daily -add nitroglycerin PRN  2.  Hypertension -well controlled  today -continue current medications  3.  Hyperlipidemia -LDL < 55 -re-check lipid panel after PharmD appointment  4.  Carotid artery disease -[place] scheduled    Informed Consent   Shared Decision Making/Informed Consent{  The risks [stroke (1 in 1000), death (1 in 1000), kidney failure [usually temporary] (1 in 500), bleeding (1 in 200), allergic reaction [possibly serious] (1 in 200)], benefits (diagnostic support and management of coronary artery disease) and alternatives of a cardiac catheterization were discussed in detail with [name] and she is willing to proceed.     Dispo: Follow-up after cardiac catheterization  Signed, Sharlene Dory, PA-C

## 2023-07-26 ENCOUNTER — Other Ambulatory Visit: Payer: Self-pay

## 2023-07-26 ENCOUNTER — Ambulatory Visit: Payer: Medicare HMO | Admitting: Family Medicine

## 2023-07-26 ENCOUNTER — Ambulatory Visit (HOSPITAL_COMMUNITY)
Admission: RE | Admit: 2023-07-26 | Discharge: 2023-07-26 | Disposition: A | Discharge status: Home / Self Care | Payer: Medicare HMO | Source: Ambulatory Visit | Attending: Physician Assistant | Admitting: Physician Assistant

## 2023-07-26 DIAGNOSIS — K224 Dyskinesia of esophagus: Secondary | ICD-10-CM

## 2023-07-26 DIAGNOSIS — I517 Cardiomegaly: Secondary | ICD-10-CM | POA: Diagnosis not present

## 2023-07-26 DIAGNOSIS — K219 Gastro-esophageal reflux disease without esophagitis: Secondary | ICD-10-CM

## 2023-07-26 DIAGNOSIS — R1310 Dysphagia, unspecified: Secondary | ICD-10-CM

## 2023-07-26 DIAGNOSIS — D509 Iron deficiency anemia, unspecified: Secondary | ICD-10-CM | POA: Diagnosis not present

## 2023-07-26 DIAGNOSIS — K449 Diaphragmatic hernia without obstruction or gangrene: Secondary | ICD-10-CM | POA: Diagnosis not present

## 2023-07-26 DIAGNOSIS — K2100 Gastro-esophageal reflux disease with esophagitis, without bleeding: Secondary | ICD-10-CM | POA: Diagnosis not present

## 2023-07-28 ENCOUNTER — Ambulatory Visit: Payer: Medicare HMO

## 2023-07-29 ENCOUNTER — Ambulatory Visit: Payer: Medicare HMO | Attending: Physician Assistant

## 2023-07-29 ENCOUNTER — Encounter (HOSPITAL_COMMUNITY): Payer: Self-pay | Admitting: Cardiovascular Disease

## 2023-07-29 DIAGNOSIS — I10 Essential (primary) hypertension: Secondary | ICD-10-CM

## 2023-07-29 LAB — CBC
Hematocrit: 35.2 % (ref 34.0–46.6)
Hemoglobin: 11.8 g/dL (ref 11.1–15.9)
MCH: 27.1 pg (ref 26.6–33.0)
MCHC: 33.5 g/dL (ref 31.5–35.7)
MCV: 81 fL (ref 79–97)
Platelets: 314 10*3/uL (ref 150–450)
RBC: 4.35 x10E6/uL (ref 3.77–5.28)
RDW: 16.2 % — ABNORMAL HIGH (ref 11.7–15.4)
WBC: 6.6 10*3/uL (ref 3.4–10.8)

## 2023-07-29 LAB — BASIC METABOLIC PANEL
BUN/Creatinine Ratio: 16 (ref 12–28)
BUN: 14 mg/dL (ref 8–27)
CO2: 26 mmol/L (ref 20–29)
Calcium: 9.5 mg/dL (ref 8.7–10.3)
Chloride: 105 mmol/L (ref 96–106)
Creatinine, Ser: 0.9 mg/dL (ref 0.57–1.00)
Glucose: 106 mg/dL — ABNORMAL HIGH (ref 70–99)
Potassium: 4.2 mmol/L (ref 3.5–5.2)
Sodium: 141 mmol/L (ref 134–144)
eGFR: 64 mL/min/{1.73_m2} (ref >59)

## 2023-08-01 ENCOUNTER — Other Ambulatory Visit: Payer: Self-pay

## 2023-08-01 ENCOUNTER — Encounter (HOSPITAL_COMMUNITY)
Admission: RE | Disposition: A | Discharge status: Home / Self Care | Payer: Self-pay | Source: Ambulatory Visit | Attending: Cardiovascular Disease

## 2023-08-01 ENCOUNTER — Ambulatory Visit (HOSPITAL_COMMUNITY)
Admission: RE | Admit: 2023-08-01 | Discharge: 2023-08-01 | Disposition: A | Discharge status: Home / Self Care | Payer: Medicare HMO | Source: Ambulatory Visit | Attending: Cardiovascular Disease | Admitting: Cardiovascular Disease

## 2023-08-01 DIAGNOSIS — I2584 Coronary atherosclerosis due to calcified coronary lesion: Secondary | ICD-10-CM | POA: Diagnosis not present

## 2023-08-01 DIAGNOSIS — E785 Hyperlipidemia, unspecified: Secondary | ICD-10-CM | POA: Diagnosis not present

## 2023-08-01 DIAGNOSIS — I25110 Atherosclerotic heart disease of native coronary artery with unstable angina pectoris: Secondary | ICD-10-CM | POA: Insufficient documentation

## 2023-08-01 DIAGNOSIS — I2510 Atherosclerotic heart disease of native coronary artery without angina pectoris: Secondary | ICD-10-CM | POA: Diagnosis not present

## 2023-08-01 DIAGNOSIS — I779 Disorder of arteries and arterioles, unspecified: Secondary | ICD-10-CM | POA: Diagnosis not present

## 2023-08-01 DIAGNOSIS — Z79899 Other long term (current) drug therapy: Secondary | ICD-10-CM | POA: Insufficient documentation

## 2023-08-01 DIAGNOSIS — I10 Essential (primary) hypertension: Secondary | ICD-10-CM | POA: Insufficient documentation

## 2023-08-01 DIAGNOSIS — Z7982 Long term (current) use of aspirin: Secondary | ICD-10-CM | POA: Diagnosis not present

## 2023-08-01 HISTORY — PX: LEFT HEART CATH AND CORONARY ANGIOGRAPHY: CATH118249

## 2023-08-01 HISTORY — DX: Type 2 diabetes mellitus without complications: E11.9

## 2023-08-01 LAB — GLUCOSE, CAPILLARY
Glucose-Capillary: 94 mg/dL (ref 70–99)
Glucose-Capillary: 98 mg/dL (ref 70–99)

## 2023-08-01 SURGERY — LEFT HEART CATH AND CORONARY ANGIOGRAPHY
Anesthesia: LOCAL

## 2023-08-01 MED ORDER — HEPARIN (PORCINE) IN NACL 1000-0.9 UT/500ML-% IV SOLN
INTRAVENOUS | Status: DC | PRN
  Administered 2023-08-01 (×2): 500 mL

## 2023-08-01 MED ORDER — HEPARIN SODIUM (PORCINE) 1000 UNIT/ML IJ SOLN
INTRAMUSCULAR | Status: AC
  Filled 2023-08-01: qty 10

## 2023-08-01 MED ORDER — ASPIRIN 81 MG PO CHEW: 81.0000 mg | CHEWABLE_TABLET | ORAL | Status: AC

## 2023-08-01 MED ORDER — SODIUM CHLORIDE 0.9 % WEIGHT BASED INFUSION: 1.0000 mL/kg/h | INTRAVENOUS | Status: DC

## 2023-08-01 MED ORDER — LABETALOL HCL 5 MG/ML IV SOLN: 10.0000 mg | INTRAVENOUS | Status: DC | PRN

## 2023-08-01 MED ORDER — SODIUM CHLORIDE 0.9 % IV SOLN: INTRAVENOUS | Status: AC

## 2023-08-01 MED ORDER — HEPARIN SODIUM (PORCINE) 1000 UNIT/ML IJ SOLN
INTRAMUSCULAR | Status: DC | PRN
  Administered 2023-08-01: 4500 [IU] via INTRAVENOUS

## 2023-08-01 MED ORDER — ACETAMINOPHEN 325 MG PO TABS: 650.0000 mg | ORAL_TABLET | ORAL | Status: DC | PRN

## 2023-08-01 MED ORDER — SODIUM CHLORIDE 0.9% FLUSH: 3.0000 mL | INTRAVENOUS | Status: DC | PRN

## 2023-08-01 MED ORDER — FENTANYL CITRATE (PF) 100 MCG/2ML IJ SOLN
INTRAMUSCULAR | Status: DC | PRN
  Administered 2023-08-01: 25 ug via INTRAVENOUS

## 2023-08-01 MED ORDER — LIDOCAINE HCL (PF) 1 % IJ SOLN
INTRAMUSCULAR | Status: AC
  Filled 2023-08-01: qty 30

## 2023-08-01 MED ORDER — VERAPAMIL HCL 2.5 MG/ML IV SOLN
INTRAVENOUS | Status: AC
  Filled 2023-08-01: qty 2

## 2023-08-01 MED ORDER — ONDANSETRON HCL 4 MG/2ML IJ SOLN: 4.0000 mg | Freq: Four times a day (QID) | INTRAMUSCULAR | Status: DC | PRN

## 2023-08-01 MED ORDER — IOHEXOL 350 MG/ML SOLN
INTRAVENOUS | Status: DC | PRN
  Administered 2023-08-01: 40 mL

## 2023-08-01 MED ORDER — SODIUM CHLORIDE 0.9 % IV SOLN: 250.0000 mL | INTRAVENOUS | Status: DC | PRN

## 2023-08-01 MED ORDER — LIDOCAINE HCL (PF) 1 % IJ SOLN
INTRAMUSCULAR | Status: DC | PRN
  Administered 2023-08-01: 2 mL

## 2023-08-01 MED ORDER — FENTANYL CITRATE (PF) 100 MCG/2ML IJ SOLN
INTRAMUSCULAR | Status: AC
  Filled 2023-08-01: qty 2

## 2023-08-01 MED ORDER — VERAPAMIL HCL 2.5 MG/ML IV SOLN
INTRAVENOUS | Status: DC | PRN
  Administered 2023-08-01: 10 mL via INTRA_ARTERIAL

## 2023-08-01 MED ORDER — SODIUM CHLORIDE 0.9% FLUSH: 3.0000 mL | Freq: Two times a day (BID) | INTRAVENOUS | Status: DC

## 2023-08-01 MED ORDER — MIDAZOLAM HCL 2 MG/2ML IJ SOLN
INTRAMUSCULAR | Status: AC
  Filled 2023-08-01: qty 2

## 2023-08-01 MED ORDER — MIDAZOLAM HCL 2 MG/2ML IJ SOLN
INTRAMUSCULAR | Status: DC | PRN
  Administered 2023-08-01: 1 mg via INTRAVENOUS

## 2023-08-01 MED ORDER — HYDRALAZINE HCL 20 MG/ML IJ SOLN: 10.0000 mg | INTRAMUSCULAR | Status: DC | PRN

## 2023-08-01 MED ORDER — SODIUM CHLORIDE 0.9 % WEIGHT BASED INFUSION
3.0000 mL/kg/h | INTRAVENOUS | Status: AC
  Administered 2023-08-01: 3 mL/kg/h via INTRAVENOUS

## 2023-08-01 SURGICAL SUPPLY — 8 items
CATH 5FR JL3.5 JR4 ANG PIG MP (CATHETERS) IMPLANT
COVER DOME SNAP 22 D (MISCELLANEOUS) IMPLANT
DEVICE RAD COMP TR BAND LRG (VASCULAR PRODUCTS) IMPLANT
GLIDESHEATH SLEND SS 6F .021 (SHEATH) IMPLANT
GUIDEWIRE INQWIRE 1.5J.035X260 (WIRE) IMPLANT
INQWIRE 1.5J .035X260CM (WIRE) ×1
PACK CARDIAC CATHETERIZATION (CUSTOM PROCEDURE TRAY) ×1 IMPLANT
SET ATX-X65L (MISCELLANEOUS) IMPLANT

## 2023-08-01 NOTE — Discharge Instructions (Signed)

## 2023-08-01 NOTE — Interval H&P Note (Signed)
History and Physical Interval Note:  08/01/2023 8:56 AM  [name]  has presented today for surgery, with the diagnosis of unstable angina.  The various methods of treatment have been discussed with the patient and family. After consideration of risks, benefits and other options for treatment, the patient has consented to  Procedure(s): LEFT HEART CATH AND CORONARY ANGIOGRAPHY (N/A) as a surgical intervention.  The patient's history has been reviewed, patient examined, no change in status, stable for surgery.  I have reviewed the patient's chart and labs.  Questions were answered to the patient's satisfaction.    Cath Lab Visit (complete for each Cath Lab visit)  Clinical Evaluation Leading to the Procedure:   ACS: No.  Non-ACS:    Anginal Classification: CCS III  Anti-ischemic medical therapy: Minimal Therapy (1 class of medications)  Non-Invasive Test Results: High-risk stress test findings: cardiac mortality >3%/year (Cardiac CTA with possible severe LAD stenosis)  Prior CABG: No previous CABG        Verne Carrow

## 2023-08-02 ENCOUNTER — Telehealth: Payer: Self-pay | Admitting: Family Medicine

## 2023-08-02 ENCOUNTER — Encounter (HOSPITAL_COMMUNITY): Payer: Self-pay | Admitting: Cardiovascular Disease

## 2023-08-02 ENCOUNTER — Telehealth: Payer: Self-pay | Admitting: *Deleted

## 2023-08-02 DIAGNOSIS — E785 Hyperlipidemia, unspecified: Secondary | ICD-10-CM

## 2023-08-02 NOTE — Telephone Encounter (Signed)
You may order lipid panel under hyperlipidemia unspecified

## 2023-08-02 NOTE — Telephone Encounter (Signed)
Patient states she is set to meet with cardiology pharmacist on [address] on 8/19. States she would like to have her cholesterol levels checked prior to this visit. She clarified that if her cholesterol levels are good then she wouldn't want to keep the appointment. I informed patient I would ask PCP if labs can be ordered per her request but also recommended she call Dr. Jens Som, cardiology, and ask him if this could be done there. Please Advise.

## 2023-08-02 NOTE — Telephone Encounter (Signed)
See below

## 2023-08-02 NOTE — Telephone Encounter (Signed)
-----   Message from Verne Carrow sent at 08/01/2023 10:07 AM EDT ----- Arlys John, She has moderate disease in the ostium of non-dominant Circumflex and moderate disease in the proximal LAD. I do not think either lesion is flow limiting. I would favor a trial of Imdur and see if this helps her. Thayer Ohm

## 2023-08-02 NOTE — Telephone Encounter (Signed)
Left message for pt to call, per dr Jens Som, patient is to start isosorbide 60 mg once daily.

## 2023-08-03 NOTE — Telephone Encounter (Signed)
Lab ordered, ok to schedule lab visit.

## 2023-08-03 NOTE — Telephone Encounter (Signed)
Lab appointment scheduled for 08/04/23

## 2023-08-04 ENCOUNTER — Ambulatory Visit (HOSPITAL_COMMUNITY)
Admission: RE | Admit: 2023-08-04 | Discharge: 2023-08-04 | Disposition: A | Discharge status: Home / Self Care | Payer: Medicare HMO | Source: Ambulatory Visit | Attending: Physician Assistant | Admitting: Physician Assistant

## 2023-08-04 ENCOUNTER — Other Ambulatory Visit: Payer: Medicare HMO

## 2023-08-04 DIAGNOSIS — I10 Essential (primary) hypertension: Secondary | ICD-10-CM | POA: Diagnosis not present

## 2023-08-04 DIAGNOSIS — E785 Hyperlipidemia, unspecified: Secondary | ICD-10-CM

## 2023-08-04 DIAGNOSIS — I779 Disorder of arteries and arterioles, unspecified: Secondary | ICD-10-CM

## 2023-08-04 DIAGNOSIS — Z87891 Personal history of nicotine dependence: Secondary | ICD-10-CM | POA: Diagnosis not present

## 2023-08-04 DIAGNOSIS — I2510 Atherosclerotic heart disease of native coronary artery without angina pectoris: Secondary | ICD-10-CM | POA: Diagnosis not present

## 2023-08-04 LAB — LIPID PANEL
Cholesterol: 160 mg/dL (ref 0–200)
HDL: 56.7 mg/dL (ref >39.00)
LDL Cholesterol: 72 mg/dL (ref 0–99)
NonHDL: 103.74
Total CHOL/HDL Ratio: 3
Triglycerides: 157 mg/dL — ABNORMAL HIGH (ref 0.0–149.0)
VLDL: 31.4 mg/dL (ref 0.0–40.0)

## 2023-08-10 ENCOUNTER — Other Ambulatory Visit: Payer: Medicare HMO

## 2023-08-10 ENCOUNTER — Inpatient Hospital Stay: Payer: Medicare HMO | Attending: Nurse Practitioner | Admitting: Nurse Practitioner

## 2023-08-10 ENCOUNTER — Encounter: Payer: Self-pay | Admitting: Nurse Practitioner

## 2023-08-10 ENCOUNTER — Other Ambulatory Visit: Payer: Self-pay

## 2023-08-10 ENCOUNTER — Inpatient Hospital Stay: Payer: Medicare HMO

## 2023-08-10 VITALS — BP 147/73 | HR 88 | Temp 97.9°F | Resp 16 | Wt 188.1 lb

## 2023-08-10 DIAGNOSIS — D509 Iron deficiency anemia, unspecified: Secondary | ICD-10-CM

## 2023-08-10 DIAGNOSIS — I119 Hypertensive heart disease without heart failure: Secondary | ICD-10-CM | POA: Diagnosis not present

## 2023-08-10 DIAGNOSIS — Z809 Family history of malignant neoplasm, unspecified: Secondary | ICD-10-CM | POA: Diagnosis not present

## 2023-08-10 DIAGNOSIS — Z79899 Other long term (current) drug therapy: Secondary | ICD-10-CM | POA: Diagnosis not present

## 2023-08-10 DIAGNOSIS — M069 Rheumatoid arthritis, unspecified: Secondary | ICD-10-CM

## 2023-08-10 DIAGNOSIS — I2510 Atherosclerotic heart disease of native coronary artery without angina pectoris: Secondary | ICD-10-CM

## 2023-08-10 DIAGNOSIS — Z8041 Family history of malignant neoplasm of ovary: Secondary | ICD-10-CM

## 2023-08-10 DIAGNOSIS — Z87891 Personal history of nicotine dependence: Secondary | ICD-10-CM

## 2023-08-10 DIAGNOSIS — Z800 Family history of malignant neoplasm of digestive organs: Secondary | ICD-10-CM

## 2023-08-10 DIAGNOSIS — F32A Depression, unspecified: Secondary | ICD-10-CM

## 2023-08-10 DIAGNOSIS — D508 Other iron deficiency anemias: Secondary | ICD-10-CM

## 2023-08-10 MED ORDER — FERROUS SULFATE 325 (65 FE) MG PO TBEC: 325.0000 mg | DELAYED_RELEASE_TABLET | Freq: Every day | ORAL | 0 refills | Status: DC

## 2023-08-10 NOTE — Progress Notes (Addendum)
[facility]   [phone] [phone]   Clinic New consult Note   Patient Care Team: Shelva Majestic, MD as PCP - General (Family Medicine) Jens Som Madolyn Frieze, MD as PCP - Cardiology (Cardiology) Jens Som Madolyn Frieze, MD as Consulting Physician (Cardiology) Kelsey Savoy, MD as Consulting Physician (Rheumatology) Maris Berger, MD as Consulting Physician (Ophthalmology) Glyn Ade, PA-C as Physician Assistant (Dermatology) 08/10/2023  CHIEF COMPLAINTS/PURPOSE OF CONSULTATION:  Iron deficiency anemia, referred by GI Hyacinth Meeker, PA  HISTORY OF PRESENTING ILLNESS:  [name] 82 y.o. female with PMH including CAD, GERD, HTN, HL, OA, glaucoma, depression, and RF+  is here because of anemia.  Seen by GI on 07/11/2023 for dysphagia.  Last colonoscopy 06/08/2021 with several polyps and few colonic angioectasias and diverticulosis as well as nonbleeding hemorrhoids.  EGD same day with a 4 cm hiatal hernia and gastritis.  She was treated with PPI x 3 months.  FOBT+ on 06/08/2023.  She had normal CBC in 05/14/22. With mild anemia hgb 11.6 on 06/01/2023, and down to 10.7 on 06/22/2023. On 07/11/2023 iron panel showed serum iron 42, TIBC 429, 9% saturation, and a ferritin of 5.8. B12 was normal (687). She started B12 supplement 2 months ago but not oral iron.  Repeat lab 07/29/2023 showed Hgb 11.8.   Socially, she is widowed, 2 living daughters who are healthy, 1 daughter deceased age 59 who had ovarian cancer but passed away from complications from flu and pneumonia.  Patient independent with ADLs and drives.  Exercises 2 days/week at the [facility].  Denies alcohol, tobacco, or other drug use.  She is up-to-date on age-appropriate cancer screenings.  Today she presents by herself.  Feeling tired but active with rest.  Notes occasional palpitations.  She has occasional blood with wiping after BMs, known hemorrhoids and some straining.  Denies other bleeding  such as hematuria, vaginal bleeding, or epistaxis.  Takes baby aspirin, no other NSAIDs.  She has had dysphagia for about 3 to 4 months which is being worked up.  Denies significant weight loss, pain, fever, chills, cough, chest pain, dyspnea.    MEDICAL HISTORY:  Past Medical History:  Diagnosis Date   Anginal pain (HCC) 08/29/2012   "have had problems w/this"   Anxiety 08/29/2012   Arthritis 08/29/2012   right hand, Seen by Dr. Corliss Skains   CAD (coronary artery disease)    nonobstructive   Cataract 03/30/07   10/16/09   Depression    Diabetes mellitus without complication (HCC)    Exertional dyspnea 08/29/2012   GERD (gastroesophageal reflux disease)    Hammer toe    surgery x2, second with Dr. Lajoyce Corners- only other option amputatoin   HLD (hyperlipidemia)    HTN (hypertension)    PNEUMONIA 07/04/2007   Qualifier: Diagnosis of  By: Drue Novel MD, Nolon Rod.    Syncope and collapse 08/29/2012   "first time ever"    SURGICAL HISTORY: Past Surgical History:  Procedure Laterality Date   ABDOMINAL HYSTERECTOMY  08/27/1969   APPENDECTOMY  ~ 1970   CATARACT EXTRACTION W/ INTRAOCULAR LENS  IMPLANT, BILATERAL  ~ 2010   bilateral   EYE SURGERY  03/30/07   10/16/09   LEFT HEART CATH AND CORONARY ANGIOGRAPHY N/A 08/01/2023   Procedure: LEFT HEART CATH AND CORONARY ANGIOGRAPHY;  Surgeon: Kathleene Hazel, MD;  Location: [facility];  Service: Cardiovascular;  Laterality: N/A;   TONSILLECTOMY AND ADENOIDECTOMY  08/27/1949   VEIN LIGATION AND STRIPPING  12/27/2010  bilaterally    SOCIAL HISTORY: Social History   Socioeconomic History   Marital status: Widowed    Spouse name: Not on file   Number of children: 3   Years of education: Not on file   Highest education level: Associate degree: occupational, Scientist, product/process development, or vocational program  Occupational History   Occupation: retired  Tobacco Use   Smoking status: Former    Current packs/day: 0.00    Average packs/day: 1 pack/day  for 20.0 years (20.0 ttl pk-yrs)    Types: Cigarettes    Start date: 12/27/1968    Quit date: 12/27/1988    Years since quitting: 34.6    Passive exposure: Never   Smokeless tobacco: Never   Tobacco comments:    teenager  Vaping Use   Vaping status: Never Used  Substance and Sexual Activity   Alcohol use: No   Drug use: No   Sexual activity: Not Currently  Other Topics Concern   Not on file  Social History Narrative   Widowed 2013/10/18. Daughter died 18-Aug-2012. 2 living children. 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      Hobbies: gardening, time with dog, time with grandkids   Social Determinants of Health   Financial Resource Strain: Low Risk  (07/09/2023)   Overall Financial Resource Strain (CARDIA)    Difficulty of Paying Living Expenses: Not hard at all  Food Insecurity: No Food Insecurity (07/09/2023)   Hunger Vital Sign    Worried About Running Out of Food in the Last Year: Never true    Ran Out of Food in the Last Year: Never true  Transportation Needs: No Transportation Needs (07/09/2023)   PRAPARE - Administrator, Civil Service (Medical): No    Lack of Transportation (Non-Medical): No  Physical Activity: Sufficiently Active (07/09/2023)   Exercise Vital Sign    Days of Exercise per Week: 4 days    Minutes of Exercise per Session: 110 min  Stress: Stress Concern Present (07/09/2023)   [employer] of Occupational Health - Occupational Stress Questionnaire    Feeling of Stress : Very much  Social Connections: Moderately Isolated (07/09/2023)   Social Connection and Isolation Panel [NHANES]    Frequency of Communication with Friends and Family: More than three times a week    Frequency of Social Gatherings with Friends and Family: More than three times a week    Attends Religious Services: 1 to 4 times per year    Active  Member of [employer] or Organizations: No    Attends Banker Meetings: Not on file    Marital Status: Widowed  Catering manager Violence: Not on file    FAMILY HISTORY: Family History  Problem Relation Age of Onset   Heart disease Mother        brother, father   Diabetes Mother        sister, brother   Vision loss Mother    Suicidality Father        78   Depression Father    Heart disease Father    Vision loss Father    Coronary artery disease Brother    Colon cancer Brother    Stroke Brother    ADD / ADHD Brother    Anxiety disorder Brother    Cancer Brother  Depression Brother    Diabetes Brother    Alcoholism Brother    Alcohol abuse Brother    Depression Brother    Diabetes Brother    Vision loss Brother    Ovarian cancer Daughter    Arthritis Sister    Diabetes Sister    Hearing loss Sister    Diabetes Sister    Esophageal cancer Neg Hx    Rectal cancer Neg Hx    Stomach cancer Neg Hx    Liver cancer Neg Hx     ALLERGIES:  is allergic to clindamycin/lincomycin and trazodone and nefazodone.  MEDICATIONS:  Current Outpatient Medications  Medication Sig Dispense Refill   amLODipine (NORVASC) 10 MG tablet Take 1 tablet (10 mg total) by mouth daily. 90 tablet 3   aspirin 81 MG tablet Take 81 mg by mouth daily.      atorvastatin (LIPITOR) 80 MG tablet Take 1 tablet (80 mg total) by mouth every evening. 90 tablet 3   Cholecalciferol (VITAMIN D3) 125 MCG (5000 UT) CAPS Take 5,000 Units by mouth daily.     diclofenac sodium (VOLTAREN) 1 % GEL Apply 3 gm to 3 large joints up to 3 times a day.Dispense 3 tubes with 3 refills. (Patient taking differently: Apply 3 g topically 3 (three) times daily as needed (joint pain). Apply 3 gm to 3 large joints up to 3 times a day.Dispense 3 tubes with 3 refills.) 3 Tube 3   escitalopram (LEXAPRO) 20 MG tablet Take 1 tablet (20 mg total) by mouth daily. 90 tablet 3   ezetimibe (ZETIA) 10 MG tablet Take 1 tablet (10 mg  total) by mouth daily. 90 tablet 3   ferrous sulfate 325 (65 FE) MG EC tablet Take 1 tablet (325 mg total) by mouth daily with breakfast. 90 tablet 0   nitroGLYCERIN (NITROSTAT) 0.4 MG SL tablet Place 1 tablet (0.4 mg total) under the tongue every 5 (five) minutes as needed for chest pain. 25 tablet 3   omeprazole (PRILOSEC) 40 MG capsule Take 1 capsule (40 mg total) by mouth 2 (two) times daily. 60 capsule 5   triamcinolone cream (KENALOG) 0.1 % Apply 1 Application topically 2 (two) times daily. For 7-10 days maximum (Patient taking differently: Apply 1 Application topically 2 (two) times daily as needed (irritation). For 7-10 days maximum) 80 g 0   vitamin B-12 (CYANOCOBALAMIN) 1000 MCG tablet Take 1,000 mcg by mouth daily.     No current facility-administered medications for this visit.    REVIEW OF SYSTEMS:   Constitutional: Denies fevers, chills or abnormal night sweats (+) fatigue Eyes: Denies blurriness of vision, double vision or watery eyes Ears, nose, mouth, throat, and face: Denies mucositis or sore throat Respiratory: Denies cough, dyspnea or wheezes Cardiovascular: Denies chest discomfort or lower extremity swelling (+) palpitations Gastrointestinal:  Denies nausea, heartburn or change in bowel habits (+) hemorrhoids (+) dysphagia Skin: Denies abnormal skin rashes Lymphatics: Denies new lymphadenopathy or easy bruising Neurological:Denies numbness, tingling or new weaknesses Behavioral/Psych: Mood is stable, no new changes (+) depression, controlled  All other systems were reviewed with the patient and are negative.  PHYSICAL EXAMINATION: ECOG PERFORMANCE STATUS: 1 - Symptomatic but completely ambulatory  Vitals:   08/10/23 1156  BP: (!) 147/73  Pulse: 88  Resp: 16  Temp: 97.9 F (36.6 C)  SpO2: 100%   Filed Weights   08/10/23 1156  Weight: 188 lb 1.6 oz (85.3 kg)    GENERAL:alert, no distress and comfortable SKIN: no rashes  or significant lesions EYES: sclera  clear NECK: without mass LYMPH:  no palpable cervical or supraclavicular lymphadenopathy  LUNGS: clear with normal breathing effort HEART: regular rate & rhythm, no lower extremity edema ABDOMEN:abdomen soft, non-tender and normal bowel sounds Musculoskeletal:no cyanosis of digits and no clubbing  PSYCH: alert & oriented x 3 with fluent speech NEURO: no focal motor/sensory deficits  LABORATORY DATA:  I have reviewed the data as listed    Latest Ref Rng & Units 07/29/2023    8:06 AM 07/11/2023    9:32 AM 07/05/2023   10:31 AM  CBC  WBC 3.4 - 10.8 x10E3/uL 6.6  5.3  5.9   Hemoglobin 11.1 - 15.9 g/dL 44.0  34.7  42.5   Hematocrit 34.0 - 46.6 % 35.2  33.2  33.9   Platelets 150 - 450 x10E3/uL 314  280.0  280.0        Latest Ref Rng & Units 07/29/2023    8:06 AM 06/01/2023   11:33 AM 05/14/2022    8:17 AM  CMP  Glucose 70 - 99 mg/dL 956  90  98   BUN 8 - 27 mg/dL 14  17  22    Creatinine 0.57 - 1.00 mg/dL 3.87  5.64  3.32   Sodium 134 - 144 mmol/L 141  140  141   Potassium 3.5 - 5.2 mmol/L 4.2  4.2  4.1   Chloride 96 - 106 mmol/L 105  104  104   CO2 20 - 29 mmol/L 26  23  29    Calcium 8.7 - 10.3 mg/dL 9.5  9.3  9.5   Total Protein 6.0 - 8.3 g/dL  6.8  6.8   Total Bilirubin 0.2 - 1.2 mg/dL  0.8  0.7   Alkaline Phos 39 - 117 U/L  70  66   AST 0 - 37 U/L  22  24   ALT 0 - 35 U/L  13  19      RADIOGRAPHIC STUDIES: I have personally reviewed the radiological images as listed and agreed with the findings in the report. VAS US CAROTID  Result Date: 08/04/2023 Carotid Arterial Duplex Study Patient Name:  CASSITY DEPOLO  Date of Exam:   08/04/2023 Medical Rec #: [id number]           Accession #:    [id number] Date of Birth: 30-Nov-1941           Patient Gender: F Patient Age:   35 years Exam Location:  [facility] Procedure:      VAS US CAROTID Referring Phys: HAO MENG --------------------------------------------------------------------------------  Indications:       Carotid artery disease.                     Patient denies any cerebrovascular symptoms. Risk Factors:      Hypertension, hyperlipidemia, past history of smoking,                    coronary artery disease. Comparison Study:  Previous carotid duplex performed 07/14/22 showed RICA                    velocities of 65/18 cm/sec and LICA velocities of 74/19                    cm/sec. Performing Technologist: Olegario Shearer RVT  Examination Guidelines: A complete evaluation includes B-mode imaging, spectral Doppler, color Doppler, and power Doppler as needed of all accessible portions of each vessel.  Bilateral testing is considered an integral part of a complete examination. Limited examinations for reoccurring indications may be performed as noted.  Right Carotid Findings: +----------+--------+--------+--------+------------------+--------+           PSV cm/sEDV cm/sStenosisPlaque DescriptionComments +----------+--------+--------+--------+------------------+--------+ CCA Prox  44      6                                          +----------+--------+--------+--------+------------------+--------+ CCA Distal55      9                                          +----------+--------+--------+--------+------------------+--------+ ICA Prox  66      13                                         +----------+--------+--------+--------+------------------+--------+ ICA Mid   94      22      1-39%   heterogenous               +----------+--------+--------+--------+------------------+--------+ ICA Distal75      15                                         +----------+--------+--------+--------+------------------+--------+ ECA       166     8                                          +----------+--------+--------+--------+------------------+--------+ +----------+--------+-------+---------+-------------------+           PSV cm/sEDV cmsDescribe Arm Pressure (mmHG)  +----------+--------+-------+---------+-------------------+ Subclavian219            Turbulent144                 +----------+--------+-------+---------+-------------------+ +---------+--------+--+--------+-+---------+ VertebralPSV cm/s30EDV cm/s6Antegrade +---------+--------+--+--------+-+---------+  Left Carotid Findings: +----------+--------+--------+--------+------------------+--------+           PSV cm/sEDV cm/sStenosisPlaque DescriptionComments +----------+--------+--------+--------+------------------+--------+ CCA Prox  48      5                                          +----------+--------+--------+--------+------------------+--------+ CCA Distal64      14                                         +----------+--------+--------+--------+------------------+--------+ ICA Prox  85      17              heterogenous               +----------+--------+--------+--------+------------------+--------+ ICA Mid   91      21      1-39%                              +----------+--------+--------+--------+------------------+--------+ ICA Distal39  11                                         +----------+--------+--------+--------+------------------+--------+ ECA       95      12                                         +----------+--------+--------+--------+------------------+--------+ +----------+--------+--------+----------------+-------------------+           PSV cm/sEDV cm/sDescribe        Arm Pressure (mmHG) +----------+--------+--------+----------------+-------------------+ [id number]             Multiphasic, [id number]                 +----------+--------+--------+----------------+-------------------+ +---------+--------+--+--------+-+---------+ VertebralPSV cm/s28EDV cm/s8Antegrade +---------+--------+--+--------+-+---------+   Summary: Right Carotid: Velocities in the right ICA are consistent with a 1-39% stenosis. Left Carotid:  Velocities in the left ICA are consistent with a 1-39% stenosis. Vertebrals:  Bilateral vertebral arteries demonstrate antegrade flow. Subclavians: Normal flow hemodynamics were seen in bilateral subclavian              arteries. *See table(s) above for measurements and observations.  Electronically signed by Dina Rich MD on 08/04/2023 at 5:52:37 PM.    Final    CARDIAC CATHETERIZATION  Result Date: 08/01/2023   Mid RCA lesion is 20% stenosed.   Ost Cx to Prox Cx lesion is 50% stenosed.   Mid LM to Dist LM lesion is 20% stenosed.   Ost LAD to Prox LAD lesion is 40% stenosed.   The left ventricular systolic function is normal.   LV end diastolic pressure is normal.   The left ventricular ejection fraction is 55-65% by visual estimate. Mild to Moderate non-obstructive proximal LAD stenosis Moderate stenosis in the ostium of the moderate caliber, non-dominant Circumflex artery. Mild stenosis in the mid segment of the large, dominant RCA LVEDP 26 mm Hg Recommendations: Medical management of CAD. Consider adding a long acting nitrate (Imdur) or Ranexa.   DG ESOPHAGUS W DOUBLE CM (HD)  Result Date: 07/26/2023 CLINICAL DATA:  dyshpagia EXAM: ESOPHOGRAM / BARIUM SWALLOW / BARIUM TABLET STUDY TECHNIQUE: Combined double contrast and single contrast examination performed using effervescent crystals, thick barium liquid, and thin barium liquid. The patient was observed with fluoroscopy swallowing a 13 mm barium sulphate tablet. FLUOROSCOPY: Radiation Exposure Index (as provided by the fluoroscopic device): 37.8 mGy Kerma COMPARISON:  None Available. FINDINGS: Scout: Visualized bilateral lungs and bilateral lateral costophrenic angles are clear. Mild cardiomegaly. No free air under the domes of diaphragm. Granular appearance of the esophagus on the double contrast views is compatible with mild reflux esophagitis. There is esophageal dysmotility with interrupted primary peristaltic waves. Multiple non propulsive  tertiary contractions are noted in the mid/lower esophagus. There is significant delay of emptying the esophageal contents into the stomach. There is free gastroesophageal reflux. No esophageal stricture, ring or web. There is a small sized sliding type hiatal hernia. IMPRESSION: 1. Esophageal dysmotility. 2. Small sized sliding type hiatal hernia. 3. Free gastroesophageal reflux and resultant reflux esophagitis. Electronically Signed   By: Jules Schick M.D.   On: 07/26/2023 10:03   CT CORONARY MORPH W/CTA COR W/SCORE W/CA W/CM &/OR WO/CM  Addendum Date: 07/19/2023   ADDENDUM REPORT: 07/19/2023 18:16 EXAM: OVER-READ INTERPRETATION  CT CHEST The following report is  an over-read performed by radiologist Dr. Joen Laura [facility] on 07/19/2023. This over-read does not include interpretation of cardiac or coronary anatomy or pathology. The coronary CTA interpretation by the cardiologist is attached. COMPARISON:  02/14/2005, 06/01/2023 FINDINGS: Mediastinum: Trachea and esophagus are grossly unremarkable. Small hiatal hernia. No pathologic adenopathy. Lungs: No acute pleural or parenchymal lung disease. Upper abdomen: No acute findings. Musculoskeletal: No acute or destructive bony abnormalities. Reconstructed images demonstrate no additional findings. IMPRESSION: 1. Small hiatal hernia. 2. No other acute or significant noncardiac findings. Electronically Signed   By: Sharlet Salina M.D.   On: 07/19/2023 18:16   Result Date: 07/19/2023 CLINICAL DATA:  82 yo female with chest pain EXAM: Cardiac/Coronary CTA TECHNIQUE: A non-contrast, gated CT scan was obtained with axial slices of 3 mm through the heart for calcium scoring. Calcium scoring was performed using the Agatston method. A 120 kV prospective, gated, contrast cardiac scan was obtained. Gantry rotation speed was 250 msecs and collimation was 0.6 mm. Two sublingual nitroglycerin tablets (0.8 mg) were given. The 3D data set was reconstructed  in 5% intervals of the 35-75% of the R-R cycle. Diastolic phases were analyzed on a dedicated workstation using MPR, MIP, and VRT modes. The patient received 95 cc of contrast. FINDINGS: Image quality: Average. Noise artifact is: Limited Coronary Arteries:  Normal coronary origin.  Right dominance. Left main: The left main is a large caliber vessel with a normal take off from the left coronary cusp that bifurcates to form a left anterior descending artery and a left circumflex artery. There is mild (25-49) calcified plaque in the distal vessel extending into the Lcx and LAD. Left anterior descending artery: The LAD has mild (25-49) calcified plaque in the proximal vessel followed by moderate (50-69) mixed plaque stenosis. The LAD gives off 1 diagonal branch; there is mild (25-49) calcified plaque followed by moderate (50-69) soft plaque. Left circumflex artery: The LCX is non-dominant and patent with minimal (0-24) plaque in the proximal vessel. The LCX gives off 2 patent obtuse marginal branches. Right coronary artery: The RCA is dominant with normal take off from the right coronary cusp. There is minimal (0-24) plaque in the proximal vessel followed by mild (25-49) calcified plaque. The RCA terminates as a PDA and right posterolateral; there is mild (25-49) calcified plaque at the ostium of the PDA and in the mid vessel; there is mild (25-49) calcified plaque in the posterolateral. Right Atrium: Right atrial size is within normal limits. Right Ventricle: The right ventricular cavity is within normal limits. Left Atrium: Left atrial size is normal in size with no left atrial appendage filling defect. Left Ventricle: The ventricular cavity size is within normal limits. There are no stigmata of prior infarction. There is no abnormal filling defect. Pulmonary arteries: Dilated (34 mm) without proximal filling defect. Pulmonary veins: Normal pulmonary venous drainage. Pericardium: Normal thickness with no significant  effusion or calcium present. Cardiac valves: The aortic valve is trileaflet without significant calcification. The mitral valve is normal structure without significant calcification. Aorta: Normal caliber with aortic atherosclerosis. Extra-cardiac findings: See attached radiology report for non-cardiac structures. IMPRESSION: 1. Coronary calcium score of 654. This was 4 percentile for age-, sex, and race-matched controls. 2. Normal coronary origin with right dominance. 3. Moderate (50-60) stenoses in mid to distal LAD and D1; mild (25-49) stenoses in the distal LM, LAD, RCA, PDA and posterolateral. 4. Aortic atherosclerosis. 5. Dilated pulmonary artery (34 mm) suggestive of pulmonary hypertension. 6. Study will be  sent for FFR. RECOMMENDATIONS: CAD-RADS 3: Moderate stenosis. Consider symptom-guided anti-ischemic pharmacotherapy as well as risk factor modification per guideline directed care. Additional analysis with CT FFR will be submitted. Olga Millers, MD Electronically Signed: By: Olga Millers M.D. On: 07/12/2023 11:07   CT CORONARY FFR DATA PREP & FLUID ANALYSIS  Result Date: 07/12/2023 EXAM: FFRCT ANALYSIS FINDINGS: FFRct analysis was performed on the original cardiac CT angiogram dataset. Diagrammatic representation of the FFRct analysis is provided in a separate PDF document in PACS. This dictation was created using the PDF document and an interactive 3D model of the results. 3D model is not available in the EMR/PACS. Normal FFR range is >0.80. 1. Left Main: findings 2. LAD: findings 0.71 3. LCX: findings 0.88, 0.86 4.      RCA: findings 0.90 IMPRESSION: FFR abnormal in the distal LAD suggestive of flow limiting lesion in the mid to distal vessel. Note: These examples are not recommendations of [employer] and only provided as examples of what other customers are doing. Electronically Signed   By: Olga Millers M.D.   On: 07/12/2023 11:09    ASSESSMENT & PLAN: 82 year old female with    Iron  deficiency anemia -We reviewed her medical record in detail with the patient.   -Previous EGD/colonoscopy showed 4 cm hiatal hernia with gastritis, nonbleeding hemorrhoids, diverticulosis, and a few colonic angioectasias (AVMs) -She presented with dysphagia, fatigue, and palpitations. Undergoing cardiac and GI work up [employer] showed Hgb 10.7 in 05/2023, more recently 11.8 on 8/2, and isolated iron deficiency with iron panel showing serum iron 42, TIBC 429, 9% saturation, and a ferritin of 5.8. B12 normal -She has no obvious bleeding. 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  -Patient seen with Dr. Mosetta Putt who recommends oral iron for at least 3 months, then repeat CBC/iron studies.  -I prescribed ferrous sulfate 1 tab daily, take with vit C, and separate from PPI by at least 2 hours to promote absorption -If she does not tolerate ferrous sulfate, can try slow-FE or other formulation, or prenatal vitamin -If IDA does not resolve after 3-6 months of oral iron, or she does not tolerate oral iron, we can see her back to consider IV iron -Will ask GI or PCP to follow, will see her back if needed in the future   PLAN: -Medical record reviewed  -Begin oral iron (ferrous sulfate) p.o. once daily, take with vit C, and separate from PPI by at least 2 hours to promote absorption -If she does not tolerate ferrous sulfate, can try slow-FE or other formulation, or prenatal vitamin -CBC/iron studies after 3 months oral iron -If IDA does not resolve after 3-6 months of oral iron, or she does not tolerate oral iron, we can see her back to consider IV iron -Will ask GI or PCP to follow, I CC'd my note -Will see her back if needed in the future   All questions were answered. The patient knows to call the clinic with any problems, questions or concerns.      Kelsey Samples, NP 08/10/23   Addendum I have seen the patient, examined her. I agree with the assessment and  and plan and have edited the notes.   Pt is a 82 yo female with PMH of CAD, GERD, HTN, HL, OA, glaucoma, depression, and rheumatoid arthritis, was referred for iron deficient anemia.  I reviewed her outside lab, which confirms mild iron deficient anemia.  She is on oral  iron, overall tolerating well, her anemia has resolved.  She has had a GI workup.  I encouraged her to continue oral iron, no need IV iron for now.  We will see her as needed in future.  I spent a total of 30 minutes for her visit today, and >50% on face to face counseling.   Malachy Mood MD 08/10/2023

## 2023-08-15 ENCOUNTER — Ambulatory Visit: Payer: Medicare HMO | Admitting: Pharmacist Clinician (PhC)/ Clinical Pharmacy Specialist

## 2023-08-15 NOTE — Progress Notes (Signed)
This encounter was created in error - please disregard.

## 2023-08-16 ENCOUNTER — Ambulatory Visit: Payer: Medicare HMO | Admitting: Adult Health

## 2023-08-18 ENCOUNTER — Ambulatory Visit: Payer: Medicare HMO | Admitting: Nurse Practitioner

## 2023-09-05 DIAGNOSIS — Z85828 Personal history of other malignant neoplasm of skin: Secondary | ICD-10-CM | POA: Diagnosis not present

## 2023-09-05 DIAGNOSIS — C44729 Squamous cell carcinoma of skin of left lower limb, including hip: Secondary | ICD-10-CM | POA: Diagnosis not present

## 2023-09-05 DIAGNOSIS — D0461 Carcinoma in situ of skin of right upper limb, including shoulder: Secondary | ICD-10-CM | POA: Diagnosis not present

## 2023-09-06 ENCOUNTER — Encounter: Payer: Self-pay | Admitting: *Deleted

## 2023-09-06 NOTE — Telephone Encounter (Signed)
Letter mailed to patient asking her to call and discuss medication recommendations.

## 2023-09-12 ENCOUNTER — Telehealth: Payer: Self-pay | Admitting: Cardiology

## 2023-09-12 ENCOUNTER — Encounter: Payer: Self-pay | Admitting: Cardiology

## 2023-09-12 MED ORDER — ISOSORBIDE MONONITRATE ER 60 MG PO TB24: 60.0000 mg | ORAL_TABLET | Freq: Every day | ORAL | 3 refills | Status: DC

## 2023-09-12 NOTE — Telephone Encounter (Signed)
Spoke with pt, please see previous notes in chart from 08/02/23. Patient is aware to start isosorbide 60 mg once daily. She will start with 1/4 of tablet, then 1/2 tablet and then go to whole tablet once daily. She is aware she may get headache but should get better the longer she takes it. She will call with problems. Marland Kitchen

## 2023-09-12 NOTE — Telephone Encounter (Signed)
Patient returning call.

## 2023-09-12 NOTE — Telephone Encounter (Signed)
Patient returned call to RN Stanton Kidney and stated she will be available after 12:00 noon today.

## 2023-09-12 NOTE — Telephone Encounter (Signed)
Left message for pt to call.

## 2023-09-20 ENCOUNTER — Ambulatory Visit: Payer: Medicare HMO | Admitting: Physician Assistant

## 2023-09-20 ENCOUNTER — Encounter: Payer: Self-pay | Admitting: Physician Assistant

## 2023-09-20 VITALS — BP 110/50 | HR 64 | Ht 63.0 in | Wt 188.4 lb

## 2023-09-20 DIAGNOSIS — R1310 Dysphagia, unspecified: Secondary | ICD-10-CM

## 2023-09-20 DIAGNOSIS — K2100 Gastro-esophageal reflux disease with esophagitis, without bleeding: Secondary | ICD-10-CM

## 2023-09-20 DIAGNOSIS — D509 Iron deficiency anemia, unspecified: Secondary | ICD-10-CM

## 2023-09-20 DIAGNOSIS — K224 Dyskinesia of esophagus: Secondary | ICD-10-CM

## 2023-09-20 NOTE — Progress Notes (Signed)
Chief Complaint: Follow-up dysphagia and iron deficiency anemia  HPI:    [name] is an 82 year old female with a past medical history as listed below including CAD (03/04/2022 echo with normal LVEF 60-65%), GERD, hypertension and multiple others, known to Dr. Lavon Paganini, who returns to clinic today for follow-up of her dysphagia and iron deficiency anemia.      6 /13/22 colonoscopy with three 7-11 mm polyps in the transverse colon and cecum, few colonic angioectasias and diverticulosis in the sigmoid and descending colon as well as nonbleeding external and internal hemorrhoids.  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  Patient told to use Omeprazole 40 daily for 3 months.    06/08/2023 fecal occult positive.    07/05/2023 CBC with a hemoglobin of 11.3 (10.7 on 06/22/2023, 11.6 on 06/01/2023, 12.6 on 05/14/2022).  MCV normal.    07/11/2023 office visit with me that time discussed anemia.  Also being told there is blood in her stool.  Had some concerns about dysphagia for the past 6 to 8 months.  At that time discussed that she had a full EGD and colonoscopy in June 2022.  Did not need further workup for anemia.  Recommended repeat CBC and iron studies as well as B12 and folate.  Ordered a barium esophagram for dysphagia.  Also started Omeprazole 40 twice daily.    07/11/2023 iron studies with percent saturation low at 9.8, ferritin low at 5.8.  She was referred to hematology for iron infusions.  B12 was normal.    07/26/2023 esophagram with esophageal dysmotility and small sized sliding-type hiatal hernia as well as reflux and reflux esophagitis.  Patient referred to speech pathology but has not seen them yet.    07/29/2023 CBC with a hemoglobin normal at 11.8 (11 on 07/11/2023).    08/10/2023 patient seen in office by hematology oncology for iron deficiency anemia.  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     Today, patient presents to clinic and tells me that she is feeling well.  Her dysphagia symptoms have gotten better with taking the Omeprazole 40 mg twice a day.  She is not interested in following up with speech pathology at this time as things are better.    Also followed with hematology who seems to think she was improving at that point on oral iron and continued her on this.  They would like someone to recheck her iron studies in about 3 months, if things continue to trend up then she does not need to follow-up with them any further otherwise could consider iron infusions.  She has an appointment with her PCP in December.    Patient is going to [place] for her 57+-year-old brothers second wedding this weekend.    Denies fever, chills, weight loss, blood in her stool, nausea or vomiting.  Past Medical History:  Diagnosis Date   Anginal pain (HCC) 08/29/2012   "have had problems w/this"   Anxiety 08/29/2012   Arthritis 08/29/2012   right hand, Seen by Dr. Corliss Skains   CAD (coronary artery disease)    nonobstructive   Cataract 03/30/07   10/16/09   Depression    Diabetes mellitus without complication (HCC)    Exertional dyspnea 08/29/2012   GERD (gastroesophageal reflux disease)    Hammer toe    surgery x2, second with Dr. Lajoyce Corners- only other option amputatoin   HLD (hyperlipidemia)  HTN (hypertension)    PNEUMONIA 07/04/2007   Qualifier: Diagnosis of  By: Drue Novel MD, Nolon Rod.    Syncope and collapse 08/29/2012   "first time ever"    Past Surgical History:  Procedure Laterality Date   ABDOMINAL HYSTERECTOMY  08/27/1969   APPENDECTOMY  ~ 1970   CATARACT EXTRACTION W/ INTRAOCULAR LENS  IMPLANT, BILATERAL  ~ 2010   bilateral   EYE SURGERY  03/30/07   10/16/09   LEFT HEART CATH AND CORONARY ANGIOGRAPHY N/A 08/01/2023   Procedure: LEFT HEART CATH AND CORONARY ANGIOGRAPHY;  Surgeon: Kathleene Hazel, MD;  Location: [facility];  Service: Cardiovascular;  Laterality: N/A;    TONSILLECTOMY AND ADENOIDECTOMY  08/27/1949   VEIN LIGATION AND STRIPPING  12/27/2010   bilaterally    Current Outpatient Medications  Medication Sig Dispense Refill   amLODipine (NORVASC) 10 MG tablet Take 1 tablet (10 mg total) by mouth daily. 90 tablet 3   aspirin 81 MG tablet Take 81 mg by mouth daily.      atorvastatin (LIPITOR) 80 MG tablet Take 1 tablet (80 mg total) by mouth every evening. 90 tablet 3   Cholecalciferol (VITAMIN D3) 125 MCG (5000 UT) CAPS Take 5,000 Units by mouth daily.     diclofenac sodium (VOLTAREN) 1 % GEL Apply 3 gm to 3 large joints up to 3 times a day.Dispense 3 tubes with 3 refills. (Patient taking differently: Apply 3 g topically 3 (three) times daily as needed (joint pain). Apply 3 gm to 3 large joints up to 3 times a day.Dispense 3 tubes with 3 refills.) 3 Tube 3   escitalopram (LEXAPRO) 20 MG tablet Take 1 tablet (20 mg total) by mouth daily. 90 tablet 3   ezetimibe (ZETIA) 10 MG tablet Take 1 tablet (10 mg total) by mouth daily. 90 tablet 3   ferrous sulfate 325 (65 FE) MG EC tablet Take 1 tablet (325 mg total) by mouth daily with breakfast. 90 tablet 0   isosorbide mononitrate (IMDUR) 60 MG 24 hr tablet Take 1 tablet (60 mg total) by mouth daily. 90 tablet 3   nitroGLYCERIN (NITROSTAT) 0.4 MG SL tablet Place 1 tablet (0.4 mg total) under the tongue every 5 (five) minutes as needed for chest pain. 25 tablet 3   omeprazole (PRILOSEC) 40 MG capsule Take 1 capsule (40 mg total) by mouth 2 (two) times daily. 60 capsule 5   triamcinolone cream (KENALOG) 0.1 % Apply 1 Application topically 2 (two) times daily. For 7-10 days maximum (Patient taking differently: Apply 1 Application topically 2 (two) times daily as needed (irritation). For 7-10 days maximum) 80 g 0   vitamin B-12 (CYANOCOBALAMIN) 1000 MCG tablet Take 1,000 mcg by mouth daily.     No current facility-administered medications for this visit.    Allergies as of 09/20/2023 - Review Complete 08/10/2023   Allergen Reaction Noted   Clindamycin/lincomycin  12/01/2015   Trazodone and nefazodone Other (See Comments) 01/26/2017    Family History  Problem Relation Age of Onset   Heart disease Mother        brother, father   Diabetes Mother        sister, brother   Vision loss Mother    Suicidality Father        28   Depression Father    Heart disease Father    Vision loss Father    Coronary artery disease Brother    Colon cancer Brother    Stroke Brother  ADD / ADHD Brother    Anxiety disorder Brother    Cancer Brother    Depression Brother    Diabetes Brother    Alcoholism Brother    Alcohol abuse Brother    Depression Brother    Diabetes Brother    Vision loss Brother    Ovarian cancer Daughter    Arthritis Sister    Diabetes Sister    Hearing loss Sister    Diabetes Sister    Esophageal cancer Neg Hx    Rectal cancer Neg Hx    Stomach cancer Neg Hx    Liver cancer Neg Hx     Social History   Socioeconomic History   Marital status: Widowed    Spouse name: Not on file   Number of children: 3   Years of education: Not on file   Highest education level: Associate degree: occupational, Scientist, product/process development, or vocational program  Occupational History   Occupation: retired  Tobacco Use   Smoking status: Former    Current packs/day: 0.00    Average packs/day: 1 pack/day for 20.0 years (20.0 ttl pk-yrs)    Types: Cigarettes    Start date: 12/27/1968    Quit date: 12/27/1988    Years since quitting: 34.7    Passive exposure: Never   Smokeless tobacco: Never   Tobacco comments:    teenager  Vaping Use   Vaping status: Never Used  Substance and Sexual Activity   Alcohol use: No   Drug use: No   Sexual activity: Not Currently  Other Topics Concern   Not on file  Social History Narrative   Widowed Nov 17, 2013. Daughter died 10-18-12. 2 living children. 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      Hobbies: gardening, time with dog, time with grandkids   Social Determinants of Health   Financial Resource Strain: Low Risk  (07/09/2023)   Overall Financial Resource Strain (CARDIA)    Difficulty of Paying Living Expenses: Not hard at all  Food Insecurity: No Food Insecurity (07/09/2023)   Hunger Vital Sign    Worried About Running Out of Food in the Last Year: Never true    Ran Out of Food in the Last Year: Never true  Transportation Needs: No Transportation Needs (07/09/2023)   PRAPARE - Administrator, Civil Service (Medical): No    Lack of Transportation (Non-Medical): No  Physical Activity: Sufficiently Active (07/09/2023)   Exercise Vital Sign    Days of Exercise per Week: 4 days    Minutes of Exercise per Session: 110 min  Stress: Stress Concern Present (07/09/2023)   [employer] of Occupational Health - Occupational Stress Questionnaire    Feeling of Stress : Very much  Social Connections: Moderately Isolated (07/09/2023)   Social Connection and Isolation Panel [NHANES]    Frequency of Communication with Friends and Family: More than three times a week    Frequency of Social Gatherings with Friends and Family: More than three times a week    Attends Religious Services: 1 to 4 times per year    Active Member of [employer] or Organizations: No    Attends Banker Meetings: Not on file    Marital Status: Widowed  Intimate Partner Violence: Not on file    Review of Systems:  Constitutional: No weight loss, fever or chills Cardiovascular: No chest pain   Respiratory: No SOB Gastrointestinal: See HPI and otherwise negative   Physical Exam:  Vital signs: BP (!) 110/50   Pulse 64   Ht 5\' 3"  (1.6 m)   Wt 188 lb 6.4 oz (85.5 kg)   BMI 33.37 kg/m    Constitutional:   Pleasant Elderly Caucasian female appears to be in NAD, Well developed, Well nourished, alert and cooperative Respiratory:  Respirations even and unlabored. Lungs clear to auscultation bilaterally.   No wheezes, crackles, or rhonchi.  Cardiovascular: Normal S1, S2. No MRG. Regular rate and rhythm. No peripheral edema, cyanosis or pallor.  Gastrointestinal:  Soft, nondistended, nontender. No rebound or guarding. Normal bowel sounds. No appreciable masses or hepatomegaly. Psychiatric: Demonstrates good judgement and reason without abnormal affect or behaviors.  RELEVANT LABS AND IMAGING: CBC    Component Value Date/Time   WBC 6.6 07/29/2023 0806   WBC 5.3 07/11/2023 0932   RBC 4.35 07/29/2023 0806   RBC 4.10 07/11/2023 0932   HGB 11.8 07/29/2023 0806   HCT 35.2 07/29/2023 0806   PLT 314 07/29/2023 0806   MCV 81 07/29/2023 0806   MCH 27.1 07/29/2023 0806   MCH 30.2 04/21/2020 1316   MCHC 33.5 07/29/2023 0806   MCHC 33.1 07/11/2023 0932   RDW 16.2 (H) 07/29/2023 0806   LYMPHSABS 1.7 07/11/2023 0932   MONOABS 0.3 07/11/2023 0932   EOSABS 0.2 07/11/2023 0932   BASOSABS 0.1 07/11/2023 0932    CMP     Component Value Date/Time   NA 141 07/29/2023 0806   K 4.2 07/29/2023 0806   CL 105 07/29/2023 0806   CO2 26 07/29/2023 0806   GLUCOSE 106 (H) 07/29/2023 0806   GLUCOSE 90 06/01/2023 1133   BUN 14 07/29/2023 0806   CREATININE 0.90 07/29/2023 0806   CALCIUM 9.5 07/29/2023 0806   PROT 6.8 06/01/2023 1133   ALBUMIN 4.1 06/01/2023 1133   AST 22 06/01/2023 1133   ALT 13 06/01/2023 1133   ALKPHOS 70 06/01/2023 1133   BILITOT 0.8 06/01/2023 1133   GFRNONAA >60 04/21/2020 1316   GFRAA >60 04/21/2020 1316    Assessment: 1.  IDA: History of recent colonoscopy with AVMs which are likely the source, recently saw hematology in regards to a possible iron infusion, they just kept her on oral iron instead, doing well, recommend repeat CBC in 3 months 2.  Dysphagia: Esophagram with esophageal dysmotility and GERD with reflux esophagitis, symptoms some better on Omeprazole 40 twice daily, patient declines speech  pathology referral at this point; likely multifactorial from reflux and dysmotility  Plan: 1.  Continue Omeprazole 40 mg twice daily.  Patient does not need refills at this point but they would be good for a year from today. 2.  Discussed iron deficiency anemia.  She she can continue on a daily oral iron supplement as per recommendations from hematology.  She can have repeat CBC and iron studies in December with her PCP.  As long as things look good she does not need any further workup and does not need to return to hematology. 3.  Patient to follow in clinic with [place] as needed.  Hyacinth Meeker, PA-C [facility] 09/20/2023, 10:37 AM  Cc: Shelva Majestic, MD

## 2023-09-27 ENCOUNTER — Encounter: Payer: Self-pay | Admitting: Family Medicine

## 2023-09-27 ENCOUNTER — Ambulatory Visit (INDEPENDENT_AMBULATORY_CARE_PROVIDER_SITE_OTHER): Payer: Medicare HMO | Admitting: Family Medicine

## 2023-09-27 VITALS — BP 138/73 | HR 67 | Temp 96.8°F | Ht 63.0 in | Wt 192.2 lb

## 2023-09-27 DIAGNOSIS — I10 Essential (primary) hypertension: Secondary | ICD-10-CM | POA: Diagnosis not present

## 2023-09-27 DIAGNOSIS — Z23 Encounter for immunization: Secondary | ICD-10-CM | POA: Diagnosis not present

## 2023-09-27 DIAGNOSIS — R600 Localized edema: Secondary | ICD-10-CM | POA: Diagnosis not present

## 2023-09-27 MED ORDER — FUROSEMIDE 20 MG PO TABS: 20.0000 mg | ORAL_TABLET | Freq: Every day | ORAL | 0 refills | Status: DC

## 2023-09-27 NOTE — Progress Notes (Signed)
   [name] is a 82 y.o. female who presents today for an office visit.  Assessment/Plan:  New/Acute Problems: Right Leg Swelling  Likely secondary to venous insufficiency however need to rule out DVT especially in light of her recent flight.  Will check stat ultrasound to rule this out.  No signs or symptoms concerning for volume overload or CHF.  We discussed conservative measures for swelling including leg elevation, compression, and salt avoidance.  Additionally she is on amlodipine which could be contributing as well.  We will continue current dose for now however if this continues to be a persistent issue will consider decreasing dose or switching to alternative antihypertensive medication.  Due to her open areas on her leg from previous biopsy site would be reasonable for [place] to start a few days of Lasix as well.  Will send in 20 mg tablets use every day for the next 3 days, then use as needed.  She has done well with this in the past.  Chronic Problems Addressed Today: Essential hypertension Blood pressure is at goal today on current regimen amlodipine 10 mg daily, Imdur 60 mg daily.  Discussed with patient that the amlodipine may be contributing to her leg swelling and we may need to switch to an alternative medication if leg swelling persists and DVT workup is negative.    Subjective:  HPI:  Patient here with right leg swelling.  Started 5 days ago while on a trip to [place] for her brother's wedding.  The evening she landed started noticing swelling.  It has progressed since then.  Symptoms do seem to come and go and get worse in the evening.  She has pain to the area.  Minimal redness.  She has never had anything like this in the past.  No shortness of breath.  No chest pain.  No orthopnea.  No swelling in her left leg.  No specific treatments tried.        Objective:  Physical Exam: BP 138/73   Pulse 67   Temp (!) 96.8 F (36 C) (Temporal)   Ht 5\' 3"  (1.6 m)    Wt 192 lb 3.2 oz (87.2 kg)   SpO2 98%   BMI 34.05 kg/m   Gen: No acute distress, resting comfortably CV: Regular rate and rhythm with no murmurs appreciated Pulm: Normal work of breathing, clear to auscultation bilaterally with no crackles, wheezes, or rhonchi MUSCULOSKELETAL: - Right Leg: 2+ pitting edema to knee.  Small amount of erythema.  No warmth to touch.  No drainage. Skin: 2 small open areas on the left leg and right arm at prior biopsy sites. Neuro: Grossly normal, moves all extremities Psych: Normal affect and thought content      [name] M. Jimmey Ralph, MD 09/27/2023 10:34 AM

## 2023-09-27 NOTE — Patient Instructions (Signed)
It was very nice to see you today!  We need to make sure that you do not have a blood clot.  Will check a stat ultrasound.  Please use the fluid pill for the next couple of days if you have any leftover swelling.  Keep your legs elevated and use compression stockings.  Avoid salt.  Let us know if you have any change in symptoms.  If this continues to be an ongoing issue we may need to switch her amlodipine to an alternative.  Return if symptoms worsen or fail to improve.   Take care, Dr Jimmey Ralph  PLEASE NOTE:  If you had any lab tests, please let us know if you have not heard back within a few days. You may see your results on mychart before we have a chance to review them but we will give you a call once they are reviewed by [place].   If we ordered any referrals today, please let us know if you have not heard from their office within the next week.   If you had any urgent prescriptions sent in today, please check with the pharmacy within an hour of our visit to make sure the prescription was transmitted appropriately.   Please try these tips to maintain a healthy lifestyle:  Eat at least 3 REAL meals and 1-2 snacks per day.  Aim for no more than 5 hours between eating.  If you eat breakfast, please do so within one hour of getting up.   Each meal should contain half fruits/vegetables, one quarter protein, and one quarter carbs (no bigger than a computer mouse)  Cut down on sweet beverages. This includes juice, soda, and sweet tea.   Drink at least 1 glass of water with each meal and aim for at least 8 glasses per day  Exercise at least 150 minutes every week.

## 2023-09-28 ENCOUNTER — Ambulatory Visit (HOSPITAL_COMMUNITY)
Admission: RE | Admit: 2023-09-28 | Discharge: 2023-09-28 | Disposition: A | Discharge status: Home / Self Care | Payer: Medicare HMO | Source: Ambulatory Visit | Attending: Family Medicine | Admitting: Family Medicine

## 2023-09-28 DIAGNOSIS — R600 Localized edema: Secondary | ICD-10-CM | POA: Diagnosis not present

## 2023-09-29 NOTE — Progress Notes (Signed)
Great news!  Ultrasound showed no blood clots.  We do not need to start any blood thinners.  She should continue with the treatment plan we discussed at her office visit including leg elevation, compression, and salt avoidance.  She should let us know if her symptoms are not improving.

## 2023-10-23 ENCOUNTER — Other Ambulatory Visit: Payer: Self-pay | Admitting: Family Medicine

## 2023-11-10 ENCOUNTER — Encounter: Payer: Self-pay | Admitting: Family Medicine

## 2023-11-10 ENCOUNTER — Ambulatory Visit (INDEPENDENT_AMBULATORY_CARE_PROVIDER_SITE_OTHER): Payer: Medicare HMO | Admitting: Family Medicine

## 2023-11-10 VITALS — BP 130/70 | HR 72 | Temp 98.0°F | Ht 63.0 in | Wt 186.0 lb

## 2023-11-10 DIAGNOSIS — I10 Essential (primary) hypertension: Secondary | ICD-10-CM | POA: Diagnosis not present

## 2023-11-10 DIAGNOSIS — I2510 Atherosclerotic heart disease of native coronary artery without angina pectoris: Secondary | ICD-10-CM | POA: Diagnosis not present

## 2023-11-10 DIAGNOSIS — Z85828 Personal history of other malignant neoplasm of skin: Secondary | ICD-10-CM

## 2023-11-10 DIAGNOSIS — D500 Iron deficiency anemia secondary to blood loss (chronic): Secondary | ICD-10-CM | POA: Diagnosis not present

## 2023-11-10 DIAGNOSIS — E7800 Pure hypercholesterolemia, unspecified: Secondary | ICD-10-CM | POA: Diagnosis not present

## 2023-11-10 DIAGNOSIS — R609 Edema, unspecified: Secondary | ICD-10-CM | POA: Diagnosis not present

## 2023-11-10 LAB — CBC WITH DIFFERENTIAL/PLATELET
Basophils Absolute: 0.1 10*3/uL (ref 0.0–0.1)
Basophils Relative: 0.7 % (ref 0.0–3.0)
Eosinophils Absolute: 0.3 10*3/uL (ref 0.0–0.7)
Eosinophils Relative: 4.7 % (ref 0.0–5.0)
HCT: 36.4 % (ref 36.0–46.0)
Hemoglobin: 12.3 g/dL (ref 12.0–15.0)
Lymphocytes Relative: 24.8 % (ref 12.0–46.0)
Lymphs Abs: 1.7 10*3/uL (ref 0.7–4.0)
MCHC: 33.8 g/dL (ref 30.0–36.0)
MCV: 86.4 fL (ref 78.0–100.0)
Monocytes Absolute: 0.4 10*3/uL (ref 0.1–1.0)
Monocytes Relative: 6 % (ref 3.0–12.0)
Neutro Abs: 4.4 10*3/uL (ref 1.4–7.7)
Neutrophils Relative %: 63.8 % (ref 43.0–77.0)
Platelets: 284 10*3/uL (ref 150.0–400.0)
RBC: 4.21 Mil/uL (ref 3.87–5.11)
RDW: 16.8 % — ABNORMAL HIGH (ref 11.5–15.5)
WBC: 6.9 10*3/uL (ref 4.0–10.5)

## 2023-11-10 LAB — COMPREHENSIVE METABOLIC PANEL
ALT: 14 U/L (ref 0–35)
AST: 24 U/L (ref 0–37)
Albumin: 4 g/dL (ref 3.5–5.2)
Alkaline Phosphatase: 77 U/L (ref 39–117)
BUN: 15 mg/dL (ref 6–23)
CO2: 27 meq/L (ref 19–32)
Calcium: 9.2 mg/dL (ref 8.4–10.5)
Chloride: 106 meq/L (ref 96–112)
Creatinine, Ser: 0.75 mg/dL (ref 0.40–1.20)
GFR: 74.47 mL/min (ref >60.00)
Glucose, Bld: 95 mg/dL (ref 70–99)
Potassium: 3.8 meq/L (ref 3.5–5.1)
Sodium: 141 meq/L (ref 135–145)
Total Bilirubin: 0.8 mg/dL (ref 0.2–1.2)
Total Protein: 7 g/dL (ref 6.0–8.3)

## 2023-11-10 LAB — URINALYSIS, ROUTINE W REFLEX MICROSCOPIC
Bilirubin Urine: NEGATIVE
Hgb urine dipstick: NEGATIVE
Ketones, ur: NEGATIVE
Nitrite: NEGATIVE
Specific Gravity, Urine: 1.01 (ref 1.000–1.030)
Total Protein, Urine: NEGATIVE
Urine Glucose: NEGATIVE
Urobilinogen, UA: 0.2 (ref 0.0–1.0)
pH: 7 (ref 5.0–8.0)

## 2023-11-10 LAB — LIPID PANEL
Cholesterol: 128 mg/dL (ref 0–200)
HDL: 48.7 mg/dL (ref >39.00)
LDL Cholesterol: 59 mg/dL (ref 0–99)
NonHDL: 79.25
Total CHOL/HDL Ratio: 3
Triglycerides: 101 mg/dL (ref 0.0–149.0)
VLDL: 20.2 mg/dL (ref 0.0–40.0)

## 2023-11-10 LAB — IBC + FERRITIN
Ferritin: 17.2 ng/mL (ref 10.0–291.0)
Iron: 44 ug/dL (ref 42–145)
Saturation Ratios: 12.1 % — ABNORMAL LOW (ref 20.0–50.0)
TIBC: 362.6 ug/dL (ref 250.0–450.0)
Transferrin: 259 mg/dL (ref 212.0–360.0)

## 2023-11-10 LAB — TSH: TSH: 6.45 u[IU]/mL — ABNORMAL HIGH (ref 0.35–5.50)

## 2023-11-10 MED ORDER — AMLODIPINE BESYLATE 5 MG PO TABS: 5.0000 mg | ORAL_TABLET | Freq: Every day | ORAL | 3 refills | Status: DC

## 2023-11-10 NOTE — Patient Instructions (Addendum)
Health Maintenance Due  Topic Date Due   Medicare Annual Wellness (AWV)  06/16/2019  You are eligible to schedule your annual wellness visit with our nurse specialist [name].  Please consider scheduling this before you leave today  I think the amlodipine is contributing to your swelling- lets try 5 mg and see if you do ok with that (you may need another medicine to help the blood pressure)   Omron series 3- check blood pressure every day or every other day after resting for 5 minutes with feet flat on ground, back against chair, arm at heart level and not when you are stressed! Goal at home <135/85  Recommended follow up: Return for next already scheduled visit or sooner if needed. So we can recheck blood pressure

## 2023-11-10 NOTE — Progress Notes (Signed)
Phone [phone] In person visit   Subjective:   [name] is a 82 y.o. year old very pleasant female patient who presents for/with See problem oriented charting Chief Complaint  Patient presents with   Leg Swelling    Pt c/o bilateral leg swelling that started 09/25   Past Medical History-  Patient Active Problem List   Diagnosis Date Noted   Advanced directives, counseling/discussion 01/26/2017    Priority: High   nonobstructive CAD (coronary artery disease) 12/01/2015    Priority: High   Memory loss 12/01/2015    Priority: High   Itchy skin 05/30/2020    Priority: Medium    History of adenomatous polyp of colon 08/01/2017    Priority: Medium    Varicose veins of both lower extremities 12/01/2015    Priority: Medium    Hyperlipidemia, unspecified 05/19/2007    Priority: Medium    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HCC) 05/19/2007    Priority: Medium    Essential hypertension 05/19/2007    Priority: Medium    Insomnia 12/01/2015    Priority: Low   Glaucoma 12/01/2015    Priority: Low   Hammer toe     Priority: Low   Arthritis 08/29/2012    Priority: Low   GERD 09/06/2009    Priority: Low   Edema 09/06/2009    Priority: Low   History of syncope 09/06/2009    Priority: Low   Hyperglycemia 05/19/2007    Priority: Low   Trigger finger, right middle finger 09/05/2017    Priority: 1.   Primary osteoarthritis of both knees 03/10/2017    Priority: 1.   Primary osteoarthritis of both hands 03/09/2017    Priority: 1.   Primary osteoarthritis of both feet 03/09/2017    Priority: 1.   History of skin cancer 11/10/2023   Diaphoresis 05/12/2018   Rheumatoid factor positive 03/09/2017    Medications- reviewed and updated Current Outpatient Medications  Medication Sig Dispense Refill   amLODipine (NORVASC) 10 MG tablet Take 1 tablet (10 mg total) by mouth daily. 90 tablet 3   aspirin 81 MG tablet Take 81 mg by mouth daily.      atorvastatin (LIPITOR)  80 MG tablet Take 1 tablet (80 mg total) by mouth every evening. 90 tablet 3   Cholecalciferol (VITAMIN D3) 125 MCG (5000 UT) CAPS Take 5,000 Units by mouth daily.     diclofenac sodium (VOLTAREN) 1 % GEL Apply 3 gm to 3 large joints up to 3 times a day.Dispense 3 tubes with 3 refills. (Patient taking differently: Apply 3 g topically 3 (three) times daily as needed (joint pain). Apply 3 gm to 3 large joints up to 3 times a day.Dispense 3 tubes with 3 refills.) 3 Tube 3   escitalopram (LEXAPRO) 20 MG tablet Take 1 tablet (20 mg total) by mouth daily. 90 tablet 3   ezetimibe (ZETIA) 10 MG tablet Take 1 tablet (10 mg total) by mouth daily. 90 tablet 3   ferrous sulfate 325 (65 FE) MG EC tablet Take 1 tablet (325 mg total) by mouth daily with breakfast. 90 tablet 0   furosemide (LASIX) 20 MG tablet Take 1 tablet (20 mg total) by mouth daily. 30 tablet 0   isosorbide mononitrate (IMDUR) 60 MG 24 hr tablet Take 1 tablet (60 mg total) by mouth daily. 90 tablet 3   nitroGLYCERIN (NITROSTAT) 0.4 MG SL tablet Place 1 tablet (0.4 mg total) under the tongue every 5 (five) minutes as needed for chest  pain. 25 tablet 3   omeprazole (PRILOSEC) 40 MG capsule Take 1 capsule (40 mg total) by mouth 2 (two) times daily. 60 capsule 5   triamcinolone cream (KENALOG) 0.1 % Apply 1 Application topically 2 (two) times daily. For 7-10 days maximum (Patient taking differently: Apply 1 Application topically 2 (two) times daily as needed (irritation). For 7-10 days maximum) 80 g 0   vitamin B-12 (CYANOCOBALAMIN) 1000 MCG tablet Take 1,000 mcg by mouth daily.     No current facility-administered medications for this visit.     Objective:  BP 130/70   Pulse 72   Temp 98 F (36.7 C)   Ht 5\' 3"  (1.6 m)   Wt 186 lb (84.4 kg)   SpO2 95%   BMI 32.95 kg/m  Gen: NAD, resting comfortably CV: RRR no murmurs rubs or gallops Lungs: CTAB no crackles, wheeze, rhonchi Ext: 1+ edema bilaterally slightly worse on the right Skin:  warm, dry     Assessment and Plan   # CAD  #hyperlipidemia S: Medication:Atorvastatin 80 mg, aspirin 81 mg, imdur 60 mg -zetia added June 2024 - patient with exertional chest pain in July leading to cath still with nonobstructive disease- ost LAD to proximal LAD 40% stenosed and ost consult to proximal Culture(s) at 50% as most severe -denies chest pain with medicine  Lab Results  Component Value Date   CHOL 160 08/04/2023   HDL 56.70 08/04/2023   LDLCALC 72 08/04/2023   LDLDIRECT 65.0 05/30/2020   TRIG 157.0 (H) 08/04/2023   CHOLHDL 3 08/04/2023  A/P:  coronary artery disease but with anginal symptoms improved on imdur- continue current medications Lipids hopefully improved on zetia- update lipid panel today   #Edema #hypertension S: patient reports increased edema since 09/21/23. Of note she is on high dose amlodipine at 10 mg. Last echocardiogram 03/04/22 with EF 60%, mild LVH, mild to moderate mitral regurgitation, aortic valve sclerosis. Prior anemia but improved most recently into normal range though is due for repeat iron studies  (iron deficiency anemia likely due to avms). Saw Dr. Jimmey Ralph on 09/27/23 with right leg edema thought to likely be related to venous insufficiency-did have a recent flight so venous duplex was ordered which was negative for DVT.  She was advised to watch her salt intake, elevate the leg, use compression.  They also did a few days of Lasix 20 mg - 3 days to start then as needed.. she is wearing compression stockings some  medication: Amlodipine 10 mg increased July 8th for antianginal effect and blood pressure control by Dr. Jens Som, Imdur 60 mg  Home readings #s: no recent checks- doesn't have cuff A/P: blood pressure is controlled but "I think the amlodipine is contributing to your swelling- lets try 5 mg and see if you do ok with that (you may need another medicine to help the blood pressure)   Omron series 3- check blood pressure every day or every  other day after resting for 5 minutes with feet flat on ground, back against chair, arm at heart level and not when you are stressed! Goal at home <135/85" -encouraged use of compression stockings -also update labs CBC, CMP, TSH with edema  Recommended follow up: Return for next already scheduled visit or sooner if needed. Future Appointments  Date Time Provider Department Center  12/08/2023 11:20 AM Shelva Majestic, MD [facility]  01/13/2024  9:20 AM Shelva Majestic, MD [facility]  04/12/2024  3:20 PM Pollyann Savoy, MD [facility] None  Lab/Order associations:   ICD-10-CM   1. Essential hypertension  I10 Comprehensive metabolic panel    CBC with Differential/Platelet    Urinalysis, Routine w reflex microscopic    2. Coronary artery disease involving native coronary artery of native heart without angina pectoris  I25.10     3. Pure hypercholesterolemia  E78.00 TSH    Lipid panel    4. History of skin cancer  Z85.828     5. Iron deficiency anemia due to chronic blood loss  D50.0 CBC with Differential/Platelet    IBC + Ferritin    6. Edema, unspecified type  R60.9 TSH    Urinalysis, Routine w reflex microscopic      Meds ordered this encounter  Medications   amLODipine (NORVASC) 5 MG tablet    Sig: Take 1 tablet (5 mg total) by mouth daily.    Dispense:  90 tablet    Refill:  3    Return precautions advised.  Tana Conch, MD

## 2023-11-14 ENCOUNTER — Telehealth: Payer: Self-pay | Admitting: Family Medicine

## 2023-11-14 DIAGNOSIS — H9191 Unspecified hearing loss, right ear: Secondary | ICD-10-CM

## 2023-11-14 NOTE — Telephone Encounter (Signed)
Reports right sided hearing loss- normal on the left at [facility] testing - I recommended ENT referral and placed today

## 2023-11-14 NOTE — Telephone Encounter (Signed)
See below

## 2023-11-14 NOTE — Telephone Encounter (Signed)
Patient states she had hearing checked and was recommended to go to an audiologist. Patient would like to know who PCP thinks would be best. Currently PCP has no openings for an OV to discuss this further. Patient wants only PCP's opinion. Please Advise.

## 2023-11-28 ENCOUNTER — Encounter (INDEPENDENT_AMBULATORY_CARE_PROVIDER_SITE_OTHER): Payer: Self-pay

## 2023-11-28 ENCOUNTER — Ambulatory Visit (INDEPENDENT_AMBULATORY_CARE_PROVIDER_SITE_OTHER): Payer: Medicare HMO | Admitting: Otolaryngology

## 2023-11-28 ENCOUNTER — Ambulatory Visit (INDEPENDENT_AMBULATORY_CARE_PROVIDER_SITE_OTHER): Payer: Medicare HMO | Admitting: Audiology

## 2023-11-28 VITALS — Ht 63.0 in | Wt 186.0 lb

## 2023-11-28 DIAGNOSIS — H918X3 Other specified hearing loss, bilateral: Secondary | ICD-10-CM

## 2023-11-28 DIAGNOSIS — H905 Unspecified sensorineural hearing loss: Secondary | ICD-10-CM

## 2023-11-28 DIAGNOSIS — H9313 Tinnitus, bilateral: Secondary | ICD-10-CM

## 2023-11-28 DIAGNOSIS — H903 Sensorineural hearing loss, bilateral: Secondary | ICD-10-CM

## 2023-11-28 NOTE — Patient Instructions (Signed)
I have ordered an imaging study for you to complete prior to your next visit. Please call [facility] Scheduling at [phone] to schedule your imaging if you have not received a call within 24 hours. If you are unable to complete your imaging study prior to your next scheduled visit please call our office to let us know.

## 2023-11-28 NOTE — Progress Notes (Signed)
Dear Dr. Durene Cal, Here is my assessment for our mutual patient, [name]. Thank you for allowing me the opportunity to care for your patient. Please do not hesitate to contact me should you have any other questions. Sincerely, Dr. Jovita Kussmaul  Otolaryngology Clinic Note Referring provider: Dr. Durene Cal HPI:  [name] is a 82 y.o. female kindly referred by Dr. Durene Cal for evaluation of bilateral hearing loss. Initial visit (11/28/2023): Patient reports: she first noticed bilateral hearing loss in November 2024 during early voting. She reports that before then, it was not a problem, but other people have noticed it around her (TV too loud, saying "what"). Background noise makes it much worse. Some bilateral tinnitus - low pitched, non-pulsatile. She does think her right ear is worse. Patient denies: ear pain, fullness, vertigo, drainage. Patient additionally denies: deep pain in ear canal, eustachian tube symptoms such as popping, crackling, sensitivity to pressure changes Patient also denies barotrauma, vestibular suppressant use, ototoxic medication use Prior ear surgery: no No significant noise exposure.  PMHx: CAD, HLD, HTN, peripheral edema  H&N Surgery: no Personal or FHx of bleeding dz or anesthesia difficulty: no   GLP-1: no AP/AC: 81 mg  Tobacco: no. Alcohol: no. 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 Lives in [place], [place] by herself.  Independent Review of Additional Tests or Records:  [facility] (03/2020): mastoid and middle ear space clear; no significant bony labyrinth pathology MRI (04/22/2020): no obvious retrocochlear lesions    Dr. Cecilie Kicks notes (11/10/2023 and 11/14/2023): CAD, HTN, noted asymmetric HL at [facility]. Referred to ENT  12/10/2023 Audiogram was independently reviewed and interpreted by me and it reveals Asymmetric SNHL with right ear worse than left with significantly worse Discrim scores (76% AD v/s 92% AS); type A/A tymps   [facility] audio also  reviewed (patient reports done in late Nov 2024): Right ear thresholds in higher frequencies worse than left (independently reviewed)   SNHL= Sensorineural hearing loss   PMH/Meds/All/SocHx/FamHx/ROS:   Past Medical History:  Diagnosis Date   Anginal pain (HCC) 08/29/2012   "have had problems w/this"   Anxiety 08/29/2012   Arthritis 08/29/2012   right hand, Seen by Dr. Corliss Skains   CAD (coronary artery disease)    nonobstructive   Cataract 03/30/07   10/16/09   Depression    Diabetes mellitus without complication (HCC)    Exertional dyspnea 08/29/2012   GERD (gastroesophageal reflux disease)    Hammer toe    surgery x2, second with Dr. Lajoyce Corners- only other option amputatoin   HLD (hyperlipidemia)    HTN (hypertension)    PNEUMONIA 07/04/2007   Qualifier: Diagnosis of  By: Drue Novel MD, Nolon Rod.    Syncope and collapse 08/29/2012   "first time ever"     Past Surgical History:  Procedure Laterality Date   ABDOMINAL HYSTERECTOMY  08/27/1969   APPENDECTOMY  ~ 1970   CATARACT EXTRACTION W/ INTRAOCULAR LENS  IMPLANT, BILATERAL  ~ 2010   bilateral   EYE SURGERY  03/30/07   10/16/09   LEFT HEART CATH AND CORONARY ANGIOGRAPHY N/A 08/01/2023   Procedure: LEFT HEART CATH AND CORONARY ANGIOGRAPHY;  Surgeon: Kathleene Hazel, MD;  Location: [facility];  Service: Cardiovascular;  Laterality: N/A;   TONSILLECTOMY AND ADENOIDECTOMY  08/27/1949   VEIN LIGATION AND STRIPPING  12/27/2010   bilaterally    Family History  Problem Relation Age of Onset   Heart disease Mother        brother, father   Diabetes Mother  sister, brother   Vision loss Mother    Suicidality Father        74   Depression Father    Heart disease Father    Vision loss Father    Coronary artery disease Brother    Colon cancer Brother    Stroke Brother    ADD / ADHD Brother    Anxiety disorder Brother    Cancer Brother    Depression Brother    Diabetes Brother    Alcoholism Brother     Alcohol abuse Brother    Depression Brother    Diabetes Brother    Vision loss Brother    Ovarian cancer Daughter    Arthritis Sister    Diabetes Sister    Hearing loss Sister    Diabetes Sister    Esophageal cancer Neg Hx    Rectal cancer Neg Hx    Stomach cancer Neg Hx    Liver cancer Neg Hx      Social Connections: Moderately Integrated (12/08/2023)   Social Connection and Isolation Panel [NHANES]    Frequency of Communication with Friends and Family: More than three times a week    Frequency of Social Gatherings with Friends and Family: Twice a week    Attends Religious Services: 1 to 4 times per year    Active Member of [employer] or Organizations: Yes    Attends Banker Meetings: More than 4 times per year    Marital Status: Widowed  Recent Concern: Social Connections - Moderately Isolated (11/04/2023)   Social Connection and Isolation Panel [NHANES]    Frequency of Communication with Friends and Family: More than three times a week    Frequency of Social Gatherings with Friends and Family: More than three times a week    Attends Religious Services: 1 to 4 times per year    Active Member of [employer] or Organizations: No    Attends Banker Meetings: Not on file    Marital Status: Widowed      Current Outpatient Medications:    amLODipine (NORVASC) 5 MG tablet, Take 1 tablet (5 mg total) by mouth daily., Disp: 90 tablet, Rfl: 3   aspirin 81 MG tablet, Take 81 mg by mouth daily. , Disp: , Rfl:    atorvastatin (LIPITOR) 80 MG tablet, Take 1 tablet (80 mg total) by mouth every evening., Disp: 90 tablet, Rfl: 3   Cholecalciferol (VITAMIN D3) 125 MCG (5000 UT) CAPS, Take 5,000 Units by mouth daily., Disp: , Rfl:    diclofenac sodium (VOLTAREN) 1 % GEL, Apply 3 gm to 3 large joints up to 3 times a day.Dispense 3 tubes with 3 refills. (Patient taking differently: Apply 3 g topically 3 (three) times daily as needed (joint pain). Apply 3 gm to 3 large joints up to 3  times a day.Dispense 3 tubes with 3 refills.), Disp: 3 Tube, Rfl: 3   escitalopram (LEXAPRO) 20 MG tablet, Take 1 tablet (20 mg total) by mouth daily., Disp: 90 tablet, Rfl: 3   ezetimibe (ZETIA) 10 MG tablet, Take 1 tablet (10 mg total) by mouth daily., Disp: 90 tablet, Rfl: 3   ferrous sulfate 325 (65 FE) MG EC tablet, Take 1 tablet (325 mg total) by mouth daily with breakfast., Disp: 90 tablet, Rfl: 0   furosemide (LASIX) 20 MG tablet, Take 1 tablet (20 mg total) by mouth daily., Disp: 30 tablet, Rfl: 0   isosorbide mononitrate (IMDUR) 60 MG 24 hr tablet, Take 1 tablet (  60 mg total) by mouth daily., Disp: 90 tablet, Rfl: 3   nitroGLYCERIN (NITROSTAT) 0.4 MG SL tablet, Place 1 tablet (0.4 mg total) under the tongue every 5 (five) minutes as needed for chest pain., Disp: 25 tablet, Rfl: 3   omeprazole (PRILOSEC) 40 MG capsule, Take 1 capsule (40 mg total) by mouth 2 (two) times daily., Disp: 60 capsule, Rfl: 5   triamcinolone cream (KENALOG) 0.1 %, Apply 1 Application topically 2 (two) times daily. For 7-10 days maximum (Patient taking differently: Apply 1 Application topically 2 (two) times daily as needed (irritation). For 7-10 days maximum), Disp: 80 g, Rfl: 0   vitamin B-12 (CYANOCOBALAMIN) 1000 MCG tablet, Take 1,000 mcg by mouth daily., Disp: , Rfl:    Physical Exam:   Ht 5\' 3"  (1.6 m)   Wt 186 lb (84.4 kg)   BMI 32.95 kg/m   Salient findings:  CN II-XII intact  Bilateral EAC clear and TM intact with well pneumatized middle ear spaces Weber 512: midline Rinne 512: AC > BC b/l  Anterior rhinoscopy: Septum relatively midline; bilateral inferior turbinates without significant hypertrophy No lesions of oral cavity/oropharynx No obviously palpable neck masses/lymphadenopathy/thyromegaly No respiratory distress or stridor  Seprately Identifiable Procedures:  None  Impression & Plans:  Annete Jeremy is a 82 y.o. female with:  1. Asymmetrical hearing loss   2. Bilateral tinnitus     Long-standing likely HL but now getting worse. Clear asymmetry in HL, no obvious antecedent event. We discussed etiology including genetic, v/s noise induced. Would be prudent to rule out a CPA mass given asymmetry although prior MRI did not show a CPA mass. - MRI IAC - Discussed amplification and repeat HT for obs, but declined - advised her to call back in case something changes or she changes her mind - will call with results  See below regarding exact medications prescribed this encounter including dosages and route: No orders of the defined types were placed in this encounter.     Thank you for allowing me the opportunity to care for your patient. Please do not hesitate to contact me should you have any other questions.  Sincerely, Jovita Kussmaul, MD Otolarynoglogist (ENT), [facility] Phone: [phone] Fax: [phone]  12/10/2023, 9:47 AM   MDM:  Level 4 Complexity/Problems addressed: chronic problem with exacerbation Data complexity: mod - independent review of outside audio and MRI - Morbidity: unclear - likely low  - Prescription Drug prescribed or managed: no

## 2023-11-28 NOTE — Progress Notes (Signed)
  [address], [address] [place], [place] [address] [phone]  Audiological Evaluation    Name: [name]     DOB:   [birth year]      MRN:   [id number]                                                                                     Service Date: 11/28/2023     Accompanied by: none   Patient comes today after Dr. Allena Katz, ENT sent a referral for a hearing evaluation due to concerns with hearing loss asymmetry.   Symptoms Yes Details  Hearing loss  [x]  Worse in the right ear. Had a test done at [facility] and because it showed a right hearing asymmetry she was referred to our clinic.  Tinnitus  [x]  Maybe in both ears  Ear pain/ Ear infections  []    Balance problems  []    Noise exposure  []    Previous ear surgeries  []    Family history  []    Amplification  []    Other  []      Otoscopy: Right ear: Clear external ear canals and notable landmarks visualized on the tympanic membrane. Left ear:  Clear external ear canals and notable landmarks visualized on the tympanic membrane.  Tympanometry: Right ear: Type A- Normal external ear canal volume with normal middle ear pressure and tympanic membrane compliance Left ear: Type As- Normal external ear canal volume with normal middle ear pressure and low tympanic membrane compliance    Pure tone Audiometry: Right ear- Normal hearing from [phone] Hz, then mild to severe sensorineural hearing loss from 250 Hz - 8000 Hz. Left ear-  Normal hearing from [phone] Hz and at 2000 Hz and mild to moderately severe sensorineural hearing loss at 1500 and 3000-8000 Hz .   The hearing test results were completed under headphones and re-checked with inserts and results are deemed to be of good to fair reliability. Test technique:  conventional     Speech Audiometry: Right ear- Speech Reception Threshold (SRT) was obtained at 25 dBHL Left ear-Speech Reception Threshold (SRT) was obtained at 15 dBHL   Word Recognition Score Tested using  NU-6 (MLV) Right ear: 76% was obtained at a presentation level of 75 dBHL with contralateral masking which is deemed as  fair. Left ear: 92% was obtained at a presentation level of 70 dBHL with contralateral masking which is deemed as  excellent.    Impression: There is a significant difference in pure-tone thresholds between ears, worse of the right ear.    There is a significant difference in word recognition scores , worse of the right ear.     Recommendations: Follow up with ENT as scheduled for today. Return for a hearing evaluation if concerns with hearing changes arise or per MD recommendation. Consider a communication needs assessment after medical clearance for hearing aids is obtained.   Hodari Chuba MARIE LEROUX-MARTINEZ, AUD

## 2023-12-01 ENCOUNTER — Ambulatory Visit (HOSPITAL_COMMUNITY)
Admission: RE | Admit: 2023-12-01 | Discharge: 2023-12-01 | Disposition: A | Discharge status: Home / Self Care | Payer: Medicare HMO | Source: Ambulatory Visit | Attending: Otolaryngology | Admitting: Otolaryngology

## 2023-12-01 DIAGNOSIS — H918X3 Other specified hearing loss, bilateral: Secondary | ICD-10-CM | POA: Diagnosis not present

## 2023-12-01 DIAGNOSIS — H9190 Unspecified hearing loss, unspecified ear: Secondary | ICD-10-CM | POA: Diagnosis not present

## 2023-12-01 DIAGNOSIS — I6782 Cerebral ischemia: Secondary | ICD-10-CM | POA: Diagnosis not present

## 2023-12-01 MED ORDER — GADOBUTROL 1 MMOL/ML IV SOLN
8.0000 mL | Freq: Once | INTRAVENOUS | Status: AC | PRN
  Administered 2023-12-01: 8 mL via INTRAVENOUS

## 2023-12-08 ENCOUNTER — Ambulatory Visit: Payer: Medicare HMO | Admitting: Family Medicine

## 2023-12-08 VITALS — BP 138/70 | HR 66 | Temp 97.4°F | Wt 183.4 lb

## 2023-12-08 DIAGNOSIS — I10 Essential (primary) hypertension: Secondary | ICD-10-CM | POA: Diagnosis not present

## 2023-12-08 DIAGNOSIS — R0989 Other specified symptoms and signs involving the circulatory and respiratory systems: Secondary | ICD-10-CM | POA: Diagnosis not present

## 2023-12-08 DIAGNOSIS — I2510 Atherosclerotic heart disease of native coronary artery without angina pectoris: Secondary | ICD-10-CM

## 2023-12-08 DIAGNOSIS — R82998 Other abnormal findings in urine: Secondary | ICD-10-CM | POA: Diagnosis not present

## 2023-12-08 LAB — POCT INFLUENZA A/B
Influenza A, POC: NEGATIVE
Influenza B, POC: NEGATIVE

## 2023-12-08 LAB — POC COVID19 BINAXNOW: SARS Coronavirus 2 Ag: NEGATIVE

## 2023-12-08 NOTE — Patient Instructions (Addendum)
Health Maintenance Due  Topic Date Due   Medicare Annual Wellness (AWV)  06/16/2019  You are eligible to schedule your annual wellness visit with our nurse specialist Inetta Fermo.  Please consider scheduling this before you leave today  Thrilled swelling is better- continue current medications - blood pressure high normal but we don't want you to swell like that!   We will update bloodwork next time  Recommended follow up: Return for next already scheduled visit or sooner if needed.

## 2023-12-08 NOTE — Progress Notes (Signed)
Phone [phone] In person visit   Subjective:   [name] is a 82 y.o. year old very pleasant female patient who presents for/with See problem oriented charting Chief Complaint  Patient presents with   Leg Swelling    Pt states she is no longer having leg swelling.   runny nose    Pt c/o runny nose that started yesterday, denies fever/other symptoms.   Past Medical History-  Patient Active Problem List   Diagnosis Date Noted   Advanced directives, counseling/discussion 01/26/2017    Priority: High   nonobstructive CAD (coronary artery disease) 12/01/2015    Priority: High   Memory loss 12/01/2015    Priority: High   Itchy skin 05/30/2020    Priority: Medium    History of adenomatous polyp of colon 08/01/2017    Priority: Medium    Varicose veins of both lower extremities 12/01/2015    Priority: Medium    Hyperlipidemia, unspecified 05/19/2007    Priority: Medium    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HCC) 05/19/2007    Priority: Medium    Essential hypertension 05/19/2007    Priority: Medium    Insomnia 12/01/2015    Priority: Low   Glaucoma 12/01/2015    Priority: Low   Hammer toe     Priority: Low   Arthritis 08/29/2012    Priority: Low   GERD 09/06/2009    Priority: Low   Edema 09/06/2009    Priority: Low   History of syncope 09/06/2009    Priority: Low   Hyperglycemia 05/19/2007    Priority: Low   Trigger finger, right middle finger 09/05/2017    Priority: 1.   Primary osteoarthritis of both knees 03/10/2017    Priority: 1.   Primary osteoarthritis of both hands 03/09/2017    Priority: 1.   Primary osteoarthritis of both feet 03/09/2017    Priority: 1.   History of skin cancer 11/10/2023   Diaphoresis 05/12/2018   Rheumatoid factor positive 03/09/2017    Medications- reviewed and updated Current Outpatient Medications  Medication Sig Dispense Refill   amLODipine (NORVASC) 5 MG tablet Take 1 tablet (5 mg total) by mouth daily. 90  tablet 3   aspirin 81 MG tablet Take 81 mg by mouth daily.      atorvastatin (LIPITOR) 80 MG tablet Take 1 tablet (80 mg total) by mouth every evening. 90 tablet 3   Cholecalciferol (VITAMIN D3) 125 MCG (5000 UT) CAPS Take 5,000 Units by mouth daily.     diclofenac sodium (VOLTAREN) 1 % GEL Apply 3 gm to 3 large joints up to 3 times a day.Dispense 3 tubes with 3 refills. (Patient taking differently: Apply 3 g topically 3 (three) times daily as needed (joint pain). Apply 3 gm to 3 large joints up to 3 times a day.Dispense 3 tubes with 3 refills.) 3 Tube 3   escitalopram (LEXAPRO) 20 MG tablet Take 1 tablet (20 mg total) by mouth daily. 90 tablet 3   ezetimibe (ZETIA) 10 MG tablet Take 1 tablet (10 mg total) by mouth daily. 90 tablet 3   ferrous sulfate 325 (65 FE) MG EC tablet Take 1 tablet (325 mg total) by mouth daily with breakfast. 90 tablet 0   furosemide (LASIX) 20 MG tablet Take 1 tablet (20 mg total) by mouth daily. 30 tablet 0   isosorbide mononitrate (IMDUR) 60 MG 24 hr tablet Take 1 tablet (60 mg total) by mouth daily. 90 tablet 3   nitroGLYCERIN (NITROSTAT) 0.4 MG SL  tablet Place 1 tablet (0.4 mg total) under the tongue every 5 (five) minutes as needed for chest pain. 25 tablet 3   omeprazole (PRILOSEC) 40 MG capsule Take 1 capsule (40 mg total) by mouth 2 (two) times daily. 60 capsule 5   triamcinolone cream (KENALOG) 0.1 % Apply 1 Application topically 2 (two) times daily. For 7-10 days maximum (Patient taking differently: Apply 1 Application topically 2 (two) times daily as needed (irritation). For 7-10 days maximum) 80 g 0   vitamin B-12 (CYANOCOBALAMIN) 1000 MCG tablet Take 1,000 mcg by mouth daily.     No current facility-administered medications for this visit.     Objective:  BP 138/70   Pulse 66   Temp (!) 97.4 F (36.3 C)   Wt 183 lb 6.4 oz (83.2 kg)   SpO2 97%   BMI 32.49 kg/m  Gen: NAD, resting comfortably Oropharynx close normal other than some drainage in pharynx,  nasal turbinates mildly edematous, tympanic membranes normal bilaterally CV: RRR no murmurs rubs or gallops Lungs: CTAB no crackles, wheeze, rhonchi Ext: trace edema Skin: warm, dry   Results for orders placed or performed in visit on 12/08/23 (from the past 24 hours)  POCT Influenza A/B     Status: None   Collection Time: 12/08/23 11:38 AM  Result Value Ref Range   Influenza A, POC Negative Negative   Influenza B, POC Negative Negative  POC COVID-19     Status: None   Collection Time: 12/08/23 11:39 AM  Result Value Ref Range   SARS Coronavirus 2 Ag Negative Negative       Assessment and Plan   # Runny nose S:runny nose starting yesterday without fever/chills/shortness of breath  A/P: Flu and COVID testing negative.  Likely URI-follow-up if new or worsening symptoms or persistent symptoms past 10 days  #Nonobstructive CAD - cath 08/01/23 S: Medication:Atorvastatin 80 mg, aspirin 81 mg, imdur 60 mg antianginal -zetia added June 2024 - no chest pain or shortness of breath despite reducing amlodipine-has been increased for potential antianginal effect A/P: Nonobstructive CAD remains asymptomatic while on Imdur-continue current medicine including reduced dose of amlodipine   #hypertension S: medication: Amlodipine 2.5 mg--> 10 mg--> 5 mg, Imdur 60 mg -prior metoprolol 25 mg extended release weaned off due to lightheadedness Home readings #s: 130s mainly- up to 140, bottom controlled -edema did go down with decreasing back to 5 mg A/P: Blood pressure high normal but edema much improved-continue current medication   # Asymmetrical hearing loss noted at [facility] and referred to ENT-thankfully MRI reassuring-sees Dr. Allena Katz   # Leukocytes in urine-possible bad sample last visit-ordered future labs-wants to bring by urine at the beginning of next visit-has cup at home  Recommended follow up: Return for next already scheduled visit or sooner if needed. Future Appointments  Date Time  Provider Department Center  01/13/2024  9:20 AM Shelva Majestic, MD [facility]  04/12/2024  3:20 PM Pollyann Savoy, MD [facility] None  11/27/2024  9:00 AM [address] [facility] None    Lab/Order associations:   ICD-10-CM   1. Essential hypertension  I10     2. Coronary artery disease involving native coronary artery of native heart without angina pectoris  I25.10     3. Runny nose  R09.89 POC COVID-19    POCT Influenza A/B    4. Leukocytes in urine  R82.998 Urinalysis, Routine w reflex microscopic      No orders of the defined types were placed in this  encounter.   Return precautions advised.  Tana Conch, MD

## 2023-12-25 ENCOUNTER — Other Ambulatory Visit: Payer: Self-pay | Admitting: Family Medicine

## 2024-01-13 ENCOUNTER — Ambulatory Visit (INDEPENDENT_AMBULATORY_CARE_PROVIDER_SITE_OTHER): Payer: Medicare HMO | Admitting: Family Medicine

## 2024-01-13 ENCOUNTER — Encounter: Payer: Self-pay | Admitting: Family Medicine

## 2024-01-13 VITALS — BP 124/72 | HR 64 | Temp 98.0°F | Ht 63.0 in | Wt 185.0 lb

## 2024-01-13 DIAGNOSIS — E669 Obesity, unspecified: Secondary | ICD-10-CM

## 2024-01-13 DIAGNOSIS — I10 Essential (primary) hypertension: Secondary | ICD-10-CM | POA: Diagnosis not present

## 2024-01-13 DIAGNOSIS — E7800 Pure hypercholesterolemia, unspecified: Secondary | ICD-10-CM | POA: Diagnosis not present

## 2024-01-13 DIAGNOSIS — I25118 Atherosclerotic heart disease of native coronary artery with other forms of angina pectoris: Secondary | ICD-10-CM | POA: Diagnosis not present

## 2024-01-13 DIAGNOSIS — R82998 Other abnormal findings in urine: Secondary | ICD-10-CM

## 2024-01-13 DIAGNOSIS — R7989 Other specified abnormal findings of blood chemistry: Secondary | ICD-10-CM

## 2024-01-13 DIAGNOSIS — Z131 Encounter for screening for diabetes mellitus: Secondary | ICD-10-CM | POA: Diagnosis not present

## 2024-01-13 LAB — URINALYSIS, ROUTINE W REFLEX MICROSCOPIC
Bilirubin Urine: NEGATIVE
Hgb urine dipstick: NEGATIVE
Ketones, ur: NEGATIVE
Leukocytes,Ua: NEGATIVE
Nitrite: NEGATIVE
Specific Gravity, Urine: 1.015 (ref 1.000–1.030)
Total Protein, Urine: NEGATIVE
Urine Glucose: NEGATIVE
Urobilinogen, UA: 0.2 (ref 0.0–1.0)
pH: 7 (ref 5.0–8.0)

## 2024-01-13 LAB — HEMOGLOBIN A1C: Hgb A1c MFr Bld: 6.1 % (ref 4.6–6.5)

## 2024-01-13 LAB — TSH: TSH: 4.04 u[IU]/mL (ref 0.35–5.50)

## 2024-01-13 NOTE — Patient Instructions (Addendum)
Health Maintenance Due  Topic Date Due   Medicare Annual Wellness (AWV)  06/16/2019  You are eligible to schedule your annual wellness visit with our nurse specialist Inetta Fermo.  Please consider scheduling this before you leave today  Please stop by lab before you go If you have mychart- we will send your results within 3 business days of [place] receiving them.  If you do not have mychart- we will call you about results within 5 business days of [place] receiving them.  *please also note that you will see labs on mychart as soon as they post. I will later go in and write notes on them- will say "notes from Dr. Durene Cal"   Recommended follow up: Return in about 6 months (around 07/12/2024) for physical or sooner if needed.Schedule b4 you leave.

## 2024-01-13 NOTE — Progress Notes (Signed)
Phone [phone] In person visit   Subjective:   [name] is a 83 y.o. year old very pleasant female patient who presents for/with See problem oriented charting Chief Complaint  Patient presents with   Medical Management of Chronic Issues    States you told her to leave a urine on last result note but you did not specify that in the result note.    Hypertension   Past Medical History-  Patient Active Problem List   Diagnosis Date Noted   Advanced directives, counseling/discussion 01/26/2017    Priority: High   nonobstructive CAD (coronary artery disease) 12/01/2015    Priority: High   Memory loss 12/01/2015    Priority: High   Itchy skin 05/30/2020    Priority: Medium    History of adenomatous polyp of colon 08/01/2017    Priority: Medium    Varicose veins of both lower extremities 12/01/2015    Priority: Medium    Hyperlipidemia, unspecified 05/19/2007    Priority: Medium    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HCC) 05/19/2007    Priority: Medium    Essential hypertension 05/19/2007    Priority: Medium    Insomnia 12/01/2015    Priority: Low   Glaucoma 12/01/2015    Priority: Low   Hammer toe     Priority: Low   Arthritis 08/29/2012    Priority: Low   GERD 09/06/2009    Priority: Low   Edema 09/06/2009    Priority: Low   History of syncope 09/06/2009    Priority: Low   Hyperglycemia 05/19/2007    Priority: Low   Trigger finger, right middle finger 09/05/2017    Priority: 1.   Primary osteoarthritis of both knees 03/10/2017    Priority: 1.   Primary osteoarthritis of both hands 03/09/2017    Priority: 1.   Primary osteoarthritis of both feet 03/09/2017    Priority: 1.   History of skin cancer 11/10/2023   Diaphoresis 05/12/2018   Rheumatoid factor positive 03/09/2017    Medications- reviewed and updated Current Outpatient Medications  Medication Sig Dispense Refill   amLODipine (NORVASC) 5 MG tablet Take 1 tablet (5 mg total) by mouth  daily. 90 tablet 3   aspirin 81 MG tablet Take 81 mg by mouth daily.      atorvastatin (LIPITOR) 80 MG tablet TAKE ONE TABLET BY MOUTH DAILY IN THE EVENING 90 tablet 1   Cholecalciferol (VITAMIN D3) 125 MCG (5000 UT) CAPS Take 5,000 Units by mouth daily.     diclofenac sodium (VOLTAREN) 1 % GEL Apply 3 gm to 3 large joints up to 3 times a day.Dispense 3 tubes with 3 refills. (Patient taking differently: Apply 3 g topically 3 (three) times daily as needed (joint pain). Apply 3 gm to 3 large joints up to 3 times a day.Dispense 3 tubes with 3 refills.) 3 Tube 3   escitalopram (LEXAPRO) 20 MG tablet Take 1 tablet (20 mg total) by mouth daily. 90 tablet 3   ezetimibe (ZETIA) 10 MG tablet Take 1 tablet (10 mg total) by mouth daily. 90 tablet 3   ferrous sulfate 325 (65 FE) MG EC tablet Take 1 tablet (325 mg total) by mouth daily with breakfast. 90 tablet 0   nitroGLYCERIN (NITROSTAT) 0.4 MG SL tablet Place 1 tablet (0.4 mg total) under the tongue every 5 (five) minutes as needed for chest pain. 25 tablet 3   omeprazole (PRILOSEC) 40 MG capsule Take 1 capsule (40 mg total) by mouth 2 (two)  times daily. 60 capsule 5   triamcinolone cream (KENALOG) 0.1 % Apply 1 Application topically 2 (two) times daily. For 7-10 days maximum (Patient taking differently: Apply 1 Application topically 2 (two) times daily as needed (irritation). For 7-10 days maximum) 80 g 0   vitamin B-12 (CYANOCOBALAMIN) 1000 MCG tablet Take 1,000 mcg by mouth daily.     isosorbide mononitrate (IMDUR) 60 MG 24 hr tablet Take 1 tablet (60 mg total) by mouth daily. 90 tablet 3   No current facility-administered medications for this visit.     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-recheck TSH today Lab Results  Component Value Date   TSH 6.45 (H) 11/10/2023   # Urine sample to look for proteinuria-negative at last result but was not the best sample so was recollected today- once again there was no protein thankfully  #Nonobstructive CAD - cath 08/01/23 #carotid artery stenosis 08/04/23 mild #hyperlipidemia S: Medication:Atorvastatin 80 mg, aspirin 81 mg, imdur 60 mg antianginal -zetia added June 2024 -no chest pain or shortness of breath   Lab Results  Component Value Date   CHOL 128 11/10/2023   HDL 48.70 11/10/2023   LDLCALC 59 11/10/2023   LDLDIRECT 65.0 05/30/2020   TRIG 101.0 11/10/2023   CHOLHDL 3 11/10/2023  A/P: coronary artery disease asymptomatic continue current medications  Lipids have been at goal  in November- continue current medications - also at goal for carotid artery stenosis which is mild -break in her exercise over holidays- planning to restart at the [facility]  #hypertension S: medication: Amlodipine 2.5 mg--> 10 mg--> 5 mg due to edema and has required Lasix 20 mg in the past, Imdur 60 mg. No lightheadedness A/P: stable- continue current medicines    # Depression S: Medication:Lexapro 20 mg    11/10/2023    8:37 AM 09/27/2023   10:27 AM 06/01/2023   10:17 AM  Depression screen PHQ 2/9  Decreased Interest 0 0 2  Down, Depressed, Hopeless 0 0 3  PHQ - 2 Score 0 0 5  Altered sleeping 3 2 2   Tired, decreased energy 1 0 2  Change in appetite 0 0 0  Feeling bad or failure about yourself  0 0 0  Trouble concentrating 0 0 1  Moving slowly or fidgety/restless 0 0 0  Suicidal thoughts 0 0 0  PHQ-9 Score 4 2 10   Difficult doing work/chores Somewhat difficult Not difficult at all Not difficult at all  A/P: full remission- continue current medications    # GERD S:Medication: Omeprazole 20 mg--> 40 mg 11/10/22 and on twice a day.  A/P: sees gastrointestinal and careful about foods she  eats- doing well- continue current medications    # Hyperglycemia/insulin -resistance/prediabetes- a1c up to 5.9 S:  Medication: none Exercise and diet- exercise down lately Lab Results  Component Value Date   HGBA1C 5.9 06/01/2023   HGBA1C 5.5 11/10/2022   HGBA1C 5.9 05/14/2022  A/P: hopefully stable- update a1c today. Continue without meds for now . Planning to restart exercise after holidays. Nice steady weight loss over time  #Anemia- arteriovenous  malformation(s) (AVM) related in 2024- has seen hematology Lab Results  Component Value Date   WBC 6.9 11/10/2023   HGB 12.3 11/10/2023   HCT 36.4 11/10/2023   MCV 86.4 11/10/2023   PLT 284.0 11/10/2023   #Now with [place] dermatology Dr. Doreen Beam - sees in 2 months   Recommended follow up: Return in about 6 months (around 07/12/2024) for physical or sooner if needed.Schedule b4 you leave. Future Appointments  Date Time Provider Department Center  04/12/2024  3:20 PM Pollyann Savoy, MD [facility] None   Lab/Order associations:   ICD-10-CM   1. Essential hypertension  I10     2. Pure hypercholesterolemia  E78.00 TSH    3. Coronary artery disease of native artery of native heart with stable angina pectoris (HCC)  I25.118     4. Leukocytes in urine  R82.998 Urinalysis, Routine w reflex microscopic    5. Screening for diabetes mellitus  Z13.1 Hemoglobin A1c    6. Obesity (BMI 30-39.9)  E66.9 Hemoglobin A1c    7. Elevated TSH  R79.89 TSH      No orders of the defined types were placed in this encounter.   Return precautions advised.  Tana Conch, MD

## 2024-01-24 ENCOUNTER — Other Ambulatory Visit: Payer: Self-pay | Admitting: Family Medicine

## 2024-01-24 DIAGNOSIS — Z1231 Encounter for screening mammogram for malignant neoplasm of breast: Secondary | ICD-10-CM

## 2024-02-08 ENCOUNTER — Ambulatory Visit
Admission: RE | Admit: 2024-02-08 | Discharge: 2024-02-08 | Disposition: A | Discharge status: Home / Self Care | Payer: Medicare HMO | Source: Ambulatory Visit | Attending: Family Medicine | Admitting: Family Medicine

## 2024-02-08 DIAGNOSIS — Z85828 Personal history of other malignant neoplasm of skin: Secondary | ICD-10-CM | POA: Diagnosis not present

## 2024-02-08 DIAGNOSIS — L820 Inflamed seborrheic keratosis: Secondary | ICD-10-CM | POA: Diagnosis not present

## 2024-02-08 DIAGNOSIS — L821 Other seborrheic keratosis: Secondary | ICD-10-CM | POA: Diagnosis not present

## 2024-02-08 DIAGNOSIS — L570 Actinic keratosis: Secondary | ICD-10-CM | POA: Diagnosis not present

## 2024-02-08 DIAGNOSIS — L814 Other melanin hyperpigmentation: Secondary | ICD-10-CM | POA: Diagnosis not present

## 2024-02-08 DIAGNOSIS — Z1231 Encounter for screening mammogram for malignant neoplasm of breast: Secondary | ICD-10-CM

## 2024-02-08 DIAGNOSIS — D225 Melanocytic nevi of trunk: Secondary | ICD-10-CM | POA: Diagnosis not present

## 2024-02-08 DIAGNOSIS — D0461 Carcinoma in situ of skin of right upper limb, including shoulder: Secondary | ICD-10-CM | POA: Diagnosis not present

## 2024-02-08 DIAGNOSIS — D485 Neoplasm of uncertain behavior of skin: Secondary | ICD-10-CM | POA: Diagnosis not present

## 2024-02-10 ENCOUNTER — Ambulatory Visit: Payer: Medicare HMO

## 2024-02-12 ENCOUNTER — Other Ambulatory Visit: Payer: Self-pay | Admitting: Family Medicine

## 2024-03-05 NOTE — Progress Notes (Unsigned)
 Office Visit Note  Patient: [name]             Date of Birth: [birth year]           MRN: [id number]             PCP: Shelva Majestic, MD Referring: Shelva Majestic, MD Visit Date: 03/08/2024 Occupation: @GUAROCC @  Subjective:  Pain in both hands  History of Present Illness: [name] is a 83 y.o. female with osteoarthritis and positive rheumatoid factor.  She returns today after her last visit in April 2024.  She states she had very good response to bilateral CMC injections in June 2023.  The symptoms resolved for a while.  Now the symptoms have returned back with pain and discomfort in her bilateral CMC's.  She states has been using Voltaren gel, CMC braces and also doing hand exercises.  She states has been going to the gym on a regular basis.  None of the other joints that is painful.  She does have some underlying osteoarthritis in her knees and her feet which is manageable.    Activities of Daily Living:  Patient reports morning stiffness for a few minutes.   Patient Denies nocturnal pain.  Difficulty dressing/grooming: Denies Difficulty climbing stairs: Denies Difficulty getting out of chair: Denies Difficulty using hands for taps, buttons, cutlery, and/or writing: Denies  Review of Systems  Constitutional:  Negative for fatigue.  HENT:  Negative for mouth sores and mouth dryness.   Eyes:  Negative for dryness.  Respiratory:  Negative for shortness of breath.   Cardiovascular:  Negative for chest pain and palpitations.  Gastrointestinal:  Negative for blood in stool, constipation and diarrhea.  Endocrine: Negative for increased urination.  Genitourinary:  Negative for involuntary urination.  Musculoskeletal:  Positive for joint pain, joint pain, joint swelling and morning stiffness. Negative for gait problem, myalgias, muscle weakness, muscle tenderness and myalgias.  Skin:  Negative for color change, rash, hair loss and sensitivity to sunlight.   Allergic/Immunologic: Negative for susceptible to infections.  Neurological:  Negative for dizziness and headaches.  Hematological:  Negative for swollen glands.  Psychiatric/Behavioral:  Positive for sleep disturbance. Negative for depressed mood. The patient is not nervous/anxious.     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 Corliss Skains   CAD (coronary artery disease)    nonobstructive   Cataract 03/30/07   10/16/09  Depression    Diabetes mellitus without complication (HCC)    Exertional dyspnea 08/29/2012   GERD (gastroesophageal reflux disease)    Hammer toe    surgery x2, second with Dr. Lajoyce Corners- only other option amputatoin   HLD (hyperlipidemia)    HTN (hypertension)    PNEUMONIA 07/04/2007   Qualifier: Diagnosis of  By: Drue Novel MD, Nolon Rod.    Syncope and collapse 08/29/2012   "first time ever"    Family  History  Problem Relation Age of Onset   Heart disease Mother        brother, father   Diabetes Mother        sister, brother   Vision loss Mother    Suicidality Father        71   Depression Father    Heart disease Father    Vision loss Father    Coronary artery disease Brother    Colon cancer Brother    Stroke Brother    ADD / ADHD Brother    Anxiety disorder Brother    Cancer Brother    Depression Brother    Diabetes Brother    Alcoholism Brother    Alcohol abuse Brother    Depression Brother    Diabetes Brother    Vision loss Brother    Ovarian cancer Daughter    Arthritis Sister    Diabetes Sister    Hearing loss Sister    Diabetes Sister    Esophageal cancer Neg Hx    Rectal cancer Neg Hx    Stomach cancer Neg Hx    Liver cancer Neg Hx    Past Surgical History:  Procedure Laterality Date   ABDOMINAL HYSTERECTOMY  08/27/1969   APPENDECTOMY  ~ 1970   CATARACT EXTRACTION W/ INTRAOCULAR LENS  IMPLANT, BILATERAL  ~ March 16, 2009   bilateral   EYE SURGERY  03/30/07   10/16/09   LEFT HEART CATH AND CORONARY ANGIOGRAPHY N/A 08/01/2023   Procedure: LEFT HEART CATH AND CORONARY ANGIOGRAPHY;  Surgeon: Kathleene Hazel, MD;  Location: [facility];  Service: Cardiovascular;  Laterality: N/A;   TONSILLECTOMY AND ADENOIDECTOMY  08/27/1949   VEIN LIGATION AND STRIPPING  12/27/2010   bilaterally   Social History   Social History Narrative   Widowed 10-16-2013. Daughter died 2012/03/16. 2 living children. 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      Hobbies: gardening, time with dog, time with grandkids   Immunization History  Administered Date(s) Administered   Fluad Quad(high Dose 65+) 09/25/2019, 10/07/2020, 09/17/2022   Fluad Trivalent(High Dose 65+) 09/27/2023   Influenza, High Dose Seasonal PF 09/10/2014, 10/10/2015, 08/24/2016,  09/28/2018, 09/17/2022   Influenza-Unspecified 11/12/2015, 09/25/2019, 09/03/2021   PFIZER Comirnaty(Gray Top)Covid-19 Tri-Sucrose Vaccine 02/17/2021, 09/22/2022   PFIZER(Purple Top)SARS-COV-2 Vaccination 01/21/2020, 02/11/2020, 08/19/2020, 11/13/2021   PNEUMOCOCCAL CONJUGATE-20 09/22/2022   PPD Test 05/06/2021   Pfizer Covid-19 Vaccine Bivalent Booster 63yrs & up 11/13/2021   Pfizer(Comirnaty)Fall Seasonal Vaccine 12 years and older 09/27/2022   Pneumococcal Conjugate-13 08/24/2016   Pneumococcal Polysaccharide-23 12/29/1999   Respiratory Syncytial Virus Vaccine,Recomb Aduvanted(Arexvy) 09/17/2022   Rsv, Bivalent, Protein Subunit Rsvpref,pf Verdis Frederickson) 09/17/2022   Td 12/28/2005, 10/26/2016   Tdap 09/22/2011   Zoster Recombinant(Shingrix) 12/01/2021, 02/12/2022     Objective: Vital Signs: BP 121/70 (BP Location: Left Arm, Patient Position: Sitting,  Cuff Size: Normal)   Pulse 76   Resp 15   Ht 5\' 3"  (1.6 m)   Wt 186 lb 6.4 oz (84.6 kg)   BMI 33.02 kg/m    Physical Exam Vitals and nursing note reviewed.  Constitutional:      Appearance: She is well-developed.  HENT:     Head: Normocephalic and atraumatic.  Eyes:     Conjunctiva/sclera: Conjunctivae normal.  Cardiovascular:     Rate and Rhythm: Normal rate and regular rhythm.     Heart sounds: Normal heart sounds.  Pulmonary:     Effort: Pulmonary effort is normal.     Breath sounds: Normal breath sounds.  Abdominal:     General: Bowel sounds are normal.     Palpations: Abdomen is soft.  Musculoskeletal:     Cervical back: Normal range of motion.  Lymphadenopathy:     Cervical: No cervical adenopathy.  Skin:    General: Skin is warm and dry.     Capillary Refill: Capillary refill takes less than 2 seconds.  Neurological:     Mental Status: She is alert and oriented to person, place, and time.  Psychiatric:        Behavior: Behavior normal.      Musculoskeletal Exam: She had limited lateral rotation of the cervical  spine without discomfort.  Thoracic kyphosis was noted.  There was no tenderness over thoracic or lumbar spine.  Shoulders, elbows, wrists were in good range of motion.  She had bilateral CMC, PIP and DIP thickening.  Subluxation of bilateral CMC joints with no synovitis was noted.  There was subluxation of several of the DIP joints.  Hip joints and knee joints in good range of motion.  There was no tenderness over ankles or MTPs.  CDAI Exam: CDAI Score: -- Patient Global: --; Provider Global: -- Swollen: --; Tender: -- Joint Exam 03/08/2024   No joint exam has been documented for this visit   There is currently no information documented on the homunculus. Go to the Rheumatology activity and complete the homunculus joint exam.  Investigation: No additional findings.  Imaging: US Guided Needle Placement Result Date: 03/08/2024 Ultrasound guided injection is preferred based studies that show increased duration, increased effect, greater accuracy, decreased procedural pain, increased response rate, and decreased cost with ultrasound guided versus blind injection.   Verbal informed consent obtained.  Time-out conducted.  Noted no overlying erythema, induration, or other signs of local infection. Ultrasound-guided CMC injection: After sterile prep with Betadine, injected 0.5 mL of 1% lidocaine and 20 mg Kenalog using a 27-gauge needle, in the [facility] joint.    US Guided Needle Placement Result Date: 03/08/2024 Ultrasound guided injection is preferred based studies that show increased duration, increased effect, greater accuracy, decreased procedural pain, increased response rate, and decreased cost with ultrasound guided versus blind injection.   Verbal informed consent obtained.  Time-out conducted.  Noted no overlying erythema, induration, or other signs of local infection. Ultrasound-guided CMC injection: After sterile prep with Betadine, injected 0.5 mL of 1% lidocaine and 20 mg Kenalog using a  27-gauge needle, in the [facility] joint.    MM 3D SCREENING MAMMOGRAM BILATERAL BREAST Result Date: 02/10/2024 CLINICAL DATA:  Screening. EXAM: DIGITAL SCREENING BILATERAL MAMMOGRAM WITH TOMOSYNTHESIS AND CAD TECHNIQUE: Bilateral screening digital craniocaudal and mediolateral oblique mammograms were obtained. Bilateral screening digital breast tomosynthesis was performed. The images were evaluated with computer-aided detection. COMPARISON:  Previous exam(s). ACR Breast Density Category b: There are scattered areas of fibroglandular  density. FINDINGS: There are no findings suspicious for malignancy. IMPRESSION: No mammographic evidence of malignancy. A result letter of this screening mammogram will be mailed directly to the patient. RECOMMENDATION: Screening mammogram in one year. (Code:SM-B-01Y) BI-RADS CATEGORY  1: Negative. Electronically Signed   By: Ted Mcalpine M.D.   On: 02/10/2024 14:37    Recent Labs: Lab Results  Component Value Date   WBC 6.9 11/10/2023   HGB 12.3 11/10/2023   PLT 284.0 11/10/2023   NA 141 11/10/2023   K 3.8 11/10/2023   CL 106 11/10/2023   CO2 27 11/10/2023   GLUCOSE 95 11/10/2023   BUN 15 11/10/2023   CREATININE 0.75 11/10/2023   BILITOT 0.8 11/10/2023   ALKPHOS 77 11/10/2023   AST 24 11/10/2023   ALT 14 11/10/2023   PROT 7.0 11/10/2023   ALBUMIN 4.0 11/10/2023   CALCIUM 9.2 11/10/2023   GFRAA >60 04/21/2020    Speciality Comments: No specialty comments available.  Procedures:  Ultrasound guided injection is preferred based studies that show increased duration, increased effect, greater accuracy, decreased procedural pain, increased response rate, and decreased cost with ultrasound guided versus blind injection.   Verbal informed consent obtained.  Time-out conducted.  Noted no overlying erythema, induration, or other signs of local infection. Ultrasound-guided CMC injection: After sterile prep with Betadine, injected 0.5 mL of 1% lidocaine and 20 mg  Kenalog using a 27-gauge needle, in the [facility] joint.   Hand/UE Inj: bilateral thumb CMC for osteoarthritis on 03/08/2024 8:09 AM Indications: pain Details: 27 G needle, ultrasound-guided radial approach Medications (Right): 0.5 mL lidocaine 1 %; 20 mg triamcinolone acetonide 40 MG/ML Aspirate (Right): 0 mL Medications (Left): 0.5 mL lidocaine 1 %; 20 mg triamcinolone acetonide 40 MG/ML Aspirate (Left): 0 mL Procedure, treatment alternatives, risks and benefits explained, specific risks discussed. Immediately prior to procedure a time out was called to verify the correct patient, procedure, equipment, support staff and site/side marked as required. Patient was prepped and draped in the usual sterile fashion.    Postprocedure instructions were given. Allergies: Clindamycin/lincomycin and Trazodone and nefazodone   Assessment / Plan:     Visit Diagnoses: Arthritis of carpometacarpal (CMC) joint of both thumbs -patient states she has been having progressively increasing pain in her bilateral CMC joints.  She had cortisone injections on June 08, 2022 and had good response to the injections.  She is requesting to have repeat cortisone injections today.  Side effects of cortisone injection including increased risk of infection, tendon injury, dermal atrophy and skin discoloration were discussed.  She states she has been using Voltaren gel and CMC brace.  Primary osteoarthritis of both hands-she has noticed decreased grip strength in her hands due to severe osteoarthritis.  She has difficulty holding objects.  Hand strengthening exercises were discussed.  Primary osteoarthritis of both knees-she has off-and-on discomfort in her knee joints.  No warmth swelling or effusion was noted.  She has been going to the gym on a regular basis and exercising.:  Strength exercises were discussed.  Primary osteoarthritis of both feet-proper fitting shoes were advised.  Rheumatoid factor positive-no synovitis was noted  on the examination.  Primary insomnia-sleep hygiene was discussed.  History of hyperlipidemia  History of hypertension- blood pressure was normal today at 121/70.  History of gastroesophageal reflux (GERD)  History of coronary artery disease  History of adenomatous polyp of colon  History of depression  Orders: Orders Placed This Encounter  Procedures   Hand/UE Inj   US Guided  Needle Placement   US Guided Needle Placement   No orders of the defined types were placed in this encounter.    Follow-Up Instructions: Return in about 1 year (around 03/08/2025) for Osteoarthritis.   Pollyann Savoy, MD  Note - This record has been created using Animal nutritionist.  Chart creation errors have been sought, but may not always  have been located. Such creation errors do not reflect on  the standard of medical care.

## 2024-03-08 ENCOUNTER — Encounter: Payer: Self-pay | Admitting: Rheumatology

## 2024-03-08 ENCOUNTER — Ambulatory Visit: Attending: Rheumatology | Admitting: Rheumatology

## 2024-03-08 ENCOUNTER — Ambulatory Visit

## 2024-03-08 VITALS — BP 121/70 | HR 76 | Resp 15 | Ht 63.0 in | Wt 186.4 lb

## 2024-03-08 DIAGNOSIS — Z860101 Personal history of adenomatous and serrated colon polyps: Secondary | ICD-10-CM

## 2024-03-08 DIAGNOSIS — F5101 Primary insomnia: Secondary | ICD-10-CM

## 2024-03-08 DIAGNOSIS — M19042 Primary osteoarthritis, left hand: Secondary | ICD-10-CM

## 2024-03-08 DIAGNOSIS — M19041 Primary osteoarthritis, right hand: Secondary | ICD-10-CM

## 2024-03-08 DIAGNOSIS — R768 Other specified abnormal immunological findings in serum: Secondary | ICD-10-CM | POA: Diagnosis not present

## 2024-03-08 DIAGNOSIS — M19072 Primary osteoarthritis, left ankle and foot: Secondary | ICD-10-CM

## 2024-03-08 DIAGNOSIS — Z8659 Personal history of other mental and behavioral disorders: Secondary | ICD-10-CM

## 2024-03-08 DIAGNOSIS — M19071 Primary osteoarthritis, right ankle and foot: Secondary | ICD-10-CM | POA: Diagnosis not present

## 2024-03-08 DIAGNOSIS — Z8719 Personal history of other diseases of the digestive system: Secondary | ICD-10-CM

## 2024-03-08 DIAGNOSIS — Z8679 Personal history of other diseases of the circulatory system: Secondary | ICD-10-CM

## 2024-03-08 DIAGNOSIS — M180 Bilateral primary osteoarthritis of first carpometacarpal joints: Secondary | ICD-10-CM

## 2024-03-08 DIAGNOSIS — Z8639 Personal history of other endocrine, nutritional and metabolic disease: Secondary | ICD-10-CM | POA: Diagnosis not present

## 2024-03-08 DIAGNOSIS — M170 Bilateral primary osteoarthritis of knee: Secondary | ICD-10-CM

## 2024-03-08 DIAGNOSIS — D485 Neoplasm of uncertain behavior of skin: Secondary | ICD-10-CM | POA: Diagnosis not present

## 2024-03-08 DIAGNOSIS — L988 Other specified disorders of the skin and subcutaneous tissue: Secondary | ICD-10-CM | POA: Diagnosis not present

## 2024-03-08 MED ORDER — LIDOCAINE HCL 1 % IJ SOLN
0.5000 mL | INTRAMUSCULAR | Status: AC | PRN
  Administered 2024-03-08: .5 mL

## 2024-03-08 MED ORDER — TRIAMCINOLONE ACETONIDE 40 MG/ML IJ SUSP
20.0000 mg | INTRAMUSCULAR | Status: AC | PRN
  Administered 2024-03-08: 20 mg

## 2024-03-23 DIAGNOSIS — Z961 Presence of intraocular lens: Secondary | ICD-10-CM | POA: Diagnosis not present

## 2024-03-23 DIAGNOSIS — H5203 Hypermetropia, bilateral: Secondary | ICD-10-CM | POA: Diagnosis not present

## 2024-03-23 DIAGNOSIS — H353131 Nonexudative age-related macular degeneration, bilateral, early dry stage: Secondary | ICD-10-CM | POA: Diagnosis not present

## 2024-04-12 ENCOUNTER — Ambulatory Visit: Payer: Medicare HMO | Admitting: Rheumatology

## 2024-04-25 ENCOUNTER — Other Ambulatory Visit: Payer: Self-pay | Admitting: Cardiology

## 2024-04-25 ENCOUNTER — Other Ambulatory Visit: Payer: Self-pay | Admitting: Family Medicine

## 2024-04-25 DIAGNOSIS — E785 Hyperlipidemia, unspecified: Secondary | ICD-10-CM

## 2024-04-29 ENCOUNTER — Other Ambulatory Visit: Payer: Self-pay | Admitting: Cardiology

## 2024-04-29 DIAGNOSIS — E785 Hyperlipidemia, unspecified: Secondary | ICD-10-CM

## 2024-05-04 DIAGNOSIS — H524 Presbyopia: Secondary | ICD-10-CM | POA: Diagnosis not present

## 2024-05-04 DIAGNOSIS — H52223 Regular astigmatism, bilateral: Secondary | ICD-10-CM | POA: Diagnosis not present

## 2024-05-05 ENCOUNTER — Other Ambulatory Visit: Payer: Self-pay | Admitting: Family Medicine

## 2024-05-08 ENCOUNTER — Other Ambulatory Visit: Payer: Self-pay | Admitting: Cardiology

## 2024-05-08 DIAGNOSIS — E785 Hyperlipidemia, unspecified: Secondary | ICD-10-CM

## 2024-05-09 ENCOUNTER — Other Ambulatory Visit: Payer: Self-pay | Admitting: Cardiology

## 2024-05-09 DIAGNOSIS — E785 Hyperlipidemia, unspecified: Secondary | ICD-10-CM

## 2024-05-09 NOTE — Telephone Encounter (Signed)
 Medication is not prescribed by PCP and prescription was already sent by provider, Dr Audery Blazing yesterday. Unable to cancel reorder for pend and send medication, Epic states medication must be refused or signed.  Copied from CRM [id number]. Topic: Clinical - Medication Question >> May 09, 2024 10:33 AM [name] wrote: Reason for CRM: patient called to ensure medication refill request was received from the pharmacy for ezetimibe  (ZETIA ) 10 MG tablet [Pharmacy Med Name: Ezetimibe  Oral Tablet 10 MG].

## 2024-06-19 ENCOUNTER — Other Ambulatory Visit: Payer: Self-pay | Admitting: Physician Assistant

## 2024-06-19 DIAGNOSIS — K219 Gastro-esophageal reflux disease without esophagitis: Secondary | ICD-10-CM

## 2024-07-09 ENCOUNTER — Ambulatory Visit (INDEPENDENT_AMBULATORY_CARE_PROVIDER_SITE_OTHER): Admitting: Family Medicine

## 2024-07-09 ENCOUNTER — Ambulatory Visit: Payer: Self-pay | Admitting: Family Medicine

## 2024-07-09 ENCOUNTER — Ambulatory Visit (INDEPENDENT_AMBULATORY_CARE_PROVIDER_SITE_OTHER)

## 2024-07-09 ENCOUNTER — Ambulatory Visit: Payer: Self-pay

## 2024-07-09 ENCOUNTER — Encounter: Payer: Self-pay | Admitting: Family Medicine

## 2024-07-09 VITALS — BP 128/74 | HR 64 | Temp 97.2°F | Ht 63.0 in | Wt 185.0 lb

## 2024-07-09 DIAGNOSIS — R109 Unspecified abdominal pain: Secondary | ICD-10-CM

## 2024-07-09 DIAGNOSIS — N200 Calculus of kidney: Secondary | ICD-10-CM

## 2024-07-09 DIAGNOSIS — M545 Low back pain, unspecified: Secondary | ICD-10-CM | POA: Diagnosis not present

## 2024-07-09 DIAGNOSIS — R1032 Left lower quadrant pain: Secondary | ICD-10-CM

## 2024-07-09 DIAGNOSIS — M47816 Spondylosis without myelopathy or radiculopathy, lumbar region: Secondary | ICD-10-CM | POA: Diagnosis not present

## 2024-07-09 DIAGNOSIS — M48061 Spinal stenosis, lumbar region without neurogenic claudication: Secondary | ICD-10-CM | POA: Diagnosis not present

## 2024-07-09 LAB — POC URINALSYSI DIPSTICK (AUTOMATED)
Bilirubin, UA: NEGATIVE
Glucose, UA: NEGATIVE
Ketones, UA: NEGATIVE
Nitrite, UA: NEGATIVE
Protein, UA: NEGATIVE
Spec Grav, UA: >=1.03 — AB (ref 1.010–1.025)
Urobilinogen, UA: 0.2 U/dL
pH, UA: 6 (ref 5.0–8.0)

## 2024-07-09 MED ORDER — CEPHALEXIN 500 MG PO CAPS: 500.0000 mg | ORAL_CAPSULE | Freq: Two times a day (BID) | ORAL | 0 refills | Status: AC

## 2024-07-09 MED ORDER — ONDANSETRON HCL 4 MG PO TABS: 4.0000 mg | ORAL_TABLET | Freq: Three times a day (TID) | ORAL | 0 refills | Status: DC | PRN

## 2024-07-09 MED ORDER — OXYCODONE-ACETAMINOPHEN 5-325 MG PO TABS: 1.0000 | ORAL_TABLET | ORAL | 0 refills | Status: AC | PRN

## 2024-07-09 MED ORDER — TAMSULOSIN HCL 0.4 MG PO CAPS: 0.4000 mg | ORAL_CAPSULE | Freq: Every day | ORAL | 0 refills | Status: DC

## 2024-07-09 NOTE — Progress Notes (Signed)
 Spoke w/pt.  Had some pain and dry heaves.  Ok now.  Advised may have large kidney stone.  Doesn't want to go to ER but may need to.  For now, sips of fluids freq.  Will send in tamsulosin  and order ct stat for tomorrow.  DON'T HESITATE TO GO TO er

## 2024-07-09 NOTE — Telephone Encounter (Addendum)
 FYI Only or Action Required?: FYI only for provider.  Patient was last seen in primary care on 01/13/2024 by Katrinka Garnette KIDD, MD.  Called Nurse Triage reporting Pain.  Symptoms began x Friday.  Interventions attempted: Nothing.  Symptoms are: gradually worsening.  Triage Disposition: See PCP When Office is Open (Within 3 Days)  Patient/caregiver understands and will follow disposition?: Yes       Message from [facility] G sent at 07/09/2024  9:35 AM EDT  Nausea and Pain in Kidney.. low grade fever..   Reason for Disposition  Nausea lasts > 1 week  [1] MODERATE pain (e.g., interferes with normal activities) AND [2] present > 3 days  Answer Assessment - Initial Assessment Questions 1. ONSET: When did the muscle aches or body pains start?      xFriday 2. LOCATION: What part of your body is hurting? (e.g., entire body, arms, legs)      Kidney area pain 3. SEVERITY: How bad is the pain? (Scale 1-10; or mild, moderate, severe)     6 or 7/10  4. CAUSE: What do you think is causing the pains?     unknown 5. FEVER: Do you have a fever? If Yes, ask: What is your temperature, how was it measured, and  when did it start?      yes 6. OTHER SYMPTOMS: Do you have any other symptoms? (e.g., chest pain, cold or flu symptoms, rash, weakness, weight loss)     no 7. PREGNANCY: Is there any chance you are pregnant? When was your last menstrual period?     na 8. TRAVEL: Have you traveled out of the country in the last month? (e.g., exposures, travel history)     na  Answer Assessment - Initial Assessment Questions 1. NAUSEA SEVERITY: How bad is the nausea? (e.g., mild, moderate, severe; dehydration, weight loss)     moderate 2. ONSET: When did the nausea begin?     X Friday 3. VOMITING: Any vomiting? If Yes, ask: How many times today?     Yes, some 4. RECURRENT SYMPTOM: Have you had nausea before? If Yes, ask: When was the last time? What happened that  time?     no 5. CAUSE: What do you think is causing the nausea?     unknown 6. PREGNANCY: Is there any chance you are pregnant? (e.g., unprotected intercourse, missed birth control pill, broken condom)     na  Protocols used: Muscle Aches and Body Pain-A-AH, Nausea-A-AH

## 2024-07-09 NOTE — Patient Instructions (Addendum)
 Worse, ER  Watch for rash  Heat, motrin or aleve and can do tylenol  if still pain  If can't urinate, ER.    Sent antibiotics to [facility].

## 2024-07-09 NOTE — Telephone Encounter (Signed)
 Please schedule with available provider to be evaluated.

## 2024-07-09 NOTE — Progress Notes (Signed)
 Subjective:     Patient ID: [name], female    DOB: [birth year], 83 y.o.   MRN: [id number]  Chief Complaint  Patient presents with   Flank Pain    Left sided flank pain; nausea; vomiting; no fever; does not feel like she is completely emptying bladder; symptoms started last Friday;      HPI Discussed the use of AI scribe software for clinical note transcription with the patient, who gave verbal consent to proceed.  History of Present Illness [name] is an 83 year old female who presents with left flank pain and urinary symptoms.  She has been experiencing sharp, intermittent pain in the left flank area since Friday. The pain is severe at times, causing nausea and dry heaves, particularly on Friday afternoon. This morning, the pain was excruciating, preventing her from going to the gym.  She notes a decrease in urine stream, with urination occurring in small amounts. She denies any burning sensation during urination or seeing blood in her urine. No history of kidney stones or similar symptoms in the past.  She feels generally unwell and has been lying down frequently due to discomfort. She has not measured her temperature but does not feel feverish. She reports very loose stools, which is normal for her, and denies having diarrhea.  She recently increased her weightlifting from five to six pounds about a week and a half ago. She lives alone and has taken precautions such as keeping her phone nearby and ensuring her glass door is unlocked in case of an emergency.    There are no preventive care reminders to display for this patient.  Past Medical History:  Diagnosis Date   Anginal pain (HCC) 08/29/2012   have had problems w/this   Anxiety 08/29/2012   Arthritis 08/29/2012   right hand, Seen by Dr. Dolphus   CAD (coronary artery disease)    nonobstructive   Cataract 03/30/07   10/16/09   Depression    Diabetes mellitus without complication (HCC)     Exertional dyspnea 08/29/2012   GERD (gastroesophageal reflux disease)    Hammer toe    surgery x2, second with Dr. Harden- only other option amputatoin   HLD (hyperlipidemia)    HTN (hypertension)    PNEUMONIA 07/04/2007   Qualifier: Diagnosis of  By: Amon MD, Aloysius BRAVO.    Syncope and collapse 08/29/2012   first time ever    Past Surgical History:  Procedure Laterality Date   ABDOMINAL HYSTERECTOMY  08/27/1969   APPENDECTOMY  ~ 1970   CATARACT EXTRACTION W/ INTRAOCULAR LENS  IMPLANT, BILATERAL  ~ 2010   bilateral   EYE SURGERY  03/30/07   10/16/09   LEFT HEART CATH AND CORONARY ANGIOGRAPHY N/A 08/01/2023   Procedure: LEFT HEART CATH AND CORONARY ANGIOGRAPHY;  Surgeon: Verlin Lonni BIRCH, MD;  Location: [facility];  Service: Cardiovascular;  Laterality: N/A;   TONSILLECTOMY AND ADENOIDECTOMY  08/27/1949   VEIN LIGATION AND STRIPPING  12/27/2010   bilaterally     Current Outpatient Medications:    amLODipine  (NORVASC ) 5 MG tablet, Take 1 tablet (5 mg total) by mouth daily., Disp: 90 tablet, Rfl: 3   aspirin  81 MG tablet, Take 81 mg by mouth daily. , Disp: , Rfl:    atorvastatin  (LIPITOR ) 80 MG tablet, TAKE ONE TABLET BY MOUTH DAILY IN THE EVENING, Disp: 90 tablet, Rfl: 1   Cholecalciferol (VITAMIN D3) 125 MCG (5000 UT) CAPS, Take 5,000 Units by mouth daily.,  Disp: , Rfl:    diclofenac  sodium (VOLTAREN ) 1 % GEL, Apply 3 gm to 3 large joints up to 3 times a day.Dispense 3 tubes with 3 refills., Disp: 3 Tube, Rfl: 3   escitalopram  (LEXAPRO ) 20 MG tablet, TAKE ONE TABLET BY MOUTH ONCE A DAY, Disp: 90 tablet, Rfl: 0   ezetimibe  (ZETIA ) 10 MG tablet, Take 1 tablet (10 mg total) by mouth daily., Disp: 90 tablet, Rfl: 3   ferrous sulfate  325 (65 FE) MG EC tablet, Take 1 tablet (325 mg total) by mouth daily with breakfast., Disp: 90 tablet, Rfl: 0   nitroGLYCERIN  (NITROSTAT ) 0.4 MG SL tablet, Place 1 tablet (0.4 mg total) under the tongue every 5 (five) minutes as needed for chest  pain., Disp: 25 tablet, Rfl: 3   omeprazole  (PRILOSEC) 40 MG capsule, TAKE ONE CAPSULE BY MOUTH TWICE DAILY, Disp: 60 capsule, Rfl: 6   triamcinolone  cream (KENALOG ) 0.1 %, Apply 1 Application topically 2 (two) times daily. For 7-10 days maximum, Disp: 80 g, Rfl: 0   vitamin B-12 (CYANOCOBALAMIN ) 1000 MCG tablet, Take 1,000 mcg by mouth daily., Disp: , Rfl:    isosorbide  mononitrate (IMDUR ) 60 MG 24 hr tablet, Take 1 tablet (60 mg total) by mouth daily. (Patient not taking: Reported on 07/09/2024), Disp: 90 tablet, Rfl: 3  Allergies  Allergen Reactions   Clindamycin/Lincomycin     Severe rash and into throat   Trazodone  And Nefazodone Other (See Comments)    drowsiness   ROS neg/noncontributory except as noted HPI/below      Objective:     BP 128/74 (BP Location: Right Arm, Patient Position: Sitting, Cuff Size: Normal)   Pulse 64   Temp (!) 97.2 F (36.2 C) (Temporal)   Ht 5' 3 (1.6 m)   Wt 185 lb (83.9 kg)   SpO2 97%   BMI 32.77 kg/m  Wt Readings from Last 3 Encounters:  07/09/24 185 lb (83.9 kg)  03/08/24 186 lb 6.4 oz (84.6 kg)  01/13/24 185 lb (83.9 kg)    Physical Exam   Gen: WDWN NAD HEENT: NCAT, conjunctiva not injected, sclera nonicteric CARDIAC: RRR, S1S2+, no murmur.  LUNGS: CTAB. No wheezes ABDOMEN:  BS+, soft,mod tender LLQ, No HSM, no masses. No cvt EXT:  no edema MSK: no gross abnormalities.  +TTP L lower back/flank.  No rash NEURO: A&O x3.  CN II-XII intact.  PSYCH: normal mood. Good eye contact  Reviewd old labs Results for orders placed or performed in visit on 07/09/24  POCT Urinalysis Dipstick (Automated)   Collection Time: 07/09/24  2:05 PM  Result Value Ref Range   Color, UA Yellow    Clarity, UA Clear    Glucose, UA Negative Negative   Bilirubin, UA Negative    Ketones, UA Negative    Spec Grav, UA >=1.030 (A) 1.010 - 1.025   Blood, UA Trace    pH, UA 6.0 5.0 - 8.0   Protein, UA Negative Negative   Urobilinogen, UA 0.2 0.2 or 1.0  E.U./dL   Nitrite, UA Negative    Leukocytes, UA Trace (A) Negative       Assessment & Plan:  Abdominal wall pain in left flank -     POCT Urinalysis Dipstick (Automated) -     DG Lumbar Spine 2-3 Views; Future  Assessment and Plan Assessment & Plan Left Flank Pain   Intermittent sharp pain in the left flank, worsened by movement and accompanied by nausea, suggests possible muscular strain, kidney issues, diverticulitis,  or shingles. Tenderness on examination points to muscular involvement, possibly due to increased weightlifting. Pain severity is significant but emergency care has not been sought. Order urinalysis and x-ray to evaluate kidney issues. Use ibuprofen, Advil, Aleve, or Tylenol  for pain. Monitor for rash as a precaution for shingles. Seek emergency care if pain becomes severe.  Urinary Symptoms   Decreased urine stream and frequency without dysuria or visible hematuria may indicate kidney or urinary tract issues. Order urinalysis to check for blood or infection. Or even kidney stone or spinal nerve(no saddle anesth).   Will tx w/keflex  500mg  bid and check cx.   General Health Maintenance   She received a shingles vaccination. No new surgeries or significant health changes since the last visit.  Emergency Preparedness   Lives alone and has taken emergency precautions, such as keeping a phone nearby and ensuring easy access for emergency services. Concerned about symptom severity and prepared for potential emergencies. Keep phone close at all times and glass door unlocked for emergency access.    Return if symptoms worsen or fail to improve.  Jenkins CHRISTELLA Carrel, MD

## 2024-07-10 ENCOUNTER — Ambulatory Visit
Admission: RE | Admit: 2024-07-10 | Discharge: 2024-07-10 | Disposition: A | Discharge status: Home / Self Care | Source: Ambulatory Visit | Attending: Family Medicine | Admitting: Family Medicine

## 2024-07-10 ENCOUNTER — Ambulatory Visit: Payer: Self-pay | Admitting: Family Medicine

## 2024-07-10 DIAGNOSIS — I700 Atherosclerosis of aorta: Secondary | ICD-10-CM | POA: Diagnosis not present

## 2024-07-10 DIAGNOSIS — N132 Hydronephrosis with renal and ureteral calculous obstruction: Secondary | ICD-10-CM | POA: Diagnosis not present

## 2024-07-10 DIAGNOSIS — N139 Obstructive and reflux uropathy, unspecified: Secondary | ICD-10-CM

## 2024-07-10 DIAGNOSIS — R1032 Left lower quadrant pain: Secondary | ICD-10-CM

## 2024-07-10 DIAGNOSIS — N200 Calculus of kidney: Secondary | ICD-10-CM

## 2024-07-10 DIAGNOSIS — K449 Diaphragmatic hernia without obstruction or gangrene: Secondary | ICD-10-CM | POA: Diagnosis not present

## 2024-07-10 NOTE — Progress Notes (Signed)
 Large stone obstructing.  Do stat referral to urol(referral done)

## 2024-07-11 ENCOUNTER — Other Ambulatory Visit: Payer: Self-pay | Admitting: Urology

## 2024-07-11 DIAGNOSIS — N201 Calculus of ureter: Secondary | ICD-10-CM | POA: Diagnosis not present

## 2024-07-11 DIAGNOSIS — N393 Stress incontinence (female) (male): Secondary | ICD-10-CM | POA: Diagnosis not present

## 2024-07-11 DIAGNOSIS — R350 Frequency of micturition: Secondary | ICD-10-CM | POA: Diagnosis not present

## 2024-07-13 ENCOUNTER — Encounter (HOSPITAL_COMMUNITY): Payer: Self-pay | Admitting: Urology

## 2024-07-13 ENCOUNTER — Encounter: Payer: Medicare HMO | Admitting: Family Medicine

## 2024-07-13 NOTE — H&P (Signed)
 I was consulted to assess the patient's left ureteral stone. On Friday she had pain with dry heaves. No blood in urine. She had a CT scan which showed a 12 mm left ureteropelvic junction stone with mild hydronephrosis. She had a very small stone in the lower pole left kidney. Right kidney normal.   She has pain off-and-on since. She will take oxycodone  and Zofran  as needed. They put her on Keflex . They put her on Flomax .   At baseline she voids every 2 or 3 hours and gets up 2-3 times a night. She is continent unless she sneezes hard   She is on daily aspirin    No history of kidney stones bladder surgery or bladder infections. No neurologic issues     ALLERGIES: Trazodone  - Dizziness    MEDICATIONS: Aspirin   amLODIPine  Besylate 5 MG Tablet  Atorvastatin  Calcium  80 MG Tablet 1 tablet PO Daily  B12  Cephalexin  500 MG Capsule 1 capsule PO Daily  Diclofenac  Sodium 1 % Gel  Escitalopram  Oxalate 20 MG Tablet  Ezetimibe  10 MG Tablet 1 tablet PO Daily  Ferrous Sulfate  325 (65 Fe) MG Tablet  Isosorbide  Mononitrate ER 60 MG Tablet Extended Release 24 Hour 1 tablet PO Daily  Nitroglycerin  0.4 MG Tablet Sublingual 1 tablet PO Daily  Omeprazole  40 MG Capsule Delayed Release 1 capsule PO Daily  Ondansetron  HCl 4 MG Tablet 1 tablet PO Daily  oxyCODONE -Acetaminophen  5-325 MG Tablet 1 tablet PO Daily  Tamsulosin  HCl 0.4 MG Capsule 1 capsule PO Daily  Triamcinolone  Acetonide 0.1 % Cream  Vitamin D3     GU PSH: No GU PSH      PSH Notes: veins 2005    NON-GU PSH: No Non-GU PSH    GU PMH: None   NON-GU PMH: Arthritis GERD Heart disease, unspecified Hypercholesterolemia Hypertension    FAMILY HISTORY: 3 daughters - Daughter Blood in the urine - Brother Deceased - Father, Mother Diabetes - Brother, Mother, Sister heart - Mother Kidney Stones - Brother Prostate Cancer - Brother suicide - Father   SOCIAL HISTORY: Marital Status: Widowed Preferred Language: English; Ethnicity: Not  Hispanic Or Latino; Race: White Current Smoking Status: Patient does not smoke anymore. Has not smoked since 06/26/1974. Smoked 1 pack per day.   Tobacco Use Assessment Completed: Used Tobacco in last 30 days? Does not use smokeless tobacco. Has never drank.  Drinks 1 caffeinated drink per day. Patient's occupation is/was n/a.    REVIEW OF SYSTEMS:    GU Review Female:   Patient reports get up at night to urinate. Patient denies frequent urination, hard to postpone urination, burning /pain with urination, leakage of urine, stream starts and stops, trouble starting your stream, have to strain to urinate, and being pregnant.  Gastrointestinal (Upper):   Patient reports nausea, vomiting, and indigestion/ heartburn.   Gastrointestinal (Lower):   Patient denies diarrhea and constipation.  Constitutional:   Patient denies weight loss, fever, fatigue, and night sweats.  Skin:   Patient denies skin rash/ lesion and itching.  Eyes:   Patient denies blurred vision and double vision.  Ears/ Nose/ Throat:   Patient denies sore throat and sinus problems.  Hematologic/Lymphatic:   Patient denies swollen glands and easy bruising.  Cardiovascular:   Patient denies leg swelling and chest pains.  Respiratory:   Patient denies cough and shortness of breath.  Endocrine:   Patient denies excessive thirst.  Musculoskeletal:   Patient reports back pain. Patient denies joint pain.  Neurological:   Patient denies  headaches and dizziness.  Psychologic:   Patient denies depression and anxiety.   Notes: kidney stone, flank pain, weak stream     VITAL SIGNS:      07/11/2024 01:08 PM  Weight 185 lb / 83.91 kg  Height 65 in / 165.1 cm  BP 100/62 mmHg  Pulse 60 /min  Temperature 97.5 F / 36.3 C  BMI 30.8 kg/m   MULTI-SYSTEM PHYSICAL EXAMINATION:    Constitutional: Well-nourished. No physical deformities. Normally developed. Good grooming.  Neck: Neck symmetrical, not swollen. Normal tracheal position.   Respiratory: No labored breathing, no use of accessory muscles.   Cardiovascular: Normal temperature, normal extremity pulses, no swelling, no varicosities.  Lymphatic: No enlargement of neck, axillae, groin.  Skin: No paleness, no jaundice, no cyanosis. No lesion, no ulcer, no rash.  Neurologic / Psychiatric: Oriented to time, oriented to place, oriented to person. No depression, no anxiety, no agitation.  Gastrointestinal: No mass, no tenderness, no rigidity, non obese abdomen.  Eyes: Normal conjunctivae. Normal eyelids.  Ears, Nose, Mouth, and Throat: Left ear no scars, no lesions, no masses. Right ear no scars, no lesions, no masses. Nose no scars, no lesions, no masses. Normal hearing. Normal lips.  Musculoskeletal: Normal gait and station of head and neck.     PAST DATA REVIEW: None   PROCEDURES:         KUB - 25981      . Patient confirmed No Neulasta OnPro Device.          PVR Ultrasound - 48201  Scanned Volume: 15 cc         Urinalysis w/Scope Dipstick Dipstick Cont'd Micro  Color: Yellow Bilirubin: Neg mg/dL WBC/hpf: 6 - 89/yeq  Appearance: Clear Ketones: Neg mg/dL RBC/hpf: 3 - 89/yeq  Specific Gravity: 1.025 Blood: 2+ ery/uL Bacteria: Few (10-25/hpf)  pH: 6.0 Protein: 1+ mg/dL Cystals: NS (Not Seen)  Glucose: Neg mg/dL Urobilinogen: 2.0 mg/dL Casts: Hyaline    Nitrites: Neg Trichomonas: Not Present    Leukocyte Esterase: 1+ leu/uL Mucous: Not Present      Epithelial Cells: 0 - 5/hpf      Yeast: NS (Not Seen)      Sperm: Not Present    Notes: unspun sample due to QNS    ASSESSMENT:      ICD-10 Details  1 GU:   Urinary Frequency - R35.0   2   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 On Friday she had pain with dry heaves. No blood in urine. She had a CT scan which showed a 12 mm left ureteropelvic junction stone with mild hydronephrosis. She had a very small  stone in the lower pole left kidney. Right kidney normal.   She has pain off-and-on since. She will take oxycodone  and Zofran  as needed. They put her on Keflex . They put her on Flomax .   At baseline she voids every 2 or 3 hours and gets up 2-3 times a night. She is continent unless she sneezes hard   She is on daily aspirin    Urine reviewed and sent for culture. Chart reviewed. Bladder scan 15 mL   I reviewed CT scan she had a 12 mm stone left ureteropelvic junction.  She had a KUB in our clinic today. The stone is easily visualized. No bony abnormalities. Renal shadows normal. Gas  pattern normal.   Picture drawn. I discussed lithotripsy with the patient. Pros cons and risk and rare sequelae discussed. Indication go to the [facility] emergency room discussed. Call if culture positive. It is okay if she finishes the Keflex . She needs to stop her aspirin . I thought it was best to call in a few more oxycodone     PLAN:            Medications New Meds: Percocet 5-325 MG Tablet 1-2 tablet PO Q 6 H PRN   #10  0 Refill(s)  Pharmacy Name:  [id number]  Address:  [address]   [place], [place] [address]  Phone:  [phone]  Fax:  [phone]            Document Letter(s):  Created for Patient: Clinical Summary         Next Appointment:      Next Appointment: 07/16/2024 12:30 PM    Appointment Type: Surgery     Location: [facility] [phone]    Provider: Norleen Seltzer, M.D.    Reason for Visit: OP--WL---LEFT ESWL

## 2024-07-13 NOTE — Progress Notes (Signed)
 Spoke w/ via phone for pre-op interview---Pt Lab needs dos----   KUB      Lab results------ COVID test --Not indicated---patient states asymptomatic no test needed Arrive at -------1030 NPO after MN NO Solid Food.  Clear liquids from MN until---0630 Pre-Surgery Ensure or G2:  Med rec completed Medications to take morning of surgery -----amlodipine , flomax , zetia , lipitor , lexapro , imdur , keflex , prilosec, and, if needed, oxy, zofran  Diabetic medication -----  GLP1 agonist last dose: GLP1 instructions:  Patient instructed no nail polish to be worn day of surgery Patient instructed to bring photo id and insurance card day of surgery Patient aware to have Driver (ride ) / caregiver    for 24 hours after surgery - dtr- [name] Patient Special Instructions -----Take laxative of choice on Sunday, hydrate well, eat light meal that evening, stop all NSAIDS, vitamins, supplements, bring blue folder,  Pre-Op special Instructions -----per [name] / litho  Patient verbalized understanding of instructions that were given at this phone interview. Patient denies chest pain, sob, fever, cough at the interview.

## 2024-07-16 ENCOUNTER — Other Ambulatory Visit: Payer: Self-pay

## 2024-07-16 ENCOUNTER — Encounter (HOSPITAL_COMMUNITY)
Admission: RE | Disposition: A | Discharge status: Home / Self Care | Payer: Self-pay | Source: Home / Self Care | Attending: Urology

## 2024-07-16 ENCOUNTER — Ambulatory Visit (HOSPITAL_COMMUNITY)

## 2024-07-16 ENCOUNTER — Encounter (HOSPITAL_COMMUNITY): Payer: Self-pay | Admitting: Urology

## 2024-07-16 ENCOUNTER — Ambulatory Visit (HOSPITAL_COMMUNITY)
Admission: RE | Admit: 2024-07-16 | Discharge: 2024-07-16 | Disposition: A | Discharge status: Home / Self Care | Attending: Urology | Admitting: Urology

## 2024-07-16 DIAGNOSIS — N201 Calculus of ureter: Secondary | ICD-10-CM | POA: Diagnosis present

## 2024-07-16 DIAGNOSIS — R140 Abdominal distension (gaseous): Secondary | ICD-10-CM | POA: Diagnosis not present

## 2024-07-16 DIAGNOSIS — I10 Essential (primary) hypertension: Secondary | ICD-10-CM | POA: Insufficient documentation

## 2024-07-16 SURGERY — LITHOTRIPSY, ESWL
Anesthesia: LOCAL | Laterality: Left

## 2024-07-16 MED ORDER — DIAZEPAM 5 MG PO TABS: 10.0000 mg | ORAL_TABLET | ORAL | Status: DC

## 2024-07-16 MED ORDER — DIPHENHYDRAMINE HCL 25 MG PO CAPS
25.0000 mg | ORAL_CAPSULE | ORAL | Status: AC
  Administered 2024-07-16: 25 mg via ORAL
  Filled 2024-07-16: qty 1

## 2024-07-16 MED ORDER — DIAZEPAM 5 MG PO TABS
ORAL_TABLET | ORAL | Status: AC
  Filled 2024-07-16: qty 1

## 2024-07-16 MED ORDER — SODIUM CHLORIDE 0.9 % IV SOLN
INTRAVENOUS | Status: DC
  Administered 2024-07-16: 1000 mL via INTRAVENOUS

## 2024-07-16 MED ORDER — SODIUM CHLORIDE 0.9% FLUSH: 3.0000 mL | Freq: Two times a day (BID) | INTRAVENOUS | Status: DC

## 2024-07-16 MED ORDER — DIAZEPAM 5 MG PO TABS
5.0000 mg | ORAL_TABLET | ORAL | Status: AC
  Administered 2024-07-16: 5 mg via ORAL

## 2024-07-16 MED ORDER — CIPROFLOXACIN HCL 500 MG PO TABS
500.0000 mg | ORAL_TABLET | ORAL | Status: AC
  Administered 2024-07-16: 500 mg via ORAL
  Filled 2024-07-16: qty 1

## 2024-07-16 NOTE — Interval H&P Note (Signed)
 History and Physical Interval Note: No change  07/16/2024 12:26 PM  [name]  has presented today for surgery, with the diagnosis of LEFT URETERAL STONE.  The various methods of treatment have been discussed with the patient and family. After consideration of risks, benefits and other options for treatment, the patient has consented to  Procedure(s): LITHOTRIPSY, ESWL (Left) as a surgical intervention.  The patient's history has been reviewed, patient examined, no change in status, stable for surgery.  I have reviewed the patient's chart and labs.  Questions were answered to the patient's satisfaction.     Candra Wegner

## 2024-07-17 ENCOUNTER — Encounter (HOSPITAL_COMMUNITY): Payer: Self-pay | Admitting: Urology

## 2024-07-17 ENCOUNTER — Other Ambulatory Visit: Payer: Self-pay | Admitting: Family Medicine

## 2024-07-18 DIAGNOSIS — N201 Calculus of ureter: Secondary | ICD-10-CM | POA: Diagnosis not present

## 2024-07-18 DIAGNOSIS — R8271 Bacteriuria: Secondary | ICD-10-CM | POA: Diagnosis not present

## 2024-07-23 ENCOUNTER — Other Ambulatory Visit: Payer: Self-pay | Admitting: Family Medicine

## 2024-08-04 ENCOUNTER — Other Ambulatory Visit: Payer: Self-pay | Admitting: Family Medicine

## 2024-08-13 ENCOUNTER — Encounter (HOSPITAL_COMMUNITY): Payer: Self-pay | Admitting: Urology

## 2024-08-13 NOTE — H&P (Signed)
 I was consulted to assess the patient's left ureteral stone. On Friday she had pain with dry heaves. No blood in urine. She had a CT scan which showed a 12 mm left ureteropelvic junction stone with mild hydronephrosis. She had a very small stone in the lower pole left kidney. Right kidney normal.   She has pain off-and-on since. She will take oxycodone  and Zofran  as needed. They put her on Keflex . They put her on Flomax .   At baseline she voids every 2 or 3 hours and gets up 2-3 times a night. She is continent unless she sneezes hard   She is on daily aspirin    No history of kidney stones bladder surgery or bladder infections. No neurologic issues   07/18/24: Chosen returns today from her ESWL on 7/21. The stone didn't appear to fragement during treatment. KUB today confirms minimal effect. She has some pain and is using about 1 pain pill a day. She has no hematuria, N/V or voiding symptoms. UA has some pyuria, hematuria and bacteria. culture ordered. She is on keflex .     ALLERGIES: Trazodone  - Dizziness    MEDICATIONS: Aspirin   Percocet 5-325 MG Tablet 1-2 tablet PO Q 6 H PRN  amLODIPine  Besylate 5 MG Tablet  Atorvastatin  Calcium  80 MG Tablet 1 tablet PO Daily  B12  Cephalexin  500 MG Capsule 1 capsule PO Daily  Diclofenac  Sodium 1 % Gel  Escitalopram  Oxalate 20 MG Tablet  Ezetimibe  10 MG Tablet 1 tablet PO Daily  Ferrous Sulfate  325 (65 Fe) MG Tablet  Isosorbide  Mononitrate ER 60 MG Tablet Extended Release 24 Hour 1 tablet PO Daily  Nitroglycerin  0.4 MG Tablet Sublingual 1 tablet PO Daily  Omeprazole  40 MG Capsule Delayed Release 1 capsule PO Daily  Ondansetron  HCl 4 MG Tablet 1 tablet PO Daily  oxyCODONE -Acetaminophen  5-325 MG Tablet 1 tablet PO Daily  Tamsulosin  HCl 0.4 MG Capsule 1 capsule PO Daily  Triamcinolone  Acetonide 0.1 % Cream  Vitamin D3     GU PSH: No GU PSH      PSH Notes: veins 2005    NON-GU PSH: No Non-GU PSH    GU PMH: Stress Incontinence -  07/11/2024 Ureteral calculus - 07/11/2024 Urinary Frequency - 07/11/2024    NON-GU PMH: Arthritis GERD Heart disease, unspecified Hypercholesterolemia Hypertension    FAMILY HISTORY: 3 daughters - Daughter Blood in the urine - Brother Deceased - Father, Mother Diabetes - Brother, Mother, Sister heart - Mother Kidney Stones - Brother Prostate Cancer - Brother suicide - Father   SOCIAL HISTORY: Marital Status: Widowed Preferred Language: English; Ethnicity: Not Hispanic Or Latino; Race: White Current Smoking Status: Patient does not smoke anymore. Has not smoked since 06/26/1974. Smoked 1 pack per day.   Tobacco Use Assessment Completed: Used Tobacco in last 30 days? Does not use smokeless tobacco. Has never drank.  Drinks 1 caffeinated drink per day. Patient's occupation is/was n/a.    REVIEW OF SYSTEMS:    GU Review Female:   Patient denies being pregnant, have to strain to urinate, get up at night to urinate, leakage of urine, burning /pain with urination, trouble starting your stream, stream starts and stops, hard to postpone urination, and frequent urination.  Gastrointestinal (Upper):   Patient denies nausea, vomiting, and indigestion/ heartburn.  Gastrointestinal (Lower):   Patient denies diarrhea and constipation.  Constitutional:   Patient denies fever, night sweats, weight loss, and fatigue.  Skin:   Patient denies skin rash/ lesion and itching.  Eyes:  Patient denies blurred vision and double vision.  Ears/ Nose/ Throat:   Patient denies sore throat and sinus problems.  Hematologic/Lymphatic:   Patient denies swollen glands and easy bruising.  Cardiovascular:   Patient denies leg swelling and chest pains.  Respiratory:   Patient denies cough and shortness of breath.  Endocrine:   Patient denies excessive thirst.  Musculoskeletal:   Patient denies back pain and joint pain.  Neurological:   Patient denies headaches and dizziness.  Psychologic:   Patient denies  depression and anxiety.   VITAL SIGNS:      07/18/2024 02:49 PM  Weight 283 lb / 128.37 kg  Height 65 in / 165.1 cm  BP 148/75 mmHg  Heart Rate 63 /min  Temperature 98.4 F / 36.8 C  BMI 47.1 kg/m   MULTI-SYSTEM PHYSICAL EXAMINATION:    Constitutional: Well-nourished. No physical deformities. Normally developed. Good grooming.  Respiratory: Normal breath sounds. No labored breathing, no use of accessory muscles.   Cardiovascular: Regular rate and rhythm. No murmur, no gallop.      Complexity of Data:  Records Review:   Previous Patient Records  Urine Test Review:   Urinalysis  X-Ray Review: KUB: Reviewed Films. Discussed With Patient.     PROCEDURES:         KUB - [id number]  A single view of the abdomen is obtained. The left proximal stone is not signficantly changed. There are small LLP stones that are possible increased in number but that is a subtle finding. No new bone, gas or soft tissue abnormalities.       Patient confirmed No Neulasta OnPro Device.           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   I have reviewed the risks of ureteroscopy including bleeding, infection, ureteral injury, need for a stent or secondary procedures, thrombotic events and anesthetic complications.    1 NON-GU:   Bacteriuria - R82.71 Minor - Urine culture today. She is on keflex .    PLAN:            Orders Labs Urine Culture          Schedule Return Visit/Planned Activity: ASAP - Schedule Surgery  Procedure: Unspecified Date - Cysto Uretero Lithotripsy - [phone], left          Document Letter(s):  Created for Patient: Clinical Summary         Notes:   CC: Dr. Garnette Lukes MD.         Next Appointment:      Next Appointment: 07/24/2024 08:00 AM    Appointment Type: KUB    Location: [facility] [phone]    Provider: KUB KUB    Reason for Visit: 1 WK  PO--LEFT ESWL

## 2024-08-13 NOTE — Progress Notes (Signed)
 Spoke w/ via phone for pre-op interview--- [name] needs dos----  BMP and EKG per anesthesia.       Alvarado results------ COVID test -----patient states asymptomatic no test needed Arrive at -------0530 NPO after MN NO Solid Food.   Pre-Surgery Ensure or G2:  Med rec completed Medications to take morning of surgery ----- Norvasc , Lexapro , Prilosec and Oxycodone  PRN. Diabetic medication -----  GLP1 agonist last dose: GLP1 instructions:  Patient instructed no nail polish to be worn day of surgery Patient instructed to bring photo id and insurance card day of surgery Patient aware to have Driver (ride ) / caregiver    for 24 hours after surgery - Daughter [name] Patient Special Instructions ----- Pre-Op special Instructions -----  Patient verbalized understanding of instructions that were given at this phone interview. Patient denies chest pain, sob, fever, cough at the interview.

## 2024-08-14 ENCOUNTER — Encounter (HOSPITAL_COMMUNITY)
Admission: RE | Disposition: A | Discharge status: Home / Self Care | Payer: Self-pay | Source: Home / Self Care | Attending: Urology

## 2024-08-14 ENCOUNTER — Encounter (HOSPITAL_COMMUNITY): Payer: Self-pay | Admitting: Urology

## 2024-08-14 ENCOUNTER — Ambulatory Visit (HOSPITAL_COMMUNITY)

## 2024-08-14 ENCOUNTER — Ambulatory Visit (HOSPITAL_COMMUNITY)
Admission: RE | Admit: 2024-08-14 | Discharge: 2024-08-14 | Disposition: A | Discharge status: Home / Self Care | Attending: Urology | Admitting: Urology

## 2024-08-14 DIAGNOSIS — F32A Depression, unspecified: Secondary | ICD-10-CM | POA: Diagnosis not present

## 2024-08-14 DIAGNOSIS — Z79899 Other long term (current) drug therapy: Secondary | ICD-10-CM | POA: Insufficient documentation

## 2024-08-14 DIAGNOSIS — I25119 Atherosclerotic heart disease of native coronary artery with unspecified angina pectoris: Secondary | ICD-10-CM | POA: Diagnosis not present

## 2024-08-14 DIAGNOSIS — K219 Gastro-esophageal reflux disease without esophagitis: Secondary | ICD-10-CM | POA: Diagnosis not present

## 2024-08-14 DIAGNOSIS — Z466 Encounter for fitting and adjustment of urinary device: Secondary | ICD-10-CM | POA: Diagnosis not present

## 2024-08-14 DIAGNOSIS — Z87891 Personal history of nicotine dependence: Secondary | ICD-10-CM | POA: Diagnosis not present

## 2024-08-14 DIAGNOSIS — I10 Essential (primary) hypertension: Secondary | ICD-10-CM | POA: Diagnosis not present

## 2024-08-14 DIAGNOSIS — N202 Calculus of kidney with calculus of ureter: Secondary | ICD-10-CM | POA: Diagnosis not present

## 2024-08-14 DIAGNOSIS — M1990 Unspecified osteoarthritis, unspecified site: Secondary | ICD-10-CM | POA: Diagnosis not present

## 2024-08-14 DIAGNOSIS — F419 Anxiety disorder, unspecified: Secondary | ICD-10-CM | POA: Diagnosis not present

## 2024-08-14 DIAGNOSIS — N132 Hydronephrosis with renal and ureteral calculous obstruction: Secondary | ICD-10-CM | POA: Diagnosis not present

## 2024-08-14 DIAGNOSIS — N201 Calculus of ureter: Secondary | ICD-10-CM | POA: Diagnosis present

## 2024-08-14 HISTORY — DX: Personal history of urinary calculi: Z87.442

## 2024-08-14 LAB — BASIC METABOLIC PANEL WITH GFR
Anion gap: 10 (ref 5–15)
BUN: 19 mg/dL (ref 8–23)
CO2: 24 mmol/L (ref 22–32)
Calcium: 9.1 mg/dL (ref 8.9–10.3)
Chloride: 106 mmol/L (ref 98–111)
Creatinine, Ser: 1.01 mg/dL — ABNORMAL HIGH (ref 0.44–1.00)
GFR, Estimated: 56 mL/min — ABNORMAL LOW (ref >60)
Glucose, Bld: 96 mg/dL (ref 70–99)
Potassium: 3.7 mmol/L (ref 3.5–5.1)
Sodium: 140 mmol/L (ref 135–145)

## 2024-08-14 SURGERY — CYSTOSCOPY/URETEROSCOPY/HOLMIUM LASER/STENT PLACEMENT
Anesthesia: General | Laterality: Left

## 2024-08-14 MED ORDER — ORAL CARE MOUTH RINSE: 15.0000 mL | Freq: Once | OROMUCOSAL | Status: AC

## 2024-08-14 MED ORDER — DEXAMETHASONE SODIUM PHOSPHATE 10 MG/ML IJ SOLN
INTRAMUSCULAR | Status: DC | PRN
  Administered 2024-08-14: 10 mg via INTRAVENOUS

## 2024-08-14 MED ORDER — CHLORHEXIDINE GLUCONATE 0.12 % MT SOLN
15.0000 mL | Freq: Once | OROMUCOSAL | Status: AC
  Administered 2024-08-14: 15 mL via OROMUCOSAL

## 2024-08-14 MED ORDER — CEFAZOLIN SODIUM-DEXTROSE 2-4 GM/100ML-% IV SOLN
2.0000 g | Freq: Once | INTRAVENOUS | Status: AC
  Administered 2024-08-14: 2 g via INTRAVENOUS

## 2024-08-14 MED ORDER — PROPOFOL 10 MG/ML IV BOLUS
INTRAVENOUS | Status: AC
  Filled 2024-08-14: qty 20

## 2024-08-14 MED ORDER — CEFAZOLIN SODIUM-DEXTROSE 2-4 GM/100ML-% IV SOLN
INTRAVENOUS | Status: AC
  Filled 2024-08-14: qty 100

## 2024-08-14 MED ORDER — FENTANYL CITRATE (PF) 250 MCG/5ML IJ SOLN
INTRAMUSCULAR | Status: DC | PRN
  Administered 2024-08-14: 50 ug via INTRAVENOUS

## 2024-08-14 MED ORDER — OXYCODONE-ACETAMINOPHEN 5-325 MG PO TABS: 1.0000 | ORAL_TABLET | Freq: Four times a day (QID) | ORAL | 0 refills | Status: DC | PRN

## 2024-08-14 MED ORDER — ONDANSETRON HCL 4 MG/2ML IJ SOLN
INTRAMUSCULAR | Status: AC
  Filled 2024-08-14: qty 2

## 2024-08-14 MED ORDER — CHLORHEXIDINE GLUCONATE 0.12 % MT SOLN
OROMUCOSAL | Status: AC
  Filled 2024-08-14: qty 15

## 2024-08-14 MED ORDER — DEXAMETHASONE SODIUM PHOSPHATE 10 MG/ML IJ SOLN
INTRAMUSCULAR | Status: AC
Start: 2024-08-14 — End: 2024-08-14
  Filled 2024-08-14: qty 1

## 2024-08-14 MED ORDER — PROPOFOL 10 MG/ML IV BOLUS
INTRAVENOUS | Status: DC | PRN
  Administered 2024-08-14: 120 mg via INTRAVENOUS

## 2024-08-14 MED ORDER — EPHEDRINE 5 MG/ML INJ
INTRAVENOUS | Status: AC
  Filled 2024-08-14: qty 5

## 2024-08-14 MED ORDER — LACTATED RINGERS IV SOLN: INTRAVENOUS | Status: DC

## 2024-08-14 MED ORDER — FENTANYL CITRATE (PF) 250 MCG/5ML IJ SOLN
INTRAMUSCULAR | Status: AC
  Filled 2024-08-14: qty 5

## 2024-08-14 MED ORDER — LIDOCAINE 2% (20 MG/ML) 5 ML SYRINGE
INTRAMUSCULAR | Status: DC | PRN
  Administered 2024-08-14: 100 mg via INTRAVENOUS

## 2024-08-14 MED ORDER — SODIUM CHLORIDE 0.9 % IR SOLN
Status: DC | PRN
  Administered 2024-08-14: 3000 mL via INTRAVESICAL

## 2024-08-14 MED ORDER — SODIUM CHLORIDE 0.9% FLUSH: 3.0000 mL | Freq: Two times a day (BID) | INTRAVENOUS | Status: DC

## 2024-08-14 MED ORDER — ONDANSETRON HCL 4 MG/2ML IJ SOLN
INTRAMUSCULAR | Status: DC | PRN
  Administered 2024-08-14: 4 mg via INTRAVENOUS

## 2024-08-14 MED ORDER — EPHEDRINE SULFATE-NACL 50-0.9 MG/10ML-% IV SOSY
PREFILLED_SYRINGE | INTRAVENOUS | Status: DC | PRN
  Administered 2024-08-14: 5 mg via INTRAVENOUS

## 2024-08-14 MED ORDER — LIDOCAINE 2% (20 MG/ML) 5 ML SYRINGE
INTRAMUSCULAR | Status: AC
Start: 2024-08-14 — End: 2024-08-14
  Filled 2024-08-14: qty 5

## 2024-08-14 MED ORDER — IOHEXOL 300 MG/ML  SOLN
INTRAMUSCULAR | Status: DC | PRN
  Administered 2024-08-14: 6 mL via URETHRAL

## 2024-08-14 SURGICAL SUPPLY — 24 items
BAG DRAIN URO-CYSTO SKYTR STRL (DRAIN) ×1 IMPLANT
BASKET ZERO TIP NITINOL 2.4FR (BASKET) IMPLANT
CATH URET 5FR 70CM CONE TIP (BALLOONS) IMPLANT
CATH URETL OPEN 5X70 (CATHETERS) IMPLANT
CNTNR URN SCR LID CUP LEK RST (MISCELLANEOUS) IMPLANT
ELECTRODE REM PT RTRN 9FT ADLT (ELECTROSURGICAL) IMPLANT
EXTRACTOR STONE 1.7FRX115CM (UROLOGICAL SUPPLIES) IMPLANT
FIBER LASER FLEXIVA 200 (UROLOGICAL SUPPLIES) IMPLANT
GLOVE SURG SS PI 8.0 STRL IVOR (GLOVE) ×1 IMPLANT
GOWN STRL SURGICAL XL XLNG (GOWN DISPOSABLE) ×1 IMPLANT
GUIDEWIRE ANG ZIPWIRE 038X150 (WIRE) IMPLANT
GUIDEWIRE STR DUAL SENSOR (WIRE) ×1 IMPLANT
LASER FIB FLEXIVA PULSE ID 365 (Laser) IMPLANT
MANIFOLD NEPTUNE II (INSTRUMENTS) ×1 IMPLANT
NS IRRIG 500ML POUR BTL (IV SOLUTION) ×1 IMPLANT
PACK CYSTO (CUSTOM PROCEDURE TRAY) ×1 IMPLANT
SHEATH NAVIGATOR HD 11/13X28 (SHEATH) IMPLANT
SHEATH NAVIGATOR HD 11/13X36 (SHEATH) IMPLANT
SHEATH NAVIGATOR HD 12/14X46 (SHEATH) IMPLANT
SLEEVE SCD COMPRESS KNEE MED (STOCKING) ×1 IMPLANT
SOL .9 NS 3000ML IRR UROMATIC (IV SOLUTION) ×2 IMPLANT
STENT URO INLAY 6FRX22CM (STENTS) IMPLANT
TUBE CONNECTING 12X1/4 (SUCTIONS) ×1 IMPLANT
TUBING UROLOGY SET (TUBING) IMPLANT

## 2024-08-14 NOTE — Anesthesia Preprocedure Evaluation (Signed)
 Anesthesia Evaluation  Patient identified by MRN, date of birth, ID band Patient awake    Reviewed: Allergy & Precautions, NPO status , Patient's Chart, lab work & pertinent test results, reviewed documented beta blocker date and time   History of Anesthesia Complications Negative for: history of anesthetic complications  Airway Mallampati: III  TM Distance: >3 FB     Dental no notable dental hx.    Pulmonary neg COPD, former smoker, neg PE   breath sounds clear to auscultation       Cardiovascular hypertension, + angina  + CAD  (-) Past MI, (-) Cardiac Stents, (-) CABG and (-) CHF  Rhythm:Regular Rate:Normal     Neuro/Psych neg Seizures PSYCHIATRIC DISORDERS Anxiety Depression       GI/Hepatic ,GERD  ,,(+) neg Cirrhosis        Endo/Other    Renal/GU Renal disease (stone dz)     Musculoskeletal  (+) Arthritis ,    Abdominal   Peds  Hematology   Anesthesia Other Findings   Reproductive/Obstetrics                              Anesthesia Physical Anesthesia Plan  ASA: 2  Anesthesia Plan: General   Post-op Pain Management:    Induction: Intravenous  PONV Risk Score and Plan: 2  Airway Management Planned: LMA  Additional Equipment:   Intra-op Plan:   Post-operative Plan: Extubation in OR  Informed Consent: I have reviewed the patients History and Physical, chart, labs and discussed the procedure including the risks, benefits and alternatives for the proposed anesthesia with the patient or authorized representative who has indicated his/her understanding and acceptance.     Dental advisory given  Plan Discussed with: CRNA  Anesthesia Plan Comments:         Anesthesia Quick Evaluation

## 2024-08-14 NOTE — Discharge Instructions (Signed)
 You may remove the stent on Friday morning by pulling the string that is tucked vaginally.  If you don't feel you can do that, please call the office to arrange removal.  Please bring the stone fragments to the office for your follow up appointment.  There are some small residual fragments in the lower part of the left kidney that should pass.  To aid passage, please use the postural drainage instructions above.

## 2024-08-14 NOTE — Op Note (Signed)
 Procedure: 1.  Cystoscopy with left retrograde pyelogram and interpretation. 2.  Left ureteroscopy with holmium laser application, stone extraction and insertion of left double-J stent. 3.  Application of fluoroscopy.  Preop diagnosis: Left UPJ and left renal stones.  Postop diagnosis left distal ureteral and left renal stones.  Surgeon: Dr. Norleen Seltzer.  Anesthesia: General.  Specimen: Stone fragments.  Drains: 6 French by 22 cm Bard inlay double-J stent with tether.  EBL: None.  Complications: None.  Indications: The patient is an 83 year old female who previously undergone lithotripsy for a left UPJ stone with only partial fragmentation.  She was left with a 12 mm left UPJ fragment and several smaller lower pole fragments.  She is elected ureteroscopy for further management.  Procedure: She was taken the operating room where she was given 2 g of Ancef .  A general anesthetic was induced.  She was placed in lithotomy position and fitted with PAS hose.  Her perineum and genitalia were prepped with Betadine solution and she was draped in usual sterile fashion.  Cystoscopy was performed using a 21 [place] scope and 30 degree lens.  Examination of the normal urethra.  The bladder wall was smooth and pale without tumors, stones or inflammation.  Ureteral orifices were unremarkable.  The left ureteral orifice was cannulated with a 5 [place] open-ended catheter with the aid of a sensor wire.  A left retrograde pyelogram was performed with 6 mL of Omnipaque .  There was a normal caliber ureter up to right at the inferior border of the sacrum with some dilation proximal that but without a clear filling defect.  There was mild hydronephrosis with a lower pole filling defect that was felt to be consistent with a stone.  The UPJ stone was not clearly noted on the preretrograde fluoroscopy or clearly on the retrograde.  A sensor wire was then advanced to the kidney without difficulty under fluoroscopic  guidance and the cystoscope was removed.  The inner core of a 36 cm 11/13 [place] digital access sheath was passed over the wire.  It easily passed into the distal ureter would not easily passed into the mid ureter.  I then took the 6.5 [place] short semirigid ureteroscope and advanced alongside the wire and found that her stone had migrated to the junction of the mid and distal ureter.  The stone was then fragmented using the 242 m tract tip laser fiber with the laser set on the dusting setting.  The 0.3 J with 53 Hz left pedal was used for the fragmentation.  The fragments were then removed to the bladder with an engage basket.  Once the distal stone had been removed the 11 [place] inner core of the access sheath was then advanced to the kidney without difficulty.  This was followed by the assembled sheath.  The inner core and wire were then removed and a dual-lumen digital flexible scope was passed to the kidney.  The stone in the lower pole was identified.  There were a couple of larger fragments and several small fragments.  The stones were broken into manageable fragments as well and the larger fragments in the 2 to 3 mm range were removed using the engage basket.  Once final inspection only demonstrated sub-2 mm fragments, the ureteroscope was removed and a sensor wire was advanced back to the kidney.  The access sheath was removed and the cystoscope was replaced over the wire.  A 6 French by 22 cm Bard inlay double-J stent was then passed  over the wire to the kidney under fluoroscopic guidance.  The wire was removed leaving a good coil in the kidney and a good coil in the bladder.  The cystoscope was then removed leaving the stent string exiting the urethra.  The scope was then reinserted and the stone fragments were evacuated.  The bladder was then drained and the cystoscope was removed.  The stent string was knotted close to the meatus, trimmed to an appropriate length and then tucked vaginally.   She was taken down from lithotomy position, her anesthetic was reversed and she was moved to recovery in stable condition.  There were no complications.  The stone fragments were given to the patient to bring the office.

## 2024-08-14 NOTE — Transfer of Care (Signed)
 Immediate Anesthesia Transfer of Care Note  Patient: [name]  Procedure(s) Performed: CYSTOSCOPY/URETEROSCOPY/HOLMIUM LASER/STENT PLACEMENT (Left) CYSTOSCOPY, WITH RETROGRADE PYELOGRAM (Left)  Patient Location: PACU  Anesthesia Type:General  Level of Consciousness: awake, alert , and sedated  Airway & Oxygen Therapy: Patient Spontanous Breathing and Patient connected to face mask oxygen  Post-op Assessment: Report given to RN and Post -op Vital signs reviewed and stable  Post vital signs: Reviewed and stable  Last Vitals:  Vitals Value Taken Time  BP 162/66 08/14/24 08:46  Temp    Pulse 68 08/14/24 08:49  Resp 12 08/14/24 08:49  SpO2 90 % 08/14/24 08:49  Vitals shown include unfiled device data.  Last Pain:  Vitals:   08/14/24 0640  TempSrc: Oral  PainSc: 4       Patients Stated Pain Goal: 6 (08/14/24 0640)  Complications: No notable events documented.

## 2024-08-14 NOTE — Anesthesia Postprocedure Evaluation (Signed)
 Anesthesia Post Note  Patient: [name]  Procedure(s) Performed: CYSTOSCOPY/URETEROSCOPY/HOLMIUM LASER/STENT PLACEMENT (Left) CYSTOSCOPY, WITH RETROGRADE PYELOGRAM (Left)     Patient location during evaluation: PACU Anesthesia Type: General Level of consciousness: awake and alert Pain management: pain level controlled Vital Signs Assessment: post-procedure vital signs reviewed and stable Respiratory status: spontaneous breathing, nonlabored ventilation, respiratory function stable and patient connected to nasal cannula oxygen Cardiovascular status: blood pressure returned to baseline and stable Postop Assessment: no apparent nausea or vomiting Anesthetic complications: no   No notable events documented.  Last Vitals:  Vitals:   08/14/24 0945 08/14/24 1000  BP: (!) 155/70 (!) 151/72  Pulse: 63 60  Resp: 11 10  Temp:  36.4 C  SpO2: 95% 97%    Last Pain:  Vitals:   08/14/24 0945  TempSrc:   PainSc: 6                  Lynwood MARLA Cornea

## 2024-08-14 NOTE — Anesthesia Procedure Notes (Signed)
 Procedure Name: LMA Insertion Date/Time: 08/14/2024 7:43 AM  Performed by: Jadesola Poynter C, CRNAPre-anesthesia Checklist: Patient identified, Emergency Drugs available, Suction available and Patient being monitored Patient Re-evaluated:Patient Re-evaluated prior to induction Oxygen Delivery Method: Circle System Utilized Preoxygenation: Pre-oxygenation with 100% oxygen Induction Type: IV induction Ventilation: Mask ventilation without difficulty LMA: LMA inserted LMA Size: 4.0 Number of attempts: 1 Airway Equipment and Method: Bite block Placement Confirmation: positive ETCO2 Tube secured with: Tape Dental Injury: Teeth and Oropharynx as per pre-operative assessment

## 2024-08-14 NOTE — Interval H&P Note (Signed)
 History and Physical Interval Note:  08/14/2024 7:15 AM  [name]  has presented today for surgery, with the diagnosis of LEFT URETERAL STONE.  The various methods of treatment have been discussed with the patient and family. After consideration of risks, benefits and other options for treatment, the patient has consented to  Procedure(s): CYSTOSCOPY/URETEROSCOPY/HOLMIUM LASER/STENT PLACEMENT (Left) CYSTOSCOPY, WITH RETROGRADE PYELOGRAM (Left) as a surgical intervention.  The patient's history has been reviewed, patient examined, no change in status, stable for surgery.  I have reviewed the patient's chart and labs.  Questions were answered to the patient's satisfaction.     Devinn Hurwitz

## 2024-08-15 ENCOUNTER — Encounter (HOSPITAL_COMMUNITY): Payer: Self-pay | Admitting: Urology

## 2024-08-15 ENCOUNTER — Other Ambulatory Visit: Payer: Self-pay | Admitting: Family Medicine

## 2024-08-15 ENCOUNTER — Other Ambulatory Visit: Payer: Self-pay | Admitting: Cardiology

## 2024-08-28 DIAGNOSIS — R8271 Bacteriuria: Secondary | ICD-10-CM | POA: Diagnosis not present

## 2024-08-28 DIAGNOSIS — N201 Calculus of ureter: Secondary | ICD-10-CM | POA: Diagnosis not present

## 2024-08-29 ENCOUNTER — Ambulatory Visit (INDEPENDENT_AMBULATORY_CARE_PROVIDER_SITE_OTHER): Admitting: Family Medicine

## 2024-08-29 ENCOUNTER — Encounter: Payer: Self-pay | Admitting: Family Medicine

## 2024-08-29 VITALS — BP 112/72 | HR 77 | Temp 98.2°F | Ht 63.0 in | Wt 181.4 lb

## 2024-08-29 DIAGNOSIS — I2510 Atherosclerotic heart disease of native coronary artery without angina pectoris: Secondary | ICD-10-CM | POA: Diagnosis not present

## 2024-08-29 DIAGNOSIS — E669 Obesity, unspecified: Secondary | ICD-10-CM | POA: Diagnosis not present

## 2024-08-29 DIAGNOSIS — E785 Hyperlipidemia, unspecified: Secondary | ICD-10-CM | POA: Diagnosis not present

## 2024-08-29 DIAGNOSIS — I10 Essential (primary) hypertension: Secondary | ICD-10-CM

## 2024-08-29 DIAGNOSIS — Z0000 Encounter for general adult medical examination without abnormal findings: Secondary | ICD-10-CM | POA: Diagnosis not present

## 2024-08-29 DIAGNOSIS — E7800 Pure hypercholesterolemia, unspecified: Secondary | ICD-10-CM | POA: Diagnosis not present

## 2024-08-29 DIAGNOSIS — D500 Iron deficiency anemia secondary to blood loss (chronic): Secondary | ICD-10-CM

## 2024-08-29 DIAGNOSIS — Z131 Encounter for screening for diabetes mellitus: Secondary | ICD-10-CM

## 2024-08-29 LAB — IBC + FERRITIN
Ferritin: 47 ng/mL (ref 10.0–291.0)
Iron: 109 ug/dL (ref 42–145)
Saturation Ratios: 35.6 % (ref 20.0–50.0)
TIBC: 306.6 ug/dL (ref 250.0–450.0)
Transferrin: 219 mg/dL (ref 212.0–360.0)

## 2024-08-29 LAB — LIPID PANEL
Cholesterol: 131 mg/dL (ref 0–200)
HDL: 55.3 mg/dL (ref >39.00)
LDL Cholesterol: 56 mg/dL (ref 0–99)
NonHDL: 75.61
Total CHOL/HDL Ratio: 2
Triglycerides: 96 mg/dL (ref 0.0–149.0)
VLDL: 19.2 mg/dL (ref 0.0–40.0)

## 2024-08-29 LAB — CBC WITH DIFFERENTIAL/PLATELET
Basophils Absolute: 0 K/uL (ref 0.0–0.1)
Basophils Relative: 0.7 % (ref 0.0–3.0)
Eosinophils Absolute: 0.1 K/uL (ref 0.0–0.7)
Eosinophils Relative: 2.3 % (ref 0.0–5.0)
HCT: 36.9 % (ref 36.0–46.0)
Hemoglobin: 12.9 g/dL (ref 12.0–15.0)
Lymphocytes Relative: 29.1 % (ref 12.0–46.0)
Lymphs Abs: 1.7 K/uL (ref 0.7–4.0)
MCHC: 34.9 g/dL (ref 30.0–36.0)
MCV: 89.5 fl (ref 78.0–100.0)
Monocytes Absolute: 0.3 K/uL (ref 0.1–1.0)
Monocytes Relative: 5.5 % (ref 3.0–12.0)
Neutro Abs: 3.6 K/uL (ref 1.4–7.7)
Neutrophils Relative %: 62.4 % (ref 43.0–77.0)
Platelets: 250 K/uL (ref 150.0–400.0)
RBC: 4.12 Mil/uL (ref 3.87–5.11)
RDW: 13.8 % (ref 11.5–15.5)
WBC: 5.8 K/uL (ref 4.0–10.5)

## 2024-08-29 LAB — TSH: TSH: 2.39 u[IU]/mL (ref 0.35–5.50)

## 2024-08-29 LAB — COMPREHENSIVE METABOLIC PANEL WITH GFR
ALT: 15 U/L (ref 0–35)
AST: 21 U/L (ref 0–37)
Albumin: 4 g/dL (ref 3.5–5.2)
Alkaline Phosphatase: 62 U/L (ref 39–117)
BUN: 15 mg/dL (ref 6–23)
CO2: 28 meq/L (ref 19–32)
Calcium: 9.3 mg/dL (ref 8.4–10.5)
Chloride: 106 meq/L (ref 96–112)
Creatinine, Ser: 0.77 mg/dL (ref 0.40–1.20)
GFR: 71.75 mL/min (ref >60.00)
Glucose, Bld: 89 mg/dL (ref 70–99)
Potassium: 3.7 meq/L (ref 3.5–5.1)
Sodium: 141 meq/L (ref 135–145)
Total Bilirubin: 0.8 mg/dL (ref 0.2–1.2)
Total Protein: 6.9 g/dL (ref 6.0–8.3)

## 2024-08-29 LAB — HEMOGLOBIN A1C: Hgb A1c MFr Bld: 6.3 % (ref 4.6–6.5)

## 2024-08-29 MED ORDER — AMLODIPINE BESYLATE 5 MG PO TABS: 5.0000 mg | ORAL_TABLET | Freq: Every day | ORAL | 3 refills | Status: AC

## 2024-08-29 MED ORDER — ESCITALOPRAM OXALATE 20 MG PO TABS: 20.0000 mg | ORAL_TABLET | Freq: Every day | ORAL | 3 refills | Status: AC

## 2024-08-29 MED ORDER — NITROGLYCERIN 0.4 MG SL SUBL: 0.4000 mg | SUBLINGUAL_TABLET | SUBLINGUAL | 3 refills | Status: AC | PRN

## 2024-08-29 MED ORDER — EZETIMIBE 10 MG PO TABS: 10.0000 mg | ORAL_TABLET | Freq: Every day | ORAL | 3 refills | Status: AC

## 2024-08-29 MED ORDER — ATORVASTATIN CALCIUM 80 MG PO TABS: 80.0000 mg | ORAL_TABLET | Freq: Every evening | ORAL | 3 refills | Status: AC

## 2024-08-29 MED ORDER — ISOSORBIDE MONONITRATE ER 60 MG PO TB24: 60.0000 mg | ORAL_TABLET | Freq: Every day | ORAL | 3 refills | Status: AC

## 2024-08-29 NOTE — Patient Instructions (Addendum)
 Please stop by lab before you go If you have mychart- we will send your results within 3 business days of us  receiving them.  If you do not have mychart- we will call you about results within 5 business days of us  receiving them.  *please also note that you will see labs on mychart as soon as they post. I will later go in and write notes on them- will say notes from Dr. Katrinka   No changes today unless labs lead us  to make changes  Recommended follow up: Return in about 6 months (around 02/26/2025) for followup or sooner if needed.Schedule b4 you leave.

## 2024-08-29 NOTE — Progress Notes (Signed)
 Phone [phone]   Subjective:  Patient presents today for their annual physical. Chief complaint-noted.   See problem oriented charting- ROS- full  review of systems was completed and negative except for topics noted under acute/chronic concerns   The following were reviewed and entered/updated in epic: Past Medical History:  Diagnosis Date   Anginal pain (HCC) 08/29/2012   have had problems w/this   Anxiety 08/29/2012   Arthritis 08/29/2012   right hand, Seen by Dr. Dolphus   CAD (coronary artery disease)    nonobstructive   Cataract 03/30/07   10/16/09   Depression    Exertional dyspnea 08/29/2012   GERD (gastroesophageal reflux disease)    Hammer toe    surgery x2, second with Dr. Harden- only other option amputatoin   History of kidney stones    HLD (hyperlipidemia)    HTN (hypertension)    PNEUMONIA 07/04/2007   Qualifier: Diagnosis of  By: Amon MD, Aloysius BRAVO.    Syncope and collapse 08/29/2012   first time ever   Patient Active Problem List   Diagnosis Date Noted   Advanced directives, counseling/discussion 01/26/2017    Priority: High   nonobstructive CAD (coronary artery disease) 12/01/2015    Priority: High   Memory loss 12/01/2015    Priority: High   Itchy skin 05/30/2020    Priority: Medium    History of adenomatous polyp of colon 08/01/2017    Priority: Medium    Varicose veins of both lower extremities 12/01/2015    Priority: Medium    Hyperlipidemia, unspecified 05/19/2007    Priority: Medium    Insomnia 12/01/2015    Priority: Low   Glaucoma 12/01/2015    Priority: Low   Hammer toe     Priority: Low   Arthritis 08/29/2012    Priority: Low   GERD 09/06/2009    Priority: Low   Edema 09/06/2009    Priority: Low   History of syncope 09/06/2009    Priority: Low   Hyperglycemia 05/19/2007    Priority: Low   Trigger finger, right middle finger 09/05/2017    Priority: 1.   Primary osteoarthritis of both knees 03/10/2017    Priority: 1.    Primary osteoarthritis of both hands 03/09/2017    Priority: 1.   Primary osteoarthritis of both feet 03/09/2017    Priority: 1.   History of skin cancer 11/10/2023   Bilateral hearing loss 08/04/2021   Excessive cerumen in ear canal, right 08/04/2021   Diaphoresis 05/12/2018   Rheumatoid factor positive 03/09/2017   Past Surgical History:  Procedure Laterality Date   ABDOMINAL HYSTERECTOMY  08/27/1969   APPENDECTOMY  ~ 1970   CATARACT EXTRACTION W/ INTRAOCULAR LENS  IMPLANT, BILATERAL  ~ 2010   bilateral   CYSTOSCOPY W/ RETROGRADES Left 08/14/2024   Procedure: CYSTOSCOPY, WITH RETROGRADE PYELOGRAM;  Surgeon: Watt Rush, MD;  Location: [facility] OR;  Service: Urology;  Laterality: Left;   CYSTOSCOPY/URETEROSCOPY/HOLMIUM LASER/STENT PLACEMENT Left 08/14/2024   Procedure: CYSTOSCOPY/URETEROSCOPY/HOLMIUM LASER/STENT PLACEMENT;  Surgeon: Watt Rush, MD;  Location: [facility] OR;  Service: Urology;  Laterality: Left;   EXTRACORPOREAL SHOCK WAVE LITHOTRIPSY Left 07/16/2024   Procedure: LITHOTRIPSY, ESWL;  Surgeon: Watt Rush, MD;  Location: [facility] ORS;  Service: Urology;  Laterality: Left;   EYE SURGERY  03/30/07   10/16/09   LEFT HEART CATH AND CORONARY ANGIOGRAPHY N/A 08/01/2023   Procedure: LEFT HEART CATH AND CORONARY ANGIOGRAPHY;  Surgeon: Verlin Lonni BIRCH, MD;  Location: [facility];  Service: Cardiovascular;  Laterality:  N/A;   TONSILLECTOMY AND ADENOIDECTOMY  08/27/1949   VEIN LIGATION AND STRIPPING  12/27/2010   bilaterally    Family History  Problem Relation Age of Onset   Heart disease Mother        brother, father   Diabetes Mother        sister, brother   Vision loss Mother    Suicidality Father        47   Depression Father    Heart disease Father    Vision loss Father    Coronary artery disease Brother    Colon cancer Brother    Stroke Brother    ADD / ADHD Brother    Anxiety disorder Brother    Cancer Brother    Depression Brother    Diabetes Brother     Alcoholism Brother    Alcohol abuse Brother    Depression Brother    Diabetes Brother    Vision loss Brother    Ovarian cancer Daughter    Arthritis Sister    Diabetes Sister    Hearing loss Sister    Diabetes Sister    Esophageal cancer Neg Hx    Rectal cancer Neg Hx    Stomach cancer Neg Hx    Liver cancer Neg Hx     Medications- reviewed and updated Current Outpatient Medications  Medication Sig Dispense Refill   aspirin  81 MG tablet Take 81 mg by mouth daily.      Cholecalciferol (VITAMIN D3) 125 MCG (5000 UT) CAPS Take 5,000 Units by mouth daily.     diclofenac  sodium (VOLTAREN ) 1 % GEL Apply 3 gm to 3 large joints up to 3 times a day.Dispense 3 tubes with 3 refills. 3 Tube 3   omeprazole  (PRILOSEC) 40 MG capsule TAKE ONE CAPSULE BY MOUTH TWICE DAILY 60 capsule 6   triamcinolone  cream (KENALOG ) 0.1 % Apply 1 Application topically 2 (two) times daily. For 7-10 days maximum 80 g 0   vitamin B-12 (CYANOCOBALAMIN ) 1000 MCG tablet Take 1,000 mcg by mouth daily.     amLODipine  (NORVASC ) 5 MG tablet Take 1 tablet (5 mg total) by mouth daily. 90 tablet 3   atorvastatin  (LIPITOR ) 80 MG tablet Take 1 tablet (80 mg total) by mouth every evening. 90 tablet 3   escitalopram  (LEXAPRO ) 20 MG tablet Take 1 tablet (20 mg total) by mouth daily. 90 tablet 3   ezetimibe  (ZETIA ) 10 MG tablet Take 1 tablet (10 mg total) by mouth daily. 90 tablet 3   isosorbide  mononitrate (IMDUR ) 60 MG 24 hr tablet Take 1 tablet (60 mg total) by mouth daily. 90 tablet 3   nitroGLYCERIN  (NITROSTAT ) 0.4 MG SL tablet Place 1 tablet (0.4 mg total) under the tongue every 5 (five) minutes as needed for chest pain. 25 tablet 3   No current facility-administered medications for this visit.    Allergies-reviewed and updated Allergies  Allergen Reactions   Clindamycin/Lincomycin     Severe rash and into throat   Trazodone  And Nefazodone Other (See Comments)    drowsiness    Social History   Social History  Narrative   Widowed 10-28-2013. Daughter died Sep 28, 2012. 2 living children. 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      Hobbies: gardening, time with  dog, time with grandkids   Objective  Objective:  BP 112/72 (BP Location: Left Arm, Patient Position: Sitting, Cuff Size: Normal)   Pulse 77   Temp 98.2 F (36.8 C) (Temporal)   Ht 5' 3 (1.6 m)   Wt 181 lb 6.4 oz (82.3 kg)   SpO2 98%   BMI 32.13 kg/m  Gen: NAD, resting comfortably HEENT: Mucous membranes are moist. Oropharynx normal Neck: no thyromegaly CV: RRR no murmurs rubs or gallops Lungs: CTAB no crackles, wheeze, rhonchi Abdomen: soft/nontender/nondistended/normal bowel sounds. No rebound or guarding.  Ext: no edema Skin: warm, dry Neuro: grossly normal, moves all extremities, PERRLA   Assessment and Plan   83 y.o. female presenting for annual physical.  Health Maintenance counseling: 1. Anticipatory guidance: Patient counseled regarding regular dental exams -q6 months, eye exams - yearly,  avoiding smoking and second hand smoke , limiting alcohol to 1 beverage per day-very rare social , no illicit drugs .   2. Risk factor reduction:  Advised patient of need for regular exercise and diet rich and fruits and vegetables to reduce risk of heart attack and stroke.  Exercise- [employer] 2-3 days a week- encouraged to keep that up- enjoys classes.  Diet/weight management-nice gradual weight loss- down 9 lbs from last phsycal.  Wt Readings from Last 3 Encounters:  08/29/24 181 lb 6.4 oz (82.3 kg)  08/14/24 185 lb (83.9 kg)  07/16/24 183 lb (83 kg)  3. Immunizations/screenings/ancillary studies- flu shot last weke, also reports had Tetanus, Diphtheria, and Pertussis (Tdap) as wants to see great great grandbaby. Plans on  COVID in October.  Immunization History  Administered Date(s) Administered    sv, Bivalent, Protein Subunit Rsvpref,pf (Abrysvo) 09/17/2022   Fluad Quad(high Dose 65+) 09/25/2019, 10/07/2020, 09/17/2022, 08/06/2024   Fluad Trivalent(High Dose 65+) 09/27/2023   INFLUENZA, HIGH DOSE SEASONAL PF 09/10/2014, 10/10/2015, 08/24/2016, 09/28/2018, 09/17/2022   Influenza-Unspecified 11/12/2015, 09/25/2019, 09/03/2021   PFIZER Comirnaty(Gray Top)Covid-19 Tri-Sucrose Vaccine 02/17/2021, 09/22/2022   PFIZER(Purple Top)SARS-COV-2 Vaccination 01/21/2020, 02/11/2020, 08/19/2020, 11/13/2021   PNEUMOCOCCAL CONJUGATE-20 09/22/2022   PPD Test 05/06/2021   Pfizer Covid-19 Vaccine Bivalent Booster 34yrs & up 11/13/2021   Pfizer(Comirnaty)Fall Seasonal Vaccine 12 years and older 09/27/2022   Pneumococcal Conjugate-13 08/24/2016   Pneumococcal Polysaccharide-23 12/29/1999   Respiratory Syncytial Virus Vaccine,Recomb Aduvanted(Arexvy) 09/17/2022   Td 12/28/2005, 10/26/2016   Tdap 09/22/2011, 08/06/2024   Zoster Recombinant(Shingrix) 12/01/2021, 02/12/2022   4. Cervical cancer screening- no History of abnormal Pap smear-past age based screening recommendations. No blood or discharge from vagina noted 5. Breast cancer screening-  breast exam (prefers self exam) and mammogram -rebruary 2025 mammogram 6. Colon cancer screening - was told no further colonoscopy due to age per Dr. Shila 7. Skin cancer screening-[place] derm yearly now Dr. Lynnell. advised regular sunscreen use. Denies worrisome, changing, or new skin lesions- has upcoming visit in november.  8. Birth control/STD check- not dating 73. Osteoporosis screening at 65- 05/18/2021 normal- wants to wait for 5 years 10. Smoking associated screening -former smoker-quit smoking in 1980-no regular screening required  Status of chronic or acute concerns   # Kidney stone-addressed with surgery by Dr. Watt on 08/14/24  after failing lithotripsy on 07/16/24- upcoming  ultrasound in 6 weeks to reevaluate  # Rheumatology- rheumatoid factor positive but appears primarily treated for hand arthritis by Dr. Dolphus- November follow up she reports  #Nonobstructive CAD - cath 08/01/23- prefers to see PCP only #carotid artery stenosis 08/04/23 mild #hyperlipidemia S: Medication:Atorvastatin   80 mg, aspirin  81 mg, imdur  60 mg antianginal -zetia  added June 2024  No recent chest pain or shortness of breath  Lab Results  Component Value Date   CHOL 128 11/10/2023   HDL 48.70 11/10/2023   LDLCALC 59 11/10/2023   LDLDIRECT 65.0 05/30/2020   TRIG 101.0 11/10/2023   CHOLHDL 3 11/10/2023  A/P: coronary artery disease asymptomatic continue to monitor  Lipids at goal last year with LDL under 70- update today Carotid artery stenosis - continue to modify risk factors   #hypertension S: medication: Amlodipine  2.5 mg--> 10 mg--> 5 mg due to edema and has required Lasix  20 mg in the past with higher dose, Imdur  60 mg BP Readings from Last 3 Encounters:  08/29/24 112/72  08/14/24 (!) 151/72  07/16/24 (!) 159/69   A/P: well controlled continue current medications    # Depression S: Medication:Lexapro  20 mg    08/29/2024   10:04 AM 11/10/2023    8:37 AM 09/27/2023   10:27 AM  Depression screen PHQ 2/9  Decreased Interest 0 0 0  Down, Depressed, Hopeless 0 0 0  PHQ - 2 Score 0 0 0  Altered sleeping 2 3 2   Tired, decreased energy 0 1 0  Change in appetite 0 0 0  Feeling bad or failure about yourself  0 0 0  Trouble concentrating 0 0 0  Moving slowly or fidgety/restless 0 0 0  Suicidal thoughts 0 0 0  PHQ-9 Score 2 4 2   Difficult doing work/chores Not difficult at all Somewhat difficult Not difficult at all  A/P: full remission- continue to monitor    # GERD S:Medication: Omeprazole  20 mg--> 40 mg 11/10/22 twice daily through GI B12 levels related to PPI use: Takes B12 prophylactically A/P: well controlled continue current medications    #  Hyperglycemia/insulin -resistance/prediabetes- a1c up to 5.9 S:  Medication: none Lab Results  Component Value Date   HGBA1C 6.1 01/13/2024   HGBA1C 5.9 06/01/2023   HGBA1C 5.5 11/10/2022  A/P: hopefully stable- update a1c today. Continue without meds for now . Keep up walking and weight loss  #Anemia- arteriovenous malformation(s) (AVM) related in 2024- has seen hematology Prenatal vitamin in 2025 . Update levels today  # Asymmetrical hearing loss noted at [employer] and referred to ENT-thankfully MRI 2024 reassuring-sees Dr. Tobie - has hearing aids but not wearing- encouraged   Recommended follow up: Return in about 6 months (around 02/26/2025) for followup or sooner if needed.Schedule b4 you leave. Future Appointments  Date Time Provider Department Center  03/07/2025 10:00 AM Dolphus Reiter, MD [facility] None   Lab/Order associations: fasting   ICD-10-CM   1. Preventative health care  Z00.00     2. Coronary artery disease involving native coronary artery of native heart without angina pectoris  I25.10     3. Pure hypercholesterolemia  E78.00 Comprehensive metabolic panel with GFR    CBC with Differential/Platelet    Lipid panel    TSH    4. Essential hypertension  I10     5. Iron deficiency anemia due to chronic blood loss  D50.0 IBC + Ferritin    6. Screening for diabetes mellitus  Z13.1 Hemoglobin A1c    7. Obesity (BMI 30-39.9)  E66.9 Hemoglobin A1c    8. Hyperlipidemia LDL goal <70  E78.5 ezetimibe  (ZETIA ) 10 MG tablet      Meds ordered this encounter  Medications   amLODipine  (NORVASC ) 5 MG tablet    Sig: Take 1 tablet (5 mg  total) by mouth daily.    Dispense:  90 tablet    Refill:  3   escitalopram  (LEXAPRO ) 20 MG tablet    Sig: Take 1 tablet (20 mg total) by mouth daily.    Dispense:  90 tablet    Refill:  3    Appointment needed for future refills.   atorvastatin  (LIPITOR ) 80 MG tablet    Sig: Take 1 tablet (80 mg total) by mouth every evening.    Dispense:   90 tablet    Refill:  3   isosorbide  mononitrate (IMDUR ) 60 MG 24 hr tablet    Sig: Take 1 tablet (60 mg total) by mouth daily.    Dispense:  90 tablet    Refill:  3   ezetimibe  (ZETIA ) 10 MG tablet    Sig: Take 1 tablet (10 mg total) by mouth daily.    Dispense:  90 tablet    Refill:  3   nitroGLYCERIN  (NITROSTAT ) 0.4 MG SL tablet    Sig: Place 1 tablet (0.4 mg total) under the tongue every 5 (five) minutes as needed for chest pain.    Dispense:  25 tablet    Refill:  3    Return precautions advised.  Garnette Lukes, MD

## 2024-08-30 ENCOUNTER — Ambulatory Visit: Payer: Self-pay | Admitting: Family Medicine

## 2024-09-01 ENCOUNTER — Other Ambulatory Visit: Payer: Self-pay | Admitting: Family Medicine

## 2024-09-07 ENCOUNTER — Encounter: Payer: Self-pay | Admitting: Family Medicine

## 2024-09-11 ENCOUNTER — Other Ambulatory Visit: Payer: Self-pay | Admitting: Family Medicine

## 2024-09-11 MED ORDER — OMEPRAZOLE 20 MG PO CPDR: 20.0000 mg | DELAYED_RELEASE_CAPSULE | Freq: Two times a day (BID) | ORAL | 3 refills | Status: DC

## 2024-09-14 ENCOUNTER — Other Ambulatory Visit: Payer: Self-pay | Admitting: Family Medicine

## 2024-09-14 MED ORDER — OMEPRAZOLE 20 MG PO CPDR: 20.0000 mg | DELAYED_RELEASE_CAPSULE | Freq: Two times a day (BID) | ORAL | 3 refills | Status: DC

## 2024-10-04 NOTE — Progress Notes (Signed)
 [name]                                          MRN: [id number]   10/04/2024   The [facility] Quality Team Specialist reviewed this patient medical record for the purposes of chart review for care gap closure. The following were reviewed: abstraction for care gap closure-controlling blood pressure.    [facility] Quality Team

## 2024-10-23 DIAGNOSIS — N390 Urinary tract infection, site not specified: Secondary | ICD-10-CM | POA: Diagnosis not present

## 2024-10-23 DIAGNOSIS — N200 Calculus of kidney: Secondary | ICD-10-CM | POA: Diagnosis not present

## 2024-10-23 DIAGNOSIS — R399 Unspecified symptoms and signs involving the genitourinary system: Secondary | ICD-10-CM | POA: Diagnosis not present

## 2024-10-26 DIAGNOSIS — H5712 Ocular pain, left eye: Secondary | ICD-10-CM | POA: Diagnosis not present

## 2024-11-01 DIAGNOSIS — L820 Inflamed seborrheic keratosis: Secondary | ICD-10-CM | POA: Diagnosis not present

## 2024-11-01 DIAGNOSIS — D692 Other nonthrombocytopenic purpura: Secondary | ICD-10-CM | POA: Diagnosis not present

## 2024-11-01 DIAGNOSIS — L814 Other melanin hyperpigmentation: Secondary | ICD-10-CM | POA: Diagnosis not present

## 2024-11-01 DIAGNOSIS — D485 Neoplasm of uncertain behavior of skin: Secondary | ICD-10-CM | POA: Diagnosis not present

## 2024-11-01 DIAGNOSIS — L821 Other seborrheic keratosis: Secondary | ICD-10-CM | POA: Diagnosis not present

## 2024-11-01 DIAGNOSIS — L570 Actinic keratosis: Secondary | ICD-10-CM | POA: Diagnosis not present

## 2024-11-01 DIAGNOSIS — Z85828 Personal history of other malignant neoplasm of skin: Secondary | ICD-10-CM | POA: Diagnosis not present

## 2024-11-18 NOTE — Progress Notes (Unsigned)
 Office Visit Note  Patient: [name]             Date of Birth: [birth year]           MRN: [id number]             PCP: Katrinka Garnette KIDD, MD Referring: Katrinka Garnette KIDD, MD Visit Date: 11/19/2024 Occupation: Data Unavailable  Subjective:  Pain in both hands  History of Present Illness: [name] is a 83 y.o. female with osteoarthritis and positive rheumatoid factor.  She returns today after her last visit in March 2025.  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  Her CMC joints started hurting again she states the pain recurred about 2 months ago.  She had injections in her bilateral CMC joints in March 2025 which lasted for a while.  She has been using her bilateral CMC braces and also using Voltaren  gel.  He states the knee joint pain in the feet discomfort is tolerable.    Activities of Daily Living:  Patient reports morning stiffness for 0 minutes.   Patient Denies nocturnal pain.  Difficulty dressing/grooming: Denies Difficulty climbing stairs: Denies Difficulty getting out of chair: Denies Difficulty using hands for taps, buttons, cutlery, and/or writing: Reports  Review of Systems  Constitutional:  Negative for fatigue.  HENT:  Negative for mouth sores and mouth dryness.   Eyes:  Positive for dryness.  Respiratory:  Negative for shortness of breath.   Cardiovascular:  Negative for chest pain and palpitations.  Gastrointestinal:  Negative for blood in stool, constipation and diarrhea.  Endocrine: Negative for increased urination.  Genitourinary:  Negative for involuntary urination.  Musculoskeletal:  Positive for joint swelling, myalgias, muscle tenderness and myalgias. Negative for joint pain, gait problem, joint pain, muscle weakness and morning stiffness.  Skin:  Negative for color change, rash, hair loss and sensitivity to sunlight.  Allergic/Immunologic: Negative for susceptible to infections.   Neurological:  Positive for headaches. Negative for dizziness.  Hematological:  Negative for swollen glands.  Psychiatric/Behavioral:  Positive for sleep disturbance. Negative for depressed mood. The patient is not nervous/anxious.     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   Excessive cerumen in ear canal, right 08/04/2021   Itchy skin 05/30/2020   Diaphoresis 05/12/2018   Trigger finger, right middle finger 09/05/2017   History of adenomatous polyp of colon 08/01/2017   Primary osteoarthritis of both knees 03/10/2017   Primary osteoarthritis of both hands 03/09/2017   Primary osteoarthritis of both feet 03/09/2017   Rheumatoid factor positive 03/09/2017   Advanced directives, counseling/discussion 01/26/2017   nonobstructive CAD (coronary artery disease) 12/01/2015   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 Dolphus   CAD (coronary artery disease)    nonobstructive   Cataract 03/30/07   10/16/09   Depression    Exertional dyspnea 08/29/2012  GERD (gastroesophageal reflux disease)    Hammer toe    surgery x2, second with Dr. Harden- only other option amputatoin   History of kidney stones    HLD (hyperlipidemia)    HTN (hypertension)    PNEUMONIA 07/04/2007   Qualifier: Diagnosis of  By: Amon MD, Aloysius BRAVO.    Syncope and collapse 08/29/2012   first time ever    Family History  Problem Relation Age of Onset   Heart disease Mother        brother,  father   Diabetes Mother        sister, brother   Vision loss Mother    Suicidality Father        4   Depression Father    Heart disease Father    Vision loss Father    Arthritis Sister    Diabetes Sister    Hearing loss Sister    Heart attack Sister    Diabetes Sister    Coronary artery disease Brother    Colon cancer Brother    Stroke Brother    ADD / ADHD Brother    Anxiety disorder Brother    Cancer Brother    Depression Brother    Diabetes Brother    Alcoholism Brother    Alcohol abuse Brother    Depression Brother    Diabetes Brother    Vision loss Brother    Ovarian cancer Daughter    Esophageal cancer Neg Hx    Rectal cancer Neg Hx    Stomach cancer Neg Hx    Liver cancer Neg Hx    Past Surgical History:  Procedure Laterality Date   ABDOMINAL HYSTERECTOMY  08/27/1969   APPENDECTOMY  ~ 1970   CATARACT EXTRACTION W/ INTRAOCULAR LENS  IMPLANT, BILATERAL  ~ 2009/12/21   bilateral   CYSTOSCOPY W/ RETROGRADES Left 08/14/2024   Procedure: CYSTOSCOPY, WITH RETROGRADE PYELOGRAM;  Surgeon: Watt Rush, MD;  Location: [facility] OR;  Service: Urology;  Laterality: Left;   CYSTOSCOPY/URETEROSCOPY/HOLMIUM LASER/STENT PLACEMENT Left 08/14/2024   Procedure: CYSTOSCOPY/URETEROSCOPY/HOLMIUM LASER/STENT PLACEMENT;  Surgeon: Watt Rush, MD;  Location: [facility] OR;  Service: Urology;  Laterality: Left;   EXTRACORPOREAL SHOCK WAVE LITHOTRIPSY Left 07/16/2024   Procedure: LITHOTRIPSY, ESWL;  Surgeon: Watt Rush, MD;  Location: [facility] ORS;  Service: Urology;  Laterality: Left;   EYE SURGERY  03/30/07   10/16/09   LEFT HEART CATH AND CORONARY ANGIOGRAPHY N/A 08/01/2023   Procedure: LEFT HEART CATH AND CORONARY ANGIOGRAPHY;  Surgeon: Verlin Lonni BIRCH, MD;  Location: [facility] INVASIVE CV LAB;  Service: Cardiovascular;  Laterality: N/A;   TONSILLECTOMY AND ADENOIDECTOMY  08/27/1949   VEIN LIGATION AND STRIPPING  12/27/2010   bilaterally   Social History   Tobacco Use   Smoking status: Former    Current  packs/day: 0.00    Average packs/day: 1 pack/day for 20.0 years (20.0 ttl pk-yrs)    Types: Cigarettes    Start date: 12/27/1968    Quit date: 12/27/1988    Years since quitting: 35.9    Passive exposure: Never   Smokeless tobacco: Never  Vaping Use   Vaping status: Never Used  Substance Use Topics   Alcohol use: No   Drug use: No   Social History   Social History Narrative   Widowed 2013-10-21. Daughter died 2012-12-21. 2 living children. 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      Hobbies: gardening, time with dog, time with grandkids     Immunization History  Administered Date(s) Administered    sv, Bivalent, Protein Subunit Rsvpref,pf (Abrysvo) 09/17/2022   Fluad Quad(high Dose 65+) 09/25/2019, 10/07/2020, 09/17/2022, 08/06/2024   Fluad Trivalent(High Dose 65+) 09/27/2023   INFLUENZA, HIGH DOSE SEASONAL PF 09/10/2014, 10/10/2015, 08/24/2016, 09/28/2018, 09/17/2022   Influenza-Unspecified 11/12/2015, 09/25/2019, 09/03/2021   PFIZER Comirnaty(Gray Top)Covid-19 Tri-Sucrose Vaccine 02/17/2021, 09/22/2022   PFIZER(Purple Top)SARS-COV-2 Vaccination 01/21/2020, 02/11/2020, 08/19/2020, 11/13/2021, 09/29/2024   PNEUMOCOCCAL CONJUGATE-20 09/22/2022   PPD Test 05/06/2021   Pfizer Covid-19 Vaccine Bivalent Booster 71yrs & up 11/13/2021   Pfizer(Comirnaty)Fall Seasonal Vaccine 12 years and older 09/27/2022   Pneumococcal Conjugate-13 08/24/2016   Pneumococcal Polysaccharide-23 12/29/1999   Respiratory Syncytial Virus Vaccine,Recomb Aduvanted(Arexvy) 09/17/2022   Td 12/28/2005, 10/26/2016   Tdap 09/22/2011, 08/06/2024   Zoster Recombinant(Shingrix) 12/01/2021, 02/12/2022     Objective: Vital Signs: BP 125/74   Pulse 66   Temp 98.5 F (36.9 C)   Resp 15   Ht 5' 3 (1.6 m)   Wt 186 lb 3.2 oz (84.5 kg)   BMI 32.98  kg/m    Physical Exam Vitals and nursing note reviewed.  Constitutional:      Appearance: She is well-developed.  HENT:     Head: Normocephalic and atraumatic.  Eyes:     Conjunctiva/sclera: Conjunctivae normal.  Cardiovascular:     Rate and Rhythm: Normal rate and regular rhythm.     Heart sounds: Normal heart sounds.  Pulmonary:     Effort: Pulmonary effort is normal.     Breath sounds: Normal breath sounds.  Abdominal:     General: Bowel sounds are normal.     Palpations: Abdomen is soft.  Musculoskeletal:     Cervical back: Normal range of motion.  Lymphadenopathy:     Cervical: No cervical adenopathy.  Skin:    General: Skin is warm and dry.     Capillary Refill: Capillary refill takes less than 2 seconds.  Neurological:     Mental Status: She is alert and oriented to person, place, and time.  Psychiatric:        Behavior: Behavior normal.      Musculoskeletal Exam: Cervical spine was in good range of motion.  Thoracic kyphosis was noted.  There was no tenderness over thoracic or lumbar spine.  Shoulders elbow joints, wrist joints with good range of motion.  She had bilateral CMC, PIP and DIP thickening with subluxation of some of the DIP joints.  Hip joints and knee joints in good range of motion left knee warmth swelling effusion.  There was no tenderness over ankles or MTPs.  CDAI Exam: CDAI Score: -- Patient Global: --; Provider Global: -- Swollen: --; Tender: -- Joint Exam 11/19/2024   No joint exam has been documented for this visit   There is currently no information documented on the homunculus. Go to the Rheumatology activity and complete the homunculus joint exam.  Investigation: No additional findings.  Imaging: US  Guided Needle Placement Result Date: 11/19/2024 Ultrasound guided injection is preferred based studies that show increased duration, increased effect, greater accuracy, decreased procedural pain, increased response rate, and decreased  cost with ultrasound guided versus blind injection.   Verbal informed consent obtained.  Time-out conducted.  Noted no overlying erythema, induration, or other signs of local infection. Ultrasound-guided CMC injection.  After sterile prep  with Betadine, injected 0.5 mL of 1% lidocaine  and 20 mg Kenalog  using a 27-gauge needle, in  the [facility] joint.    US  Guided Needle Placement Result Date: 11/19/2024 Ultrasound guided injection is preferred based studies that show increased duration, increased effect, greater accuracy, decreased procedural pain, increased response rate, and decreased cost with ultrasound guided versus blind injection.   Verbal informed consent obtained.  Time-out conducted.  Noted no overlying erythema, induration, or other signs of local infection. Ultrasound-guided CMC injection.  After sterile prep with Betadine, injected 0.5 mL of 1% lidocaine  and 20 mg Kenalog  using a 27-gauge needle, in  the [facility] joint.     Recent Labs: Lab Results  Component Value Date   WBC 5.8 08/29/2024   HGB 12.9 08/29/2024   PLT 250.0 08/29/2024   NA 141 08/29/2024   K 3.7 08/29/2024   CL 106 08/29/2024   CO2 28 08/29/2024   GLUCOSE 89 08/29/2024   BUN 15 08/29/2024   CREATININE 0.77 08/29/2024   BILITOT 0.8 08/29/2024   ALKPHOS 62 08/29/2024   AST 21 08/29/2024   ALT 15 08/29/2024   PROT 6.9 08/29/2024   ALBUMIN 4.0 08/29/2024   CALCIUM  9.3 08/29/2024   GFRAA >60 04/21/2020    Speciality Comments: No specialty comments available.  Procedures:   Ultrasound guided injection is preferred based studies that show increased duration, increased effect, greater accuracy, decreased procedural pain, increased response rate, and decreased cost with ultrasound guided versus blind injection.   Verbal informed consent obtained.  Time-out conducted.  Noted no overlying erythema, induration, or other signs of local infection. Ultrasound-guided CMC injection.  After sterile prep with Betadine, injected 0.5  mL of 1% lidocaine  and 20 mg Kenalog  using a 27-gauge needle, in  the [facility] joint.    Small Joint Inj: bilateral thumb CMC on 11/19/2024 8:33 AM Indications: pain Details: 27 G needle, ultrasound-guided radial approach  Spinal Needle: No  Medications (Right): 0.5 mL lidocaine  1 %; 20 mg triamcinolone  acetonide 40 MG/ML Aspirate (Right): 0 mL Medications (Left): 0.5 mL lidocaine  1 %; 20 mg triamcinolone  acetonide 40 MG/ML Aspirate (Left): 0 mL Outcome: tolerated well, no immediate complications  Risk of infection, tendon injury, nerve injury, dermal atrophy and hypopigmentation were discussed. Procedure, treatment alternatives, risks and benefits explained, specific risks discussed. Consent was given by the patient. Immediately prior to procedure a time out was called to verify the correct patient, procedure, equipment, support staff and site/side marked as required. Patient was prepped and draped in the usual sterile fashion.     Allergies: Clindamycin/lincomycin, Oxycodone -acetaminophen , and Trazodone  and nefazodone   Assessment / Plan:     Visit Diagnoses: Arthritis of carpometacarpal (CMC) joint of both thumbs -patient is experiencing pain and discomfort in her bilateral CMC joints.  She states this discomfort started about 2 months ago.  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  She requested bilateral CMC injections.  She had good results from previous CMC injections given in March.  After informed consent was obtained and side effects were discussed bilateral CMC joints were injected with lidocaine  and Kenalog  as described above.  She tolerated the procedure well.  Postprocedure instructions were given.  Plan: US  Guided Needle Placement, US  Guided Needle Placement  Primary osteoarthritis of both hands-she has bilateral CMC, PIP and DIP arthritis with subluxation of some of the DIP joints.  Joint protection muscle strengthening was  discussed.  Primary osteoarthritis of both knees-she  has some discomfort in her knee joints which is manageable.  She goes to the gym on a regular basis and exercises.  Primary osteoarthritis of both feet-fitting shoes were advised.  Rheumatoid factor positive-no synovitis was noted on the examination.  Other medical problems are listed as follows:  Primary insomnia  History of hyperlipidemia  History of hypertension  History of coronary artery disease  History of gastroesophageal reflux (GERD)  History of adenomatous polyp of colon  History of depression  Orders: Orders Placed This Encounter  Procedures   Small Joint Inj   US  Guided Needle Placement   US  Guided Needle Placement   No orders of the defined types were placed in this encounter.    Follow-Up Instructions: Return in about 6 months (around 05/19/2025) for Osteoarthritis.   Maya Nash, MD  Note - This record has been created using Animal nutritionist.  Chart creation errors have been sought, but may not always  have been located. Such creation errors do not reflect on  the standard of medical care.

## 2024-11-19 ENCOUNTER — Ambulatory Visit

## 2024-11-19 ENCOUNTER — Ambulatory Visit: Attending: Rheumatology | Admitting: Rheumatology

## 2024-11-19 ENCOUNTER — Encounter: Payer: Self-pay | Admitting: Rheumatology

## 2024-11-19 VITALS — BP 125/74 | HR 66 | Temp 98.5°F | Resp 15 | Ht 63.0 in | Wt 186.2 lb

## 2024-11-19 DIAGNOSIS — M19042 Primary osteoarthritis, left hand: Secondary | ICD-10-CM | POA: Diagnosis not present

## 2024-11-19 DIAGNOSIS — R7689 Other specified abnormal immunological findings in serum: Secondary | ICD-10-CM

## 2024-11-19 DIAGNOSIS — M19071 Primary osteoarthritis, right ankle and foot: Secondary | ICD-10-CM

## 2024-11-19 DIAGNOSIS — Z8659 Personal history of other mental and behavioral disorders: Secondary | ICD-10-CM | POA: Diagnosis not present

## 2024-11-19 DIAGNOSIS — Z8679 Personal history of other diseases of the circulatory system: Secondary | ICD-10-CM | POA: Diagnosis not present

## 2024-11-19 DIAGNOSIS — Z8639 Personal history of other endocrine, nutritional and metabolic disease: Secondary | ICD-10-CM

## 2024-11-19 DIAGNOSIS — M180 Bilateral primary osteoarthritis of first carpometacarpal joints: Secondary | ICD-10-CM | POA: Diagnosis not present

## 2024-11-19 DIAGNOSIS — Z860101 Personal history of adenomatous and serrated colon polyps: Secondary | ICD-10-CM

## 2024-11-19 DIAGNOSIS — M170 Bilateral primary osteoarthritis of knee: Secondary | ICD-10-CM | POA: Diagnosis not present

## 2024-11-19 DIAGNOSIS — Z8719 Personal history of other diseases of the digestive system: Secondary | ICD-10-CM | POA: Diagnosis not present

## 2024-11-19 DIAGNOSIS — M19041 Primary osteoarthritis, right hand: Secondary | ICD-10-CM

## 2024-11-19 DIAGNOSIS — M19072 Primary osteoarthritis, left ankle and foot: Secondary | ICD-10-CM

## 2024-11-19 DIAGNOSIS — F5101 Primary insomnia: Secondary | ICD-10-CM | POA: Diagnosis not present

## 2024-11-19 MED ORDER — TRIAMCINOLONE ACETONIDE 40 MG/ML IJ SUSP
20.0000 mg | INTRAMUSCULAR | Status: AC | PRN
  Administered 2024-11-19: 20 mg via INTRA_ARTICULAR

## 2024-11-19 MED ORDER — LIDOCAINE HCL 1 % IJ SOLN
0.5000 mL | INTRAMUSCULAR | Status: AC | PRN
  Administered 2024-11-19: .5 mL

## 2024-11-27 ENCOUNTER — Ambulatory Visit (INDEPENDENT_AMBULATORY_CARE_PROVIDER_SITE_OTHER): Payer: Medicare HMO

## 2024-12-28 ENCOUNTER — Encounter: Payer: Self-pay | Admitting: Family Medicine

## 2025-01-17 ENCOUNTER — Other Ambulatory Visit: Payer: Self-pay | Admitting: Medical Genetics

## 2025-01-18 ENCOUNTER — Other Ambulatory Visit (HOSPITAL_COMMUNITY)
Admission: RE | Admit: 2025-01-18 | Discharge: 2025-01-18 | Disposition: A | Payer: Self-pay | Source: Ambulatory Visit | Attending: Medical Genetics | Admitting: Medical Genetics

## 2025-01-23 ENCOUNTER — Ambulatory Visit (INDEPENDENT_AMBULATORY_CARE_PROVIDER_SITE_OTHER)

## 2025-01-23 VITALS — BP 150/74 | HR 67 | Ht 63.0 in | Wt 186.0 lb

## 2025-01-23 DIAGNOSIS — Z0000 Encounter for general adult medical examination without abnormal findings: Secondary | ICD-10-CM | POA: Diagnosis not present

## 2025-01-23 NOTE — Patient Instructions (Signed)
 Ms. Rutten,  Thank you for taking the time for your Medicare Wellness Visit. I appreciate your continued commitment to your health goals. Please review the care plan we discussed, and feel free to reach out if I can assist you further.  Please note that Annual Wellness Visits do not include a physical exam. Some assessments may be limited, especially if the visit was conducted virtually. If needed, we may recommend an in-person follow-up with your provider.  Ongoing Care Seeing your primary care provider every 3 to 6 months helps us  monitor your health and provide consistent, personalized care.   Referrals If a referral was made during today's visit and you haven't received any updates within two weeks, please contact the referred provider directly to check on the status.  Recommended Screenings:  Health Maintenance  Topic Date Due   Colon Cancer Screening  06/08/2024   COVID-19 Vaccine (8 - Pfizer risk 2025-26 season) 03/30/2025   Medicare Annual Wellness Visit  01/23/2026   DTaP/Tdap/Td vaccine (5 - Td or Tdap) 08/06/2034   Flu Shot  Completed   Osteoporosis screening with Bone Density Scan  Completed   Zoster (Shingles) Vaccine  Completed   Meningitis B Vaccine  Aged Out   Pneumococcal Vaccine for age over 52  Discontinued   Hepatitis C Screening  Discontinued       08/14/2024    6:36 AM  Advanced Directives  Does Patient Have a Medical Advance Directive? Yes  Type of Estate Agent of [place];Living will  Copy of Healthcare Power of Attorney in Chart? No - copy requested    Vision: Annual vision screenings are recommended for early detection of glaucoma, cataracts, and diabetic retinopathy. These exams can also reveal signs of chronic conditions such as diabetes and high blood pressure.  Dental: Annual dental screenings help detect early signs of oral cancer, gum disease, and other conditions linked to overall health, including heart disease and  diabetes.  Please see the attached documents for additional preventive care recommendations.

## 2025-01-23 NOTE — Progress Notes (Signed)
 "  Chief Complaint  Patient presents with   Medicare Wellness     Subjective:   [name] is a 84 y.o. female who presents for a Medicare Annual Wellness Visit.  Visit info / Clinical Intake: Medicare Wellness Visit Type:: Subsequent Annual Wellness Visit Persons participating in visit and providing information:: patient Medicare Wellness Visit Mode:: Telephone If telephone:: video declined Since this visit was completed virtually, some vitals may be partially provided or unavailable. Missing vitals are due to the limitations of the virtual format.: Documented vitals are patient reported If Telephone or Video please confirm:: I connected with patient using audio/video enable telemedicine. I verified patient identity with two identifiers, discussed telehealth limitations, and patient agreed to proceed. Patient Location:: home Provider Location:: office Interpreter Needed?: No Pre-visit prep was completed: yes AWV questionnaire completed by patient prior to visit?: no Living arrangements:: (!) lives alone Patient's Overall Health Status Rating: good Typical amount of pain: none Does pain affect daily life?: no Are you currently prescribed opioids?: no  Dietary Habits and Nutritional Risks How many meals a day?: 2 Eats fruit and vegetables daily?: yes Most meals are obtained by: preparing own meals In the last 2 weeks, have you had any of the following?: none Diabetic:: no  Functional Status Activities of Daily Living (to include ambulation/medication): Independent Ambulation: Independent with device- listed below Home Assistive Devices/Equipment: Eyeglasses Medication Administration: Independent Home Management (perform basic housework or laundry): Independent Manage your own finances?: yes Primary transportation is: driving Concerns about vision?: no *vision screening is required for WTM* Concerns about hearing?: (!) yes Uses hearing aids?: (!) yes (only use at  times) Hear whispered voice?: yes  Fall Screening Falls in the past year?: 0 Number of falls in past year: 0 Was there an injury with Fall?: 0 Fall Risk Category Calculator: 0 Patient Fall Risk Level: Low Fall Risk  Fall Risk Patient at Risk for Falls Due to: No Fall Risks Fall risk Follow up: Falls evaluation completed  Home and Transportation Safety: All rugs have non-skid backing?: N/A, no rugs All stairs or steps have railings?: yes Grab bars in the bathtub or shower?: yes Have non-skid surface in bathtub or shower?: (!) no Good home lighting?: yes Regular seat belt use?: yes Hospital stays in the last year:: no  Cognitive Assessment Difficulty concentrating, remembering, or making decisions? : no Will 6CIT or Mini Cog be Completed: yes What year is it?: 0 points What month is it?: 0 points Give patient an address phrase to remember (5 components): [address] [place]  About what time is it?: 0 points Count backwards from 20 to 1: 0 points Say the months of the year in reverse: 0 points Repeat the address phrase from earlier: 0 points 6 CIT Score: 0 points  Advance Directives (For Healthcare) Does Patient Have a Medical Advance Directive?: Yes Does patient want to make changes to medical advance directive?: No - Patient declined Type of Advance Directive: Healthcare Power of Attorney Copy of Healthcare Power of Attorney in Chart?: No - copy requested  Reviewed/Updated  Reviewed/Updated: Reviewed All (Medical, Surgical, Family, Medications, Allergies, Care Teams, Patient Goals)    Allergies (verified) Clindamycin/lincomycin, Oxycodone -acetaminophen , and Trazodone  and nefazodone   Current Medications (verified) Outpatient Encounter Medications as of 01/23/2025  Medication Sig   amLODipine  (NORVASC ) 5 MG tablet Take 1 tablet (5 mg total) by mouth daily.   aspirin  81 MG tablet Take 81 mg by mouth daily.    atorvastatin  (LIPITOR ) 80 MG  tablet Take 1 tablet (80 mg  total) by mouth every evening.   Cholecalciferol (VITAMIN D3) 125 MCG (5000 UT) CAPS Take 5,000 Units by mouth daily.   diclofenac  sodium (VOLTAREN ) 1 % GEL Apply 3 gm to 3 large joints up to 3 times a day.Dispense 3 tubes with 3 refills.   escitalopram  (LEXAPRO ) 20 MG tablet Take 1 tablet (20 mg total) by mouth daily.   ezetimibe  (ZETIA ) 10 MG tablet Take 1 tablet (10 mg total) by mouth daily.   isosorbide  mononitrate (IMDUR ) 60 MG 24 hr tablet Take 1 tablet (60 mg total) by mouth daily.   nitroGLYCERIN  (NITROSTAT ) 0.4 MG SL tablet Place 1 tablet (0.4 mg total) under the tongue every 5 (five) minutes as needed for chest pain.   omeprazole  (PRILOSEC) 40 MG capsule TAKE ONE CAPSULE BY MOUTH TWICE DAILY   tamsulosin  (FLOMAX ) 0.4 MG CAPS capsule TAKE ONE CAPSULE BY MOUTH DAILY   triamcinolone  cream (KENALOG ) 0.1 % Apply 1 Application topically 2 (two) times daily. For 7-10 days maximum   vitamin B-12 (CYANOCOBALAMIN ) 1000 MCG tablet Take 1,000 mcg by mouth daily.   [DISCONTINUED] omeprazole  (PRILOSEC) 20 MG capsule Take 1 capsule (20 mg total) by mouth in the morning and at bedtime.   No facility-administered encounter medications on file as of 01/23/2025.    History: Past Medical History:  Diagnosis Date   Anginal pain 08/29/2012   have had problems w/this   Anxiety 2015   Arthritis 08/29/2012   CAD (coronary artery disease)    nonobstructive   Cataract 2020   10/16/09   Chronic kidney disease 2025   Removed stone   Depression    Exertional dyspnea 08/29/2012   GERD (gastroesophageal reflux disease) 2015   Hammer toe    surgery x2, second with Dr. Harden- only other option amputatoin   History of kidney stones    HLD (hyperlipidemia)    HTN (hypertension)    PNEUMONIA 07/04/2007   Qualifier: Diagnosis of  By: Amon MD, Aloysius BRAVO.    Syncope and collapse 08/29/2012   first time ever   Past Surgical History:  Procedure Laterality Date   ABDOMINAL HYSTERECTOMY  1975   APPENDECTOMY   1960   CATARACT EXTRACTION W/ INTRAOCULAR LENS  IMPLANT, BILATERAL  ~ 2010   bilateral   CYSTOSCOPY W/ RETROGRADES Left 08/14/2024   Procedure: CYSTOSCOPY, WITH RETROGRADE PYELOGRAM;  Surgeon: Watt Rush, MD;  Location: [facility] OR;  Service: Urology;  Laterality: Left;   CYSTOSCOPY/URETEROSCOPY/HOLMIUM LASER/STENT PLACEMENT Left 08/14/2024   Procedure: CYSTOSCOPY/URETEROSCOPY/HOLMIUM LASER/STENT PLACEMENT;  Surgeon: Watt Rush, MD;  Location: [facility] OR;  Service: Urology;  Laterality: Left;   EXTRACORPOREAL SHOCK WAVE LITHOTRIPSY Left 07/16/2024   Procedure: LITHOTRIPSY, ESWL;  Surgeon: Watt Rush, MD;  Location: [facility] ORS;  Service: Urology;  Laterality: Left;   EYE SURGERY  2020   10/16/09   LEFT HEART CATH AND CORONARY ANGIOGRAPHY N/A 08/01/2023   Procedure: LEFT HEART CATH AND CORONARY ANGIOGRAPHY;  Surgeon: Verlin Lonni BIRCH, MD;  Location: [facility] INVASIVE CV LAB;  Service: Cardiovascular;  Laterality: N/A;   TONSILLECTOMY AND ADENOIDECTOMY  08/27/1949   VEIN LIGATION AND STRIPPING  12/27/2010   bilaterally   Family History  Problem Relation Age of Onset   Heart disease Mother        brother, father   Diabetes Mother        sister, brother   Vision loss Mother    Suicidality Father        2  Depression Father    Heart disease Father    Vision loss Father    Arthritis Sister    Diabetes Sister    Hearing loss Sister    Heart attack Sister    Diabetes Sister    Coronary artery disease Brother    Colon cancer Brother    Stroke Brother    ADD / ADHD Brother    Anxiety disorder Brother    Cancer Brother    Depression Brother    Diabetes Brother    Alcoholism Brother    Alcohol abuse Brother    Depression Brother    Diabetes Brother    Vision loss Brother    Ovarian cancer Daughter    Esophageal cancer Neg Hx    Rectal cancer Neg Hx    Stomach cancer Neg Hx    Liver cancer Neg Hx    Social History   Occupational History   Occupation: retired  Tobacco Use   Smoking  status: Former    Current packs/day: 0.00    Average packs/day: 1 pack/day for 20.0 years (20.0 ttl pk-yrs)    Types: Cigarettes    Start date: 12/27/1968    Quit date: 12/27/1988    Years since quitting: 36.0    Passive exposure: Never   Smokeless tobacco: Never  Vaping Use   Vaping status: Never Used  Substance and Sexual Activity   Alcohol use: No   Drug use: No   Sexual activity: Not Currently    Birth control/protection: Post-menopausal   Tobacco Counseling Counseling given: Not Answered  SDOH Screenings   Food Insecurity: No Food Insecurity (01/23/2025)  Housing: Low Risk (01/23/2025)  Transportation Needs: No Transportation Needs (01/23/2025)  Utilities: Not At Risk (01/23/2025)  Alcohol Screen: Low Risk (08/22/2024)  Depression (PHQ2-9): Low Risk (01/23/2025)  Financial Resource Strain: Low Risk (08/22/2024)  Physical Activity: Sufficiently Active (01/23/2025)  Social Connections: Moderately Isolated (01/23/2025)  Stress: No Stress Concern Present (01/23/2025)  Tobacco Use: Medium Risk (01/23/2025)  Health Literacy: Adequate Health Literacy (01/23/2025)   See flowsheets for full screening details  Depression Screen PHQ 2 & 9 Depression Scale- Over the past 2 weeks, how often have you been bothered by any of the following problems? Little interest or pleasure in doing things: 0 Feeling down, depressed, or hopeless (PHQ Adolescent also includes...irritable): 0 PHQ-2 Total Score: 0 Trouble falling or staying asleep, or sleeping too much: 2 Feeling tired or having little energy: 0 Poor appetite or overeating (PHQ Adolescent also includes...weight loss): 0 Feeling bad about yourself - or that you are a failure or have let yourself or your family down: 0 Trouble concentrating on things, such as reading the newspaper or watching television (PHQ Adolescent also includes...like school work): 0 Moving or speaking so slowly that other people could have noticed. Or the opposite - being so  fidgety or restless that you have been moving around a lot more than usual: 0 Thoughts that you would be better off dead, or of hurting yourself in some way: 0 PHQ-9 Total Score: 2 If you checked off any problems, how difficult have these problems made it for you to do your work, take care of things at home, or get along with other people?: Not difficult at all     Goals Addressed               This Visit's Progress     weight loss to 175LBS (pt-stated)  Objective:    Today's Vitals   01/23/25 1017 01/23/25 1033  BP: (!) 159/83 (!) 150/74  Pulse: 67   Weight: 186 lb (84.4 kg)   Height: 5' 3 (1.6 m)    Body mass index is 32.95 kg/m.  Hearing/Vision screen No results found. Immunizations and Health Maintenance Health Maintenance  Topic Date Due   COVID-19 Vaccine (8 - Pfizer risk 2025-26 season) 03/30/2025   Medicare Annual Wellness (AWV)  01/23/2026   DTaP/Tdap/Td (5 - Td or Tdap) 08/06/2034   Influenza Vaccine  Completed   Bone Density Scan  Completed   Zoster Vaccines- Shingrix  Completed   Meningococcal B Vaccine  Aged Out   Pneumococcal Vaccine: 50+ Years  Discontinued   Colonoscopy  Discontinued   Hepatitis C Screening  Discontinued        Assessment/Plan:  This is a routine wellness examination for [place].  Patient Care Team: Katrinka Garnette KIDD, MD as PCP - General (Family Medicine) Pietro Redell RAMAN, MD as PCP - Cardiology (Cardiology) Pietro Redell RAMAN, MD as Consulting Physician (Cardiology) Dolphus Reiter, MD as Consulting Physician (Rheumatology) Leslee Reusing, MD as Consulting Physician (Ophthalmology) Porter Andrez SAUNDERS, PA-C (Inactive) as Physician Assistant (Dermatology)  I have personally reviewed and noted the following in the patients chart:   Medical and social history Use of alcohol, tobacco or illicit drugs  Current medications and supplements including opioid prescriptions. Functional ability and  status Nutritional status Physical activity Advanced directives List of other physicians Hospitalizations, surgeries, and ER visits in previous 12 months Vitals Screenings to include cognitive, depression, and falls Referrals and appointments  No orders of the defined types were placed in this encounter.  In addition, I have reviewed and discussed with patient certain preventive protocols, quality metrics, and best practice recommendations. A written personalized care plan for preventive services as well as general preventive health recommendations were provided to patient.   Ellouise VEAR Haws, LPN   [id number]/71/7973   Return in about 1 year (around 01/27/2026).  After Visit Summary: (MyChart) Due to this being a telephonic visit, the after visit summary with patients personalized plan was offered to patient via MyChart   Nurse Notes: Patient advised to keep follow-up appointment with PCP (02/27/25) HM Addressed: Pt is completing record of Blood pressure readings stating it has been elevated this past week  "

## 2025-02-27 ENCOUNTER — Ambulatory Visit: Admitting: Family Medicine

## 2025-03-07 ENCOUNTER — Ambulatory Visit: Admitting: Rheumatology

## 2026-01-28 ENCOUNTER — Ambulatory Visit
